# Patient Record
Sex: Female | Born: 1945 | Race: White | Hispanic: No | Marital: Single | Smoking: Former smoker
Health system: Southern US, Community
[De-identification: ages and names within clinical notes are randomized; demographics above are authoritative.]

## PROBLEM LIST (undated history)

## (undated) DIAGNOSIS — E039 Hypothyroidism, unspecified: Secondary | ICD-10-CM

## (undated) DIAGNOSIS — E785 Hyperlipidemia, unspecified: Secondary | ICD-10-CM

## (undated) DIAGNOSIS — I10 Essential (primary) hypertension: Secondary | ICD-10-CM

## (undated) DIAGNOSIS — F32A Depression, unspecified: Secondary | ICD-10-CM

## (undated) DIAGNOSIS — F419 Anxiety disorder, unspecified: Secondary | ICD-10-CM

## (undated) DIAGNOSIS — F329 Major depressive disorder, single episode, unspecified: Secondary | ICD-10-CM

## (undated) DIAGNOSIS — E119 Type 2 diabetes mellitus without complications: Secondary | ICD-10-CM

## (undated) DIAGNOSIS — D649 Anemia, unspecified: Secondary | ICD-10-CM

## (undated) DIAGNOSIS — J45909 Unspecified asthma, uncomplicated: Secondary | ICD-10-CM

## (undated) HISTORY — PX: OTHER SURGICAL HISTORY: SHX169

## (undated) HISTORY — PX: APPENDECTOMY: SHX54

## (undated) HISTORY — PX: TONSILLECTOMY: SUR1361

---

## 2004-08-14 DIAGNOSIS — I10 Essential (primary) hypertension: Secondary | ICD-10-CM | POA: Insufficient documentation

## 2004-08-14 DIAGNOSIS — E039 Hypothyroidism, unspecified: Secondary | ICD-10-CM | POA: Insufficient documentation

## 2004-11-17 ENCOUNTER — Ambulatory Visit: Payer: Self-pay | Admitting: Internal Medicine

## 2004-12-13 ENCOUNTER — Ambulatory Visit: Payer: Self-pay | Admitting: Internal Medicine

## 2005-01-13 ENCOUNTER — Ambulatory Visit: Payer: Self-pay | Admitting: Internal Medicine

## 2005-05-23 ENCOUNTER — Ambulatory Visit: Payer: Self-pay | Admitting: Internal Medicine

## 2005-06-15 ENCOUNTER — Ambulatory Visit: Payer: Self-pay | Admitting: Internal Medicine

## 2005-08-30 ENCOUNTER — Ambulatory Visit: Payer: Self-pay | Admitting: Nurse Practitioner

## 2006-04-16 ENCOUNTER — Ambulatory Visit: Payer: Self-pay | Admitting: *Deleted

## 2006-08-20 DIAGNOSIS — F329 Major depressive disorder, single episode, unspecified: Secondary | ICD-10-CM | POA: Insufficient documentation

## 2007-07-11 ENCOUNTER — Ambulatory Visit: Payer: Self-pay

## 2013-06-25 DIAGNOSIS — E669 Obesity, unspecified: Secondary | ICD-10-CM | POA: Insufficient documentation

## 2013-10-24 ENCOUNTER — Inpatient Hospital Stay: Payer: Self-pay | Admitting: Internal Medicine

## 2013-10-24 LAB — CBC WITH DIFFERENTIAL/PLATELET
Basophil #: 0 10*3/uL (ref 0.0–0.1)
Basophil %: 0.1 %
Eosinophil #: 0 10*3/uL (ref 0.0–0.7)
Eosinophil %: 0 %
HCT: 34.7 % — ABNORMAL LOW (ref 35.0–47.0)
HGB: 12.5 g/dL (ref 12.0–16.0)
Lymphocyte #: 1.4 10*3/uL (ref 1.0–3.6)
Lymphocyte %: 5.9 %
MCH: 28.6 pg (ref 26.0–34.0)
MCHC: 35.9 g/dL (ref 32.0–36.0)
MCV: 80 fL (ref 80–100)
Monocyte #: 1.6 x10 3/mm — ABNORMAL HIGH (ref 0.2–0.9)
Monocyte %: 6.9 %
Neutrophil #: 20.2 10*3/uL — ABNORMAL HIGH (ref 1.4–6.5)
Neutrophil %: 87.1 %
Platelet: 328 10*3/uL (ref 150–440)
RBC: 4.36 10*6/uL (ref 3.80–5.20)
RDW: 13.6 % (ref 11.5–14.5)
WBC: 23.2 10*3/uL — ABNORMAL HIGH (ref 3.6–11.0)

## 2013-10-24 LAB — URINALYSIS, COMPLETE
Bacteria: NONE SEEN
Bilirubin,UR: NEGATIVE
Blood: NEGATIVE
Glucose,UR: 50 mg/dL (ref 0–75)
Leukocyte Esterase: NEGATIVE
Nitrite: NEGATIVE
Ph: 6 (ref 4.5–8.0)
Protein: >=500
RBC,UR: 6 /HPF (ref 0–5)
Specific Gravity: 1.018 (ref 1.003–1.030)
Squamous Epithelial: <1
WBC UR: 3 /HPF (ref 0–5)

## 2013-10-24 LAB — TSH: Thyroid Stimulating Horm: 1.31 u[IU]/mL

## 2013-10-24 LAB — COMPREHENSIVE METABOLIC PANEL
Albumin: 4 g/dL (ref 3.4–5.0)
Alkaline Phosphatase: 62 U/L
Anion Gap: 8 (ref 7–16)
BUN: 10 mg/dL (ref 7–18)
Bilirubin,Total: 1.2 mg/dL — ABNORMAL HIGH (ref 0.2–1.0)
Calcium, Total: 8.1 mg/dL — ABNORMAL LOW (ref 8.5–10.1)
Chloride: 65 mmol/L — ABNORMAL LOW (ref 98–107)
Co2: 30 mmol/L (ref 21–32)
Creatinine: 0.66 mg/dL (ref 0.60–1.30)
EGFR (African American): >60
EGFR (Non-African Amer.): >60
Glucose: 146 mg/dL — ABNORMAL HIGH (ref 65–99)
Osmolality: 212 (ref 275–301)
Potassium: 2.7 mmol/L — ABNORMAL LOW (ref 3.5–5.1)
SGOT(AST): 61 U/L — ABNORMAL HIGH (ref 15–37)
SGPT (ALT): 56 U/L (ref 12–78)
Sodium: 103 mmol/L — CL (ref 136–145)
Total Protein: 7.2 g/dL (ref 6.4–8.2)

## 2013-10-24 LAB — LIPASE, BLOOD: Lipase: 663 U/L — ABNORMAL HIGH (ref 73–393)

## 2013-10-24 LAB — CK: CK, Total: 962 U/L — ABNORMAL HIGH (ref 21–215)

## 2013-10-24 LAB — TROPONIN I: Troponin-I: <0.02 ng/mL

## 2013-10-24 LAB — MAGNESIUM: Magnesium: 1 mg/dL — ABNORMAL LOW

## 2013-10-25 LAB — CBC WITH DIFFERENTIAL/PLATELET
Basophil #: 0.1 10*3/uL (ref 0.0–0.1)
Basophil %: 0.4 %
Eosinophil #: 0.1 10*3/uL (ref 0.0–0.7)
Eosinophil %: 0.3 %
HCT: 32.1 % — ABNORMAL LOW (ref 35.0–47.0)
HGB: 11.6 g/dL — ABNORMAL LOW (ref 12.0–16.0)
Lymphocyte #: 1.6 10*3/uL (ref 1.0–3.6)
Lymphocyte %: 9.6 %
MCH: 28.7 pg (ref 26.0–34.0)
MCHC: 36.1 g/dL — ABNORMAL HIGH (ref 32.0–36.0)
MCV: 80 fL (ref 80–100)
Monocyte #: 1.7 x10 3/mm — ABNORMAL HIGH (ref 0.2–0.9)
Monocyte %: 10 %
Neutrophil #: 13.7 10*3/uL — ABNORMAL HIGH (ref 1.4–6.5)
Neutrophil %: 79.7 %
Platelet: 302 10*3/uL (ref 150–440)
RBC: 4.04 10*6/uL (ref 3.80–5.20)
RDW: 13.9 % (ref 11.5–14.5)
WBC: 17.2 10*3/uL — ABNORMAL HIGH (ref 3.6–11.0)

## 2013-10-25 LAB — COMPREHENSIVE METABOLIC PANEL
Albumin: 3.3 g/dL — ABNORMAL LOW (ref 3.4–5.0)
Alkaline Phosphatase: 55 U/L
Anion Gap: 12 (ref 7–16)
BUN: 9 mg/dL (ref 7–18)
Bilirubin,Total: 1 mg/dL (ref 0.2–1.0)
Calcium, Total: 7.9 mg/dL — ABNORMAL LOW (ref 8.5–10.1)
Chloride: 71 mmol/L — ABNORMAL LOW (ref 98–107)
Co2: 25 mmol/L (ref 21–32)
Creatinine: 0.65 mg/dL (ref 0.60–1.30)
EGFR (African American): >60
EGFR (Non-African Amer.): >60
Glucose: 117 mg/dL — ABNORMAL HIGH (ref 65–99)
Osmolality: 220 (ref 275–301)
Potassium: 3.4 mmol/L — ABNORMAL LOW (ref 3.5–5.1)
SGOT(AST): 75 U/L — ABNORMAL HIGH (ref 15–37)
SGPT (ALT): 50 U/L (ref 12–78)
Sodium: 108 mmol/L — CL (ref 136–145)
Total Protein: 6.4 g/dL (ref 6.4–8.2)

## 2013-10-25 LAB — SODIUM
Sodium: 104 mmol/L — CL (ref 136–145)
Sodium: 109 mmol/L — CL (ref 136–145)
Sodium: 109 mmol/L — CL (ref 136–145)
Sodium: 110 mmol/L — CL (ref 136–145)
Sodium: 112 mmol/L — CL (ref 136–145)

## 2013-10-25 LAB — LIPASE, BLOOD: Lipase: 644 U/L — ABNORMAL HIGH (ref 73–393)

## 2013-10-25 LAB — OSMOLALITY, URINE: Osmolality: 138 mOsm/kg

## 2013-10-25 LAB — OSMOLALITY: Osmolality: 228 mOsm/kg — CL (ref 280–301)

## 2013-10-25 LAB — URIC ACID: Uric Acid: 3 mg/dL (ref 2.6–6.0)

## 2013-10-26 LAB — CBC WITH DIFFERENTIAL/PLATELET
Basophil #: 0 10*3/uL (ref 0.0–0.1)
Basophil %: 0.5 %
Eosinophil #: 0 10*3/uL (ref 0.0–0.7)
Eosinophil %: 0.3 %
HCT: 35.3 % (ref 35.0–47.0)
HGB: 12.2 g/dL (ref 12.0–16.0)
Lymphocyte #: 2.1 10*3/uL (ref 1.0–3.6)
Lymphocyte %: 19.6 %
MCH: 28.7 pg (ref 26.0–34.0)
MCHC: 34.6 g/dL (ref 32.0–36.0)
MCV: 83 fL (ref 80–100)
Monocyte #: 1.2 x10 3/mm — ABNORMAL HIGH (ref 0.2–0.9)
Monocyte %: 11.5 %
Neutrophil #: 7.2 10*3/uL — ABNORMAL HIGH (ref 1.4–6.5)
Neutrophil %: 68.1 %
Platelet: 294 10*3/uL (ref 150–440)
RBC: 4.25 10*6/uL (ref 3.80–5.20)
RDW: 14.2 % (ref 11.5–14.5)
WBC: 10.6 10*3/uL (ref 3.6–11.0)

## 2013-10-26 LAB — BASIC METABOLIC PANEL
Anion Gap: 3 — ABNORMAL LOW (ref 7–16)
BUN: 6 mg/dL — ABNORMAL LOW (ref 7–18)
Calcium, Total: 8 mg/dL — ABNORMAL LOW (ref 8.5–10.1)
Chloride: 86 mmol/L — ABNORMAL LOW (ref 98–107)
Co2: 26 mmol/L (ref 21–32)
Creatinine: 0.68 mg/dL (ref 0.60–1.30)
EGFR (African American): >60
EGFR (Non-African Amer.): >60
Glucose: 194 mg/dL — ABNORMAL HIGH (ref 65–99)
Osmolality: 236 (ref 275–301)
Potassium: 3.8 mmol/L (ref 3.5–5.1)
Sodium: 115 mmol/L — CL (ref 136–145)

## 2013-10-26 LAB — SODIUM
Sodium: 123 mmol/L — ABNORMAL LOW (ref 136–145)
Sodium: 121 mmol/L — ABNORMAL LOW (ref 136–145)
Sodium: 124 mmol/L — ABNORMAL LOW (ref 136–145)
Sodium: 123 mmol/L — ABNORMAL LOW (ref 136–145)

## 2013-10-26 LAB — MAGNESIUM: Magnesium: 3.5 mg/dL — ABNORMAL HIGH

## 2013-10-27 LAB — SODIUM
Sodium: 123 mmol/L — ABNORMAL LOW (ref 136–145)
Sodium: 124 mmol/L — ABNORMAL LOW (ref 136–145)
Sodium: 124 mmol/L — ABNORMAL LOW (ref 136–145)
Sodium: 125 mmol/L — ABNORMAL LOW (ref 136–145)
Sodium: 125 mmol/L — ABNORMAL LOW (ref 136–145)
Sodium: 126 mmol/L — ABNORMAL LOW (ref 136–145)

## 2013-10-27 LAB — CLOSTRIDIUM DIFFICILE(ARMC)

## 2013-10-28 LAB — SODIUM
Sodium: 128 mmol/L — ABNORMAL LOW (ref 136–145)
Sodium: 129 mmol/L — ABNORMAL LOW (ref 136–145)

## 2013-10-29 LAB — BASIC METABOLIC PANEL
Anion Gap: 2 — ABNORMAL LOW (ref 7–16)
Anion Gap: 4 — ABNORMAL LOW (ref 7–16)
BUN: 10 mg/dL (ref 7–18)
BUN: 9 mg/dL (ref 7–18)
Calcium, Total: 8.9 mg/dL (ref 8.5–10.1)
Calcium, Total: 9 mg/dL (ref 8.5–10.1)
Chloride: 96 mmol/L — ABNORMAL LOW (ref 98–107)
Chloride: 97 mmol/L — ABNORMAL LOW (ref 98–107)
Co2: 28 mmol/L (ref 21–32)
Co2: 28 mmol/L (ref 21–32)
Creatinine: 0.78 mg/dL (ref 0.60–1.30)
Creatinine: 0.79 mg/dL (ref 0.60–1.30)
EGFR (African American): >60
EGFR (African American): >60
EGFR (Non-African Amer.): >60
EGFR (Non-African Amer.): >60
Glucose: 168 mg/dL — ABNORMAL HIGH (ref 65–99)
Glucose: 97 mg/dL (ref 65–99)
Osmolality: 256 (ref 275–301)
Osmolality: 258 (ref 275–301)
Potassium: 5.1 mmol/L (ref 3.5–5.1)
Potassium: 4.3 mmol/L (ref 3.5–5.1)
Sodium: 126 mmol/L — ABNORMAL LOW (ref 136–145)
Sodium: 129 mmol/L — ABNORMAL LOW (ref 136–145)

## 2013-10-29 LAB — STOOL CULTURE

## 2013-11-13 DIAGNOSIS — E871 Hypo-osmolality and hyponatremia: Secondary | ICD-10-CM | POA: Insufficient documentation

## 2014-12-28 ENCOUNTER — Ambulatory Visit: Payer: Self-pay | Admitting: Internal Medicine

## 2015-02-05 NOTE — H&P (Signed)
PATIENT NAME:  Pamela Smith, Pamela Smith MR#:  [id number] DATE OF BIRTH:  09-24-1946  DATE OF ADMISSION:  10/24/2013  PRIMARY CARE PHYSICIAN: Dr. Windell Moulding at [place]:  The patient is a 69 year old Caucasian female with past medical history significant for history of hypertension, hyperlipidemia, history of hypothyroidism, as well as diabetes, who presented to the hospital with complaints of nausea, vomiting, as well as diarrhea. The patient herself is not able to provide much history. However, apparently she was brought because of nausea, vomiting and diarrhea. She tells me that she does not remember having diarrhea; however, she has been having problems with nausea, vomiting for the past 3 or 4 days now. She denies any fevers; however, she is not able to eat. She was able to drink some fluids. She lives alone. On arrival to the Emergency Room, she was confused and noted to have abnormal labs, with sodium level of 103 as well as potassium of 2.7 and magnesium level of 1.0. Hospital services were contacted for admission.   PAST MEDICAL HISTORY: Significant for history of hypertension, hyperlipidemia, gastritis on EGD in 2005, history of diabetes mellitus since 2003, history of hypothyroidism, questionable depression.   SURGICAL HISTORY: Appendectomy as well as tonsillectomy.   MEDICATIONS:  1.  Aspirin 81 mg p.o. daily. 2.  Fish oil 1 gram daily. 3.  Calcium 600 mg, unknown frequency. 4.  Metformin 1 gram twice daily. 5.  Januvia 100 mg p.o. daily. 6.   Lantus 40 units twice daily. 7.  Felodipine 10 mg p.o. daily. 8.  Chlorthalidone 25 mg p.o. daily. 9.  Citalopram 40 mg p.o. daily. 10.  Atorvastatin 40 mg p.o. daily. 11.  Levothyroxine 75 mcg p.o. daily. 12.  Multivitamins once daily. 13.  Acidophilus 1 capsule once daily.   ALLERGIES:  No known drug allergies.  FAMILY HISTORY: Unable to obtain, as patient is confused.   SOCIAL HISTORY: The patient lives alone. Denies  any tobacco or alcohol abuse.   REVIEW OF SYSTEMS:  Difficult to obtain. However, patient denies any chills. Admits to weakness. She is not sure about weight. She has been having nausea, vomiting; however, is not sure about diarrhea. She denies any abdominal pains. Denies any hematemesis or rectal bleeding. Denies any black stools.  GENERAL: The patient, otherwise, denies any fevers, chills. Admits to fatigue, weakness. Denies any pains, weight loss or gain.  EYES: Denies any blurry vision, double vision, glaucoma, cataracts.  ENT: Denies any tinnitus, allergies, epistaxis, sinus pain, dentures or difficulty swallowing. RESPIRATORY: Denies any cough, wheezes, asthma, COPD.   CARDIOVASCULAR: Denies chest pain, orthopnea, edema, arrhythmias, palpitations or syncope.  GASTROINTESTINAL: Denies any hematemesis, rectal bleeding, change in bowel habits.  GENITOURINARY: Denies dysuria, hematuria, frequency, incontinence.  ENDOCRINOLOGIC: Denies any polydypsia, nocturia, thyroid problems, heat or cold intolerance or thirst.  HEMATOLOGIC: Denies anemia, easy bruising, bleeding or swollen glands.  SKIN: Denies  acne, rashes, change in moles.  MUSCULOSKELETAL: Denies arthritis, cramps, swelling, gout.  NEUROLOGIC: Denies epilepsy or tremors.  PSYCHIATRIC: Denies anxiety, insomnia.   PHYSICAL EXAMINATION: VITAL SIGNS: On arrival to the hospital, temperature is 97.7, pulse was 84, respiration rate was 22, blood pressure 195/84, saturation was 98% on room air.  GENERAL: This is a well-developed, well-nourished, pale, very uncomfortable Caucasian female sitting on the stretcher.  HEENT: Pupils are equal and react to light. Extraocular movements intact.. No icterus or conjunctivitis.  The patient does have difficulty hearing. No pharyngeal erythema. Mucosa is dry.  NECK:  No masses. Supple, nontender. Thyroid not enlarged. No adenopathy. No JVD or carotid bruits bilaterally. Full range of motion.  LUNGS: Clear  to auscultation in all fields. No rales, rhonchi, diminished breath sounds or wheezing. No labored inspirations, increased effort, dullness to percussion, overt respiratory distress.  CARDIOVASCULAR: S1, S2 appreciated. No murmurs, gallops or rubs were noted. Rythm is regular. PMI not lateralized. Chest is nontender to palpation. Pedal pulses 1+. No lower extremity edema, calf tenderness or cyanosis was noted.  ABDOMEN: Soft, nontender. Bowel sounds are present. No hepatosplenomegaly or masses were noted.  RECTAL: Deferred.  MUSCLE STRENGTH: Able to move extremities. However, has difficulty even sitting up in the bed, leaning forward because of significant weakness. I am not able to assess her gait.  No changes of digits or nails.  EXTREMITIES: She is able to move. No tenderness or effusion. Range of motion is normal. Strength and tone is somewhat diminished.  SKIN: Did not reveal any rashes, lesions, erythema, nodularity or induration. The patient is diaphoretic. No wounds. No other abnormalities were found.  LYMPH NODES: Cervical lymph nodes were not enlarged. No adenopathy otherwise. Peripheral pulses were palpable bilaterally.  NEUROLOGICAL: Cranial nerves grossly intact. Sensory is intact. No dysarthria or aphasia. However, patient is somewhat confused, not able to answer my questions and not able to follow commands appropriately.   LABORATORY DATA: BMP showed a glucose of 146, sodium 103, potassium 2.7, chloride 65, calcium 8.1, magnesium 1.0. Lipase level elevated at 663. Liver enzymes: Total bilirubin was 1.2, AST 61, otherwise unremarkable. The patient's cardiac enzymes, troponin was less than 0.02. TSH normal at 1.31. White blood cell count is elevated at 23.2, hemoglobin was 12.5, platelet count was 328, absolute neutrophil count is high at 20.2. Venous blood:  PH was 7.51, pCO2 was 39. Lactic acid 0.9.   ASSESSMENT AND PLAN: 1.  Hyponatremia. Likely due to chlorthalidone as well as  dehydration. Will continue patient on IV fluids for now. Will reassess sodium level in the morning.  2.  Metabolic encephalopathy. Likely related to hyponatremia. Follow up with sodium repletion, neuro checks.  3.  Hypertension, malignant. Resume patient's outpatient medications, except chlorthalidone.  4.  Elevated transaminases. Get ultrasound of abdomen. Get CK level checked and hold atorvastatin for now.  5.  Hypothyroidism. Resume levothyroxine.  6.  Diabetes mellitus. Will resume low dose of Lantus as well as sliding scale insulin. The patient will be n.p.o. with some ice chips.  7. Elevation of lipase. Questionable due to nausea, vomiting versus some inflammation of pancreas. Will keep patient n.p.o. until tomorrow. Will reassess lipase level, and will initiate her on fluids orally if her lipase is normal.  8.  Leukocytosis. Possibly stress related. Get urinalysis. Will also get stool cultures if patient does have diarrhea.   TIME SPENT:  50 minutes.    ____________________________ Theodoro Grist, MD rv:mr D: 10/24/2013 19:48:56 ET T: 10/24/2013 21:19:51 ET JOB#: [id number]  cc: Theodoro Grist, MD, <Dictator> DR. Windell Moulding AT [facility] [place] MD ELECTRONICALLY SIGNED 11/06/2013 14:00

## 2015-02-05 NOTE — Consult Note (Signed)
Brief Consult Note: Diagnosis: MDD Moderate, Alcohol Abuse.   Patient was seen by consultant.   Recommend further assessment or treatment.   Orders entered.   Discussed with Attending MD.   Comments: Pt seen on the medical floor as a follow up. She stated that she has been drinking 2 vodka on a daily basis and was minimizing her use of alcohol. She stated that she was following a PCP in [facility] and she was prescribing her Celexa and she was prescribed 40mg  as she feeling depressed She wants a lower cost medication as she thinks that she cannot afford a higher copay medication. Pt stated that she has been the same medication for a long time. She has a recent adjustment of her BP meds which might have contributed to her hyponatremia and she is willing to have her Antidepressants adjusted at this time. She denied Si/HI or plans.  Plan: Decrease Celexa 20mg  po qhs.  Add Buspar 5mg  po BID Case Discussed with her treating physician.  Electronic Signatures: Jeronimo Norma (MD)  (Signed 13-Jan-15 16:07)  Authored: Brief Consult Note   Last Updated: 13-Jan-15 16:07 by Jeronimo Norma (MD)

## 2015-02-05 NOTE — Consult Note (Signed)
PATIENT NAME:  Pamela Smith, Pamela Smith MR#:  [id number] DATE OF BIRTH:  1945-11-23  NEPHROLOGY CONSULTATION DATE OF CONSULTATION:  10/25/2013  REFERRING PHYSICIAN: Theodoro Grist, MD  CONSULTING PHYSICIAN:  Vickie Ponds Lilian Kapur, MD  REASON FOR CONSULTATION: Hyponatremia.   HISTORY OF PRESENT ILLNESS: The patient is a 69 year old Caucasian female with past medical history of hypertension, diabetes mellitus, hyperlipidemia, hypothyroidism, anxiety/depression who presented to [facility] yesterday with nausea, vomiting and confusion. The patient is a poor historian and unable to offer exact details regarding her present illness. We actually received a call late yesterday from the Emergency Department regarding this patient's care. We were notified that she had very low blood sodium of 103 and we suggested that this issue be corrected slowly as it appears that there is some element of chronicity. According to the Emergency Room physician, the patient's family had stated that she has had confusion over the past several weeks which has become worse. When she presented, she had a serum sodium of 103 with a potassium of 2.7. The patient tells me that she has had rather poor p.o. intake over the past week and earlier this week had only a piece of toast to eat. She also relates that she has been drinking alcohol in the form of vodka, but cannot quantify exactly how much. After being started on IV fluid hydration with 0.9 normal saline her serum sodium is now up to 110. No reported seizure activity. Serum potassium is up to 3.4 this a.m. Urine osmolality was quite low at 138, and blood osmolality was found to be 228. Apparently the patient was also on chlorthalidone at home.   PAST MEDICAL HISTORY: 1. Hypertension.  2. Hyperlipidemia.  3. Hypothyroidism.  4. Diabetes mellitus.  5. Anxiety/depression.   ALLERGIES: No known drug allergies.   HOME MEDICATIONS: Include:  1. Multivitamin 1 tablet p.o.  daily.  2. Metformin 1000 mg p.o. b.i.d.  3. Levothyroxine 75 mcg p.o. daily.  4. Lantus 40 units subcutaneous b.i.d.  5. Januvia 100 mg p.o. daily.  6. Fish oil 1 capsule p.o. daily.  7. Felodipine 10 mg p.o. daily.  8. Citalopram 40 mg p.o. daily.  9. Chlorthalidone 25 mg p.o. daily.  10. Lipitor 40 mg p.o. daily.  11. Aspirin 81 mg p.o. daily.  12. Acidophilus 1 capsule p.o. daily   SOCIAL HISTORY: The patient lives in [place] per the medical record. She states that she is divorced. She reports she quit smoking in the 1990s. States that she drinks alcohol in the form of vodka several days a week. She denies illicit drug use.   FAMILY HISTORY: Father deceased and had history of coronary artery disease.   REVIEW OF SYSTEMS: Unable to obtain reliable review of systems from the patient. The patient stated she did not know the answer to many of the posed review of systems questions.   PHYSICAL EXAMINATION: VITAL SIGNS: Temperature 98, pulse 82, respirations 33, blood pressure 148/62.  GENERAL: Disheveled appearing female, currently in no acute distress.  HEENT: Normocephalic, atraumatic. Extraocular movements are intact. Pupils equal, round, react to light. No scleral icterus. Conjunctivae are pink. No epistaxis noted. Gross hearing intact. Oral mucosa moist.  NECK: Supple without JVD or lymphadenopathy.  LUNGS: Clear to auscultation bilaterally with normal respiratory effort.  HEART: S1, S2 regular rate and rhythm. No murmurs, rubs, or gallops appreciated.  ABDOMEN: Soft, nontender, nondistended. Bowel sounds positive. No rebound or guarding. No gross organomegaly  appreciated. EXTREMITIES:   No clubbing,  cyanosis, or edema.  NEUROLOGIC: The patient is awake and alert and oriented to time, and place as well as self at present. She does follow simple commands. However, when more detailed questions are asked the patient was unable to concentrate on questions.  GENITOURINARY: No  suprapubic tenderness is noted at this time.  SKIN: Warm and dry. No rashes noted.  MUSCULOSKELETAL: No joint redness, swelling or tenderness appreciated.  PSYCHIATRIC: The patient with rather flat and depressed affect and has poor insight into her current illness.   LABORATORY DATA: Upon presentation, sodium was 103, potassium 2.7, chloride 65, CO2 30, BUN 10, creatinine 0.6, glucose 146, calcium 8.1, magnesium 1.0, lipase 663. CMP this morning shows sodium of 108, chloride 71, BUN 9, creatinine 0.65 and glucose 117, lipase 664. Blood osmolality 228, total protein 6.4, albumin 3.3, total bilirubin 1.0, alkaline phosphatase 55, AST 75, and ALT 50. CBC shows WBC 17.2, hemoglobin 11.6, hematocrit 32, platelets 302. Urine osmolality is 138. Urinalysis shows urine protein of 500 mg/dL, negative nitrites, negative leukocyte esterase, 6 RBCs per high-power field, 3 WBCs per high-power field. Abdominal ultrasound shows fatty liver. No acute abnormality noted, however.   IMPRESSION: This is a 69 year old Caucasian female with past medical history of hypertension, diabetes mellitus, hyperlipidemia, hypothyroidism, anxiety/depression presented to [facility] with nausea, vomiting, poor p.o. intake and severe hyponatremia.  1. Acute hyponatremia. Serum sodium 103 upon presentation.  2. Altered mental status.  3. Hypokalemia.  4. Hypothyroidism.   PLAN: The patient was unable to offer reliable history; however, she presented with significant hyponatremia. Apparently she had been having nausea and vomiting at home and was also noted to be on chlorthalidone. She has hypothyroidism. However, this appears to be relatively well controlled at present. She was started on IV fluid hydration and currently her serum sodium is up to 110. We will continue 0.9 normal saline at present; however we will decrease the rate slightly to 100 mL per hour. We recommend ongoing monitoring of the serum sodium. We  would recommend trying to increase the serum sodium by 10 mEq/L per 24 hours. It appears that she may have a history of significant alcohol use as well. This was discussed with Dr. Verdell Carmine and would certainly monitor the patient for delirium tremens. We will also add D5 to her normal saline and add thiamine and folate to her medication regimen as well. I would like to thank Dr. Ether Griffins for this kind referral. Further plan as the patient progresses.  ____________________________ Tama High, MD mnl:sg D: 10/25/2013 12:46:00 ET T: 10/25/2013 13:45:41 ET JOB#: [id number]  cc: Tama High, MD, <Dictator> Mariah Milling Crystalynn Mcinerney MD ELECTRONICALLY SIGNED 11/19/2013 12:02

## 2015-02-05 NOTE — Discharge Summary (Signed)
PATIENT NAME:  Pamela Smith, EBEL MR#:  [id number] DATE OF BIRTH:  11/01/1945  DATE OF ADMISSION:  10/24/2013 DATE OF DISCHARGE:  10/29/2013  DISCHARGING PHYSICIAN: Gladstone Lighter, M.D.   PRIMARY CARE PHYSICIAN: At [facility].   CONSULTATIONS IN THE HOSPITAL: Nephrology consultation by Dr. Anthonette Legato.  Psych consultation with Dr. Gretel Acre     DISCHARGE DIAGNOSES: 1. Acute metabolic encephalopathy.  2. Acute hyponatremia, hypovolemic and also beer potomania 3. Hypertension.  4. Hyperlipidemia.  5. Gastritis.  6. Diabetes mellitus.  7. Hypothyroidism.  8. Depression and anxiety.  9. Acute viral gastroenteritis.   DISCHARGE HOME MEDICATIONS:  1. Fish oil 1 gram capsule p.o. daily.  2. Aspirin 81 mg p.o. daily.  3. Felodipine 10 mg p.o. daily.  4. Atorvastatin 40 mg p.o. daily at bedtime.  5. Levothyroxine 75 mcg p.o. daily.  6. Multivitamin 1 tablet p.o. daily.  7. Acidophilus probiotic capsule daily.  8. Celexa 20 mg p.o. daily.  9. Insulin Levemir  40 units subcutaneous once a day.  10. Buspirone  5 mg p.o. b.i.d.  11. Metoprolol 25 mg p.o. b.i.d.   DISCHARGE DIET: ADA 1800 calorie diet.   DISCHARGE ACTIVITY: As tolerated.    FOLLOWUP INSTRUCTIONS: 1. PCP follow-up in 1 to 2 weeks.  2. Physical therapy.  3. Please monitor sugars b.i.d. other home medications were decreased.   LABS AND IMAGING STUDIES PRIOR TO DISCHARGE: Sodium 129, potassium 4.3, chloride 97, bicarbonate 28, BUN 9, creatinine 0.79, glucose 97 and calcium of 9.0.   WBC 10.6, hemoglobin 12.8, hematocrit 33.3, platelet count is 294.   Stool studies for Clostridium difficile and also for cultures have been negative.    Urinalysis negative for any infection. Abdominal ultrasound showing fatty liver, otherwise no acute abnormality.   BRIEF HOSPITAL COURSE: Pamela Smith is a 69 year old Caucasian female with past medical history significant for hypertension, diabetes, hyperlipidemia and hypothyroidism who  presents from home secondary to nausea, vomiting, diarrhea and also weakness, and noted to have a sodium of 103. 1. Acute metabolic encephalopathy secondary to hyponatremia.  2. Hypovolemic hyponatremia, secondary to GI losses from nausea, vomiting and diarrhea. Also she has underlying history of drinking beer, so it could be a combination of both. She was only given normal saline in the hospital with volume resuscitation. Her sodium has improved and she is at 129. Her mental status has improved significantly, and she is at baseline. She was also seen by nephrology. She was not given any hypertonic saline or conivaptan  in the hospital.  Also she was taking chlorthalidone at home which is being discontinued at the time of discharge.  2. Acute gastroenteritis likely viral gastroenteritis presents with sudden onset. She also had elevated transaminases. Ultrasound was negative. She was treated conservatively without any antibiotics and that improved her symptoms,  3. Diabetes mellitus. She was on metformin, Januvia and Lantus at home. The dose was decreased to 40 units of Lantus and metformin, Januvia were held and her sugars are appropriately well controlled now. However, she needs twice a day fingersticks and needs to add her medications if her sugars were going up.  4. Hypertension. Her chlorthalidone was stopped at the time of discharge and medications were adjusted and she is being discharged on felodipine and  metoprolol.  5. Depression and anxiety. The patient on Celexa and BuSpar. Also, she had a psychiatric consult while in the hospital.  Her course has been otherwise uneventful in the hospital. Physical therapy worked with the patient and recommended  rehab.   DISCHARGE CONDITION: Stable.   DISCHARGE DISPOSITION: To short-term rehab.   TIME SPENT ON DISCHARGE: 45 minutes.  ____________________________ Gladstone Lighter, MD rk:sg D: 10/29/2013 12:09:00 ET T: 10/29/2013 12:20:58  ET JOB#: [id number]  cc: Gladstone Lighter, MD, <Dictator> Gladstone Lighter MD ELECTRONICALLY SIGNED 10/29/2013 14:16

## 2015-02-05 NOTE — Consult Note (Signed)
PATIENT NAME:  Pamela Smith, Pamela Smith MR#:  [id number] DATE OF BIRTH:  1946/10/04  DATE OF ADMISSION:  10/24/2013 DATE OF CONSULTATION:  10/26/2013  CONSULTING PHYSICIAN:  Karalee Hauter K. Emmaly Leech, MD  AGE:  69 years.  SEX:  Female.   RACE:  White.  SUBJECTIVE:  The patient was seen in consultation for depression.   The patient is a 69 year old white female, not employed and is divorced and lives by herself in [place], [place].  The patient has a long history of depression and is being followed at [facility] and has been stabilized on Celexa 40 mg every day which daily helps her.  PAST PSYCHIATRIC HISTORY:  No previous history of inpatient psychiatry. No history of suicidal attempts.    ALCOHOL AND DRUGS:  Has an occasional social drink of alcohol.  Denies street or prescription drug abuse.  Denies smoking nicotine cigarettes, but she used to do it before.  OBJECTIVE:  According to the information obtained from the staff, the patient had been confused for the past 2 days, but today she is much clearer and alert and oriented.  The patient reports the chief complaint that came in was for "not eating right."  The patient reports that she eats mostly yogurt and the day that she came here yogurt was not available at home as she did not have it, so she did not eat, and probably she took her medications without any food.  The patient has diabetes mellitus and is on medications for the same.  The patient realizes that she was confused when she came in but she said that she feels clearer now.  MENTAL STATUS EXAMINATION:  The patient is seen lying comfortable in bed, alert and oriented and she knew where she was and the reason she was here and date and time.  Does admit feeling low and down and depressed at times because of feeling lonely; however, she denies feeling hopeless or helpless and denies being worthless or useless.  Denies any idea or plans to hurt herself or others.  No psychosis.  Coordination is  fair.  Insight and judgement fair.  IMPRESSION:  Major depressive disorder, recurrent, being stabilized on Celexa 40 mg p.o. daily.     RECOMMENDATIONS:  Continue Celexa 40 mg daily and the patient will be followed by her physician for depression in [facility].     ____________________________ Wallace Cullens. Franchot Mimes, MD skc:dmm D: 10/26/2013 16:48:02 ET T: 10/26/2013 19:08:06 ET JOB#: [id number]  cc: Arlyn Leak K. Franchot Mimes, MD, <Dictator> Dewain Penning MD ELECTRONICALLY SIGNED 10/31/2013 15:57

## 2015-03-01 DIAGNOSIS — R55 Syncope and collapse: Secondary | ICD-10-CM | POA: Insufficient documentation

## 2015-03-01 DIAGNOSIS — E782 Mixed hyperlipidemia: Secondary | ICD-10-CM | POA: Insufficient documentation

## 2015-09-01 DIAGNOSIS — D649 Anemia, unspecified: Secondary | ICD-10-CM | POA: Insufficient documentation

## 2015-11-18 ENCOUNTER — Encounter: Payer: Self-pay | Admitting: *Deleted

## 2015-11-21 ENCOUNTER — Encounter
Admission: RE | Disposition: A | Discharge status: Home / Self Care | Payer: Self-pay | Source: Ambulatory Visit | Attending: Gastroenterology

## 2015-11-21 ENCOUNTER — Encounter: Payer: Self-pay | Admitting: *Deleted

## 2015-11-21 ENCOUNTER — Ambulatory Visit: Payer: Medicare Other | Admitting: Certified Registered Nurse Anesthetist

## 2015-11-21 ENCOUNTER — Ambulatory Visit
Admission: RE | Admit: 2015-11-21 | Discharge: 2015-11-21 | Disposition: A | Discharge status: Home / Self Care | Payer: Medicare Other | Source: Ambulatory Visit | Attending: Gastroenterology | Admitting: Gastroenterology

## 2015-11-21 DIAGNOSIS — E669 Obesity, unspecified: Secondary | ICD-10-CM | POA: Diagnosis not present

## 2015-11-21 DIAGNOSIS — Z6829 Body mass index (BMI) 29.0-29.9, adult: Secondary | ICD-10-CM | POA: Insufficient documentation

## 2015-11-21 DIAGNOSIS — K640 First degree hemorrhoids: Secondary | ICD-10-CM | POA: Diagnosis not present

## 2015-11-21 DIAGNOSIS — Z7984 Long term (current) use of oral hypoglycemic drugs: Secondary | ICD-10-CM | POA: Diagnosis not present

## 2015-11-21 DIAGNOSIS — R195 Other fecal abnormalities: Secondary | ICD-10-CM | POA: Diagnosis not present

## 2015-11-21 DIAGNOSIS — I10 Essential (primary) hypertension: Secondary | ICD-10-CM | POA: Insufficient documentation

## 2015-11-21 DIAGNOSIS — Z87891 Personal history of nicotine dependence: Secondary | ICD-10-CM | POA: Insufficient documentation

## 2015-11-21 DIAGNOSIS — F419 Anxiety disorder, unspecified: Secondary | ICD-10-CM | POA: Diagnosis not present

## 2015-11-21 DIAGNOSIS — E039 Hypothyroidism, unspecified: Secondary | ICD-10-CM | POA: Diagnosis not present

## 2015-11-21 DIAGNOSIS — K210 Gastro-esophageal reflux disease with esophagitis: Secondary | ICD-10-CM | POA: Insufficient documentation

## 2015-11-21 DIAGNOSIS — Z794 Long term (current) use of insulin: Secondary | ICD-10-CM | POA: Diagnosis not present

## 2015-11-21 DIAGNOSIS — D509 Iron deficiency anemia, unspecified: Secondary | ICD-10-CM | POA: Insufficient documentation

## 2015-11-21 DIAGNOSIS — E785 Hyperlipidemia, unspecified: Secondary | ICD-10-CM | POA: Insufficient documentation

## 2015-11-21 DIAGNOSIS — J45909 Unspecified asthma, uncomplicated: Secondary | ICD-10-CM | POA: Insufficient documentation

## 2015-11-21 DIAGNOSIS — F329 Major depressive disorder, single episode, unspecified: Secondary | ICD-10-CM | POA: Diagnosis not present

## 2015-11-21 DIAGNOSIS — D175 Benign lipomatous neoplasm of intra-abdominal organs: Secondary | ICD-10-CM | POA: Diagnosis not present

## 2015-11-21 DIAGNOSIS — E119 Type 2 diabetes mellitus without complications: Secondary | ICD-10-CM | POA: Insufficient documentation

## 2015-11-21 DIAGNOSIS — K2940 Chronic atrophic gastritis without bleeding: Secondary | ICD-10-CM | POA: Diagnosis not present

## 2015-11-21 DIAGNOSIS — Z79899 Other long term (current) drug therapy: Secondary | ICD-10-CM | POA: Diagnosis not present

## 2015-11-21 HISTORY — DX: Anemia, unspecified: D64.9

## 2015-11-21 HISTORY — PX: ESOPHAGOGASTRODUODENOSCOPY (EGD) WITH PROPOFOL: SHX5813

## 2015-11-21 HISTORY — DX: Hyperlipidemia, unspecified: E78.5

## 2015-11-21 HISTORY — DX: Essential (primary) hypertension: I10

## 2015-11-21 HISTORY — DX: Major depressive disorder, single episode, unspecified: F32.9

## 2015-11-21 HISTORY — PX: COLONOSCOPY WITH PROPOFOL: SHX5780

## 2015-11-21 HISTORY — DX: Unspecified asthma, uncomplicated: J45.909

## 2015-11-21 HISTORY — DX: Anxiety disorder, unspecified: F41.9

## 2015-11-21 HISTORY — DX: Hypothyroidism, unspecified: E03.9

## 2015-11-21 HISTORY — DX: Type 2 diabetes mellitus without complications: E11.9

## 2015-11-21 HISTORY — DX: Depression, unspecified: F32.A

## 2015-11-21 LAB — GLUCOSE, CAPILLARY: Glucose-Capillary: 150 mg/dL — ABNORMAL HIGH (ref 65–99)

## 2015-11-21 SURGERY — COLONOSCOPY WITH PROPOFOL
Anesthesia: General

## 2015-11-21 MED ORDER — SODIUM CHLORIDE 0.9 % IV SOLN
INTRAVENOUS | Status: DC
  Administered 2015-11-21: 1000 mL via INTRAVENOUS

## 2015-11-21 MED ORDER — PROPOFOL 10 MG/ML IV BOLUS
INTRAVENOUS | Status: DC | PRN
  Administered 2015-11-21: 40 mg via INTRAVENOUS
  Administered 2015-11-21: 10 mg via INTRAVENOUS

## 2015-11-21 MED ORDER — EPHEDRINE SULFATE 50 MG/ML IJ SOLN
INTRAMUSCULAR | Status: DC | PRN
  Administered 2015-11-21: 5 mg via INTRAVENOUS

## 2015-11-21 MED ORDER — LIDOCAINE HCL (CARDIAC) 20 MG/ML IV SOLN
INTRAVENOUS | Status: DC | PRN
  Administered 2015-11-21: 100 mg via INTRAVENOUS

## 2015-11-21 MED ORDER — GLYCOPYRROLATE 0.2 MG/ML IJ SOLN
INTRAMUSCULAR | Status: DC | PRN
  Administered 2015-11-21: 0.2 mg via INTRAVENOUS

## 2015-11-21 MED ORDER — PROPOFOL 500 MG/50ML IV EMUL
INTRAVENOUS | Status: DC | PRN
  Administered 2015-11-21: 100 ug/kg/min via INTRAVENOUS

## 2015-11-21 MED ORDER — FENTANYL CITRATE (PF) 100 MCG/2ML IJ SOLN
INTRAMUSCULAR | Status: DC | PRN
  Administered 2015-11-21: 50 ug via INTRAVENOUS

## 2015-11-21 NOTE — Anesthesia Postprocedure Evaluation (Signed)
Anesthesia Post Note  Patient: Pamela Smith  Procedure(s) Performed: Procedure(s) (LRB): COLONOSCOPY WITH PROPOFOL (N/A) ESOPHAGOGASTRODUODENOSCOPY (EGD) WITH PROPOFOL (N/A)  Patient location during evaluation: PACU Anesthesia Type: General Level of consciousness: awake Pain management: pain level controlled Vital Signs Assessment: post-procedure vital signs reviewed and stable Respiratory status: spontaneous breathing Cardiovascular status: blood pressure returned to baseline Postop Assessment: no headache Anesthetic complications: no    Last Vitals:  Filed Vitals:   11/21/15 0923 11/21/15 0930  BP: 122/63 128/67  Pulse:  75  Temp: 36.2 C   Resp: 19 15    Last Pain: There were no vitals filed for this visit.               Luana Tatro M

## 2015-11-21 NOTE — Op Note (Signed)
[facility] Gastroenterology Patient Name: Johnise Odoms Procedure Date: 11/21/2015 8:38 AM MRN: [id number] Account #: [id number] Date of Birth: Feb 15, 1946 Admit Type: Outpatient Age: 70 Room: [facility] ENDO ROOM 2 Gender: Female Note Status: Finalized Procedure:         Upper GI endoscopy Indications:       Iron deficiency anemia, Heme positive stool Patient Profile:   This is a 70 year old female. Providers:         Gerrit Heck. Rayann Heman, MD Referring MD:      Glendon Axe (Referring MD) Medicines:         Propofol per Anesthesia Complications:     No immediate complications. Procedure:         Pre-Anesthesia Assessment:                    - Prior to the procedure, a History and Physical was                     performed, and patient medications, allergies and                     sensitivities were reviewed. The patient's tolerance of                     previous anesthesia was reviewed.                    After obtaining informed consent, the endoscope was passed                     under direct vision. Throughout the procedure, the                     patient's blood pressure, pulse, and oxygen saturations                     were monitored continuously. The Endoscope was introduced                     through the mouth, and advanced to the second part of                     duodenum. The upper GI endoscopy was accomplished without                     difficulty. The patient tolerated the procedure well. Findings:      LA Grade A (one or more mucosal breaks less than 5 mm, not extending       between tops of 2 mucosal folds) esophagitis with no bleeding was found       at the gastroesophageal junction. Biopsies were taken with a cold       forceps for histology.      Diffuse atrophic mucosa was found in the entire examined stomach.       Biopsies were taken with a cold forceps for histology.      The examined duodenum was normal.      Biopsies were taken with a cold  forceps in the duodenal bulb and in the       first part of the duodenum for histology. Impression:        - LA Grade A reflux esophagitis. Biopsied.                    - Gastric  mucosal atrophy. Biopsied.                    - Normal examined duodenum.                    - Biopsies were taken with a cold forceps for histology in                     the duodenal bulb and in the first part of the duodenum. Recommendation:    - Continue present medications.                    - Await pathology results.                    - The findings and recommendations were discussed with the                     patient.                    - The findings and recommendations were discussed with the                     patient's family.                    - Perform a colonoscopy today. Procedure Code(s): --- Professional ---                    [phone], Esophagogastroduodenoscopy, flexible, transoral;                     with biopsy, single or multiple Diagnosis Code(s): --- Professional ---                    K21.0, Gastro-esophageal reflux disease with esophagitis                    K31.89, Other diseases of stomach and duodenum                    D50.9, Iron deficiency anemia, unspecified                    R19.5, Other fecal abnormalities CPT copyright 2014 American Medical Association. All rights reserved. The codes documented in this report are preliminary and upon coder review may  be revised to meet current compliance requirements. Mellody Life, MD 11/21/2015 8:57:41 AM This report has been signed electronically. Number of Addenda: 0 Note Initiated On: 11/21/2015 8:38 AM      [facility]

## 2015-11-21 NOTE — Transfer of Care (Signed)
Immediate Anesthesia Transfer of Care Note  Patient: Pamela Smith  Procedure(s) Performed: Procedure(s): COLONOSCOPY WITH PROPOFOL (N/A) ESOPHAGOGASTRODUODENOSCOPY (EGD) WITH PROPOFOL (N/A)  Patient Location: PACU  Anesthesia Type:General  Level of Consciousness: awake, alert  and oriented  Airway & Oxygen Therapy: Patient Spontanous Breathing and Patient connected to nasal cannula oxygen  Post-op Assessment: Report given to RN and Post -op Vital signs reviewed and stable  Post vital signs: Reviewed and stable  Last Vitals:  Filed Vitals:   11/21/15 0816  BP: 148/80  Pulse: 76  Temp: 35.7 C  Resp: 18    Complications: No apparent anesthesia complications

## 2015-11-21 NOTE — Op Note (Signed)
[facility] Gastroenterology Patient Name: Pamela Smith Procedure Date: 11/21/2015 8:38 AM MRN: [id number] Account #: [id number] Date of Birth: 04-04-1946 Admit Type: Outpatient Age: 70 Room: [facility] ENDO ROOM 2 Gender: Female Note Status: Finalized Procedure:         Colonoscopy Indications:       Heme positive stool, Iron deficiency anemia Patient Profile:   This is a 70 year old female. Providers:         Gerrit Heck. Rayann Heman, MD Referring MD:      Glendon Axe (Referring MD) Medicines:         Propofol per Anesthesia Complications:     No immediate complications. Procedure:         Pre-Anesthesia Assessment:                    - Prior to the procedure, a History and Physical was                     performed, and patient medications, allergies and                     sensitivities were reviewed. The patient's tolerance of                     previous anesthesia was reviewed.                    After obtaining informed consent, the colonoscope was                     passed under direct vision. Throughout the procedure, the                     patient's blood pressure, pulse, and oxygen saturations                     were monitored continuously. The Colonoscope was                     introduced through the anus and advanced to the the cecum,                     identified by appendiceal orifice and ileocecal valve. The                     colonoscopy was performed without difficulty. The patient                     tolerated the procedure well. The quality of the bowel                     preparation was excellent. Findings:      The perianal and digital rectal examinations were normal.      There was a large protruding lesion, likely lipoma, 30 mm in diameter,       at the ileocecal valve. Biopsies were taken with a cold forceps for       histology.      Internal hemorrhoids were found during retroflexion. The hemorrhoids       were Grade I (internal hemorrhoids  that do not prolapse).      The exam was otherwise without abnormality. 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                    -  Internal hemorrhoids.                    - The examination was otherwise normal. Recommendation:    - Observe patient in GI recovery unit.                    - Resume regular diet.                    - Perform CT scan (computed tomography) of the abdomen                     with contrast at appointment to be scheduled to f/o the                     very large protruding lesion, likelly lipoma.                    - Start iron 1 tab twice a day.                    - Follow Hgb and iron studies, obtain capusle endoscopy of                     Hgb and iron studies do not improve.                    - Continue present medications.                    - Repeat colonoscopy in 10 years for screening purposes.                    - Return to referring physician.                    - The findings and recommendations were discussed with the                     patient.                    - The findings and recommendations were discussed with the                     patient's family. Procedure Code(s): --- Professional ---                    [phone], Colonoscopy, flexible; with biopsy, single or                     multiple Diagnosis Code(s): --- Professional ---                    D17.5, Benign lipomatous neoplasm of intra-abdominal organs                    K64.0, First degree hemorrhoids                    R19.5, Other fecal abnormalities                    D50.9, Iron deficiency anemia, unspecified CPT copyright 2014 American Medical Association. All rights reserved. The codes documented in this report are preliminary and upon coder review may  be revised to meet current compliance requirements. Mellody Life, MD 11/21/2015 9:22:36 AM This report has been signed electronically. Number of  Addenda: 0 Note Initiated On: 11/21/2015 8:38  AM Scope Withdrawal Time: 0 hours 11 minutes 39 seconds  Total Procedure Duration: 0 hours 18 minutes 18 seconds       [facility]

## 2015-11-21 NOTE — Discharge Instructions (Signed)

## 2015-11-21 NOTE — Anesthesia Preprocedure Evaluation (Signed)
Anesthesia Evaluation  Patient identified by MRN, date of birth, ID band Patient awake    Reviewed: Allergy & Precautions, NPO status , Patient's Chart, lab work & pertinent test results  Airway Mallampati: II  TM Distance: >3 FB Neck ROM: Limited    Dental  (+) Teeth Intact   Pulmonary asthma , former smoker,    Pulmonary exam normal        Cardiovascular Exercise Tolerance: Good hypertension, Pt. on medications and Pt. on home beta blockers  Rhythm:Regular Rate:Normal     Neuro/Psych Anxiety Depression    GI/Hepatic   Endo/Other  diabetes, Type 2Hypothyroidism BG 150.  Renal/GU      Musculoskeletal   Abdominal (+) + obese,   Peds  Hematology   Anesthesia Other Findings   Reproductive/Obstetrics                             Anesthesia Physical Anesthesia Plan  ASA: III  Anesthesia Plan: General   Post-op Pain Management:    Induction: Intravenous  Airway Management Planned: Nasal Cannula  Additional Equipment:   Intra-op Plan:   Post-operative Plan:   Informed Consent: I have reviewed the patients History and Physical, chart, labs and discussed the procedure including the risks, benefits and alternatives for the proposed anesthesia with the patient or authorized representative who has indicated his/her understanding and acceptance.   Dental advisory given  Plan Discussed with: CRNA  Anesthesia Plan Comments:         Anesthesia Quick Evaluation

## 2015-11-21 NOTE — Anesthesia Procedure Notes (Signed)
Performed by: Upton Russey Pre-anesthesia Checklist: Patient identified, Emergency Drugs available, Suction available, Patient being monitored and Timeout performed Patient Re-evaluated:Patient Re-evaluated prior to inductionOxygen Delivery Method: Nasal cannula Intubation Type: IV induction       

## 2015-11-21 NOTE — H&P (Signed)
Primary Care Physician:  Glendon Axe, MD  Pre-Procedure History & Physical: HPI:  Pamela Smith is a 70 y.o. female is here for an endoscopy / colonoscopy   Past Medical History  Diagnosis Date  . Anemia   . Anxiety   . Asthma without status asthmaticus   . Depression   . Diabetes mellitus without complication ([place])   . Dyslipidemia   . Hyperlipidemia   . Hypertension   . Hypothyroidism     Past Surgical History  Procedure Laterality Date  . Tonsillectomy    . Appendectomy    . Broken leg      Prior to Admission medications   Medication Sig Start Date End Date Taking? Authorizing Provider  atorvastatin (LIPITOR) 10 MG tablet Take 10 mg by mouth daily.   Yes Historical Provider, MD  buPROPion (WELLBUTRIN SR) 150 MG 12 hr tablet Take 150 mg by mouth 2 (two) times daily.   Yes Historical Provider, MD  calcium carbonate (TUMS - DOSED IN MG ELEMENTAL CALCIUM) 500 MG chewable tablet Chew 1 tablet by mouth daily.   Yes Historical Provider, MD  felodipine (PLENDIL) 5 MG 24 hr tablet Take 5 mg by mouth daily.   Yes Historical Provider, MD  Fish Oil-Cholecalciferol (FISH OIL + D3 PO) Take by mouth.   Yes Historical Provider, MD  insulin glargine (LANTUS) 100 UNIT/ML injection Inject 100 Units into the skin as directed.   Yes Historical Provider, MD  levothyroxine (SYNTHROID, LEVOTHROID) 75 MCG tablet Take 75 mcg by mouth daily before breakfast.   Yes Historical Provider, MD  metFORMIN (GLUCOPHAGE) 1000 MG tablet Take 1,000 mg by mouth 2 (two) times daily with a meal.   Yes Historical Provider, MD  metoprolol tartrate (LOPRESSOR) 25 MG tablet Take 25 mg by mouth 2 (two) times daily.   Yes Historical Provider, MD  sitaGLIPtin (JANUVIA) 100 MG tablet Take 100 mg by mouth daily.   Yes Historical Provider, MD    Allergies as of 11/08/2015  . (Not on File)    History reviewed. No pertinent family history.  Social History   Social History  . Marital Status: Single    Spouse  Name: N/A  . Number of Children: N/A  . Years of Education: N/A   Occupational History  . Not on file.   Social History Main Topics  . Smoking status: Former Research scientist (life sciences)  . Smokeless tobacco: Not on file  . Alcohol Use: Not on file  . Drug Use: Not on file  . Sexual Activity: Not on file   Other Topics Concern  . Not on file   Social History Narrative     Physical Exam: BP 148/80 mmHg  Pulse 76  Temp(Src) 96.2 F (35.7 C) (Tympanic)  Resp 18  Ht 5\' 4"  (1.626 m)  Wt 78.926 kg (174 lb)  BMI 29.85 kg/m2  SpO2 99% General:   Alert,  pleasant and cooperative in NAD Head:  Normocephalic and atraumatic. Neck:  Supple; no masses or thyromegaly. Lungs:  Clear throughout to auscultation.    Heart:  Regular rate and rhythm. Abdomen:  Soft, nontender and nondistended. Normal bowel sounds, without guarding, and without rebound.   Neurologic:  Alert and  oriented x4;  grossly normal neurologically.  Impression/Plan: Pamela Smith is here for an endoscopy to be performed for IDA, colon for IDA and + fobt  Risks, benefits, limitations, and alternatives regarding  Endoscopy/colonoscopy have been reviewed with the patient.  Questions have been answered.  All parties agreeable.  Josefine Class, MD  11/21/2015, 8:42 AM

## 2015-11-22 ENCOUNTER — Encounter: Payer: Self-pay | Admitting: Gastroenterology

## 2015-11-22 ENCOUNTER — Other Ambulatory Visit: Payer: Self-pay | Admitting: Gastroenterology

## 2015-11-22 DIAGNOSIS — K6389 Other specified diseases of intestine: Secondary | ICD-10-CM

## 2015-11-22 DIAGNOSIS — R933 Abnormal findings on diagnostic imaging of other parts of digestive tract: Secondary | ICD-10-CM

## 2015-11-22 LAB — SURGICAL PATHOLOGY

## 2015-11-25 ENCOUNTER — Ambulatory Visit: Payer: Medicare Other

## 2015-11-29 ENCOUNTER — Ambulatory Visit
Admission: RE | Admit: 2015-11-29 | Discharge: 2015-11-29 | Disposition: A | Discharge status: Home / Self Care | Payer: Medicare Other | Source: Ambulatory Visit | Attending: Gastroenterology | Admitting: Gastroenterology

## 2015-11-29 ENCOUNTER — Other Ambulatory Visit: Payer: Self-pay | Admitting: Internal Medicine

## 2015-11-29 DIAGNOSIS — Z1231 Encounter for screening mammogram for malignant neoplasm of breast: Secondary | ICD-10-CM

## 2015-11-29 DIAGNOSIS — K639 Disease of intestine, unspecified: Secondary | ICD-10-CM | POA: Diagnosis present

## 2015-11-29 DIAGNOSIS — I700 Atherosclerosis of aorta: Secondary | ICD-10-CM | POA: Insufficient documentation

## 2015-11-29 DIAGNOSIS — R933 Abnormal findings on diagnostic imaging of other parts of digestive tract: Secondary | ICD-10-CM

## 2015-11-29 DIAGNOSIS — D1779 Benign lipomatous neoplasm of other sites: Secondary | ICD-10-CM | POA: Diagnosis not present

## 2015-11-29 DIAGNOSIS — K6389 Other specified diseases of intestine: Secondary | ICD-10-CM

## 2015-11-29 MED ORDER — IOHEXOL 300 MG/ML  SOLN
80.0000 mL | Freq: Once | INTRAMUSCULAR | Status: AC | PRN
  Administered 2015-11-29: 80 mL via INTRAVENOUS

## 2015-12-29 ENCOUNTER — Ambulatory Visit: Payer: Medicare Other

## 2015-12-30 ENCOUNTER — Ambulatory Visit: Admission: RE | Admit: 2015-12-30 | Payer: Medicare Other | Source: Ambulatory Visit

## 2016-01-03 ENCOUNTER — Ambulatory Visit
Admission: RE | Admit: 2016-01-03 | Discharge: 2016-01-03 | Disposition: A | Discharge status: Home / Self Care | Payer: Medicare Other | Source: Ambulatory Visit | Attending: Internal Medicine | Admitting: Internal Medicine

## 2016-01-03 ENCOUNTER — Other Ambulatory Visit: Payer: Self-pay | Admitting: Internal Medicine

## 2016-01-03 DIAGNOSIS — Z1231 Encounter for screening mammogram for malignant neoplasm of breast: Secondary | ICD-10-CM | POA: Diagnosis not present

## 2016-11-30 ENCOUNTER — Other Ambulatory Visit: Payer: Self-pay | Admitting: Internal Medicine

## 2016-11-30 DIAGNOSIS — Z1231 Encounter for screening mammogram for malignant neoplasm of breast: Secondary | ICD-10-CM

## 2017-01-07 ENCOUNTER — Ambulatory Visit: Payer: Medicare Other

## 2017-03-13 ENCOUNTER — Ambulatory Visit
Admission: RE | Admit: 2017-03-13 | Discharge: 2017-03-13 | Disposition: A | Discharge status: Home / Self Care | Payer: Medicare Other | Source: Ambulatory Visit | Attending: Internal Medicine | Admitting: Internal Medicine

## 2017-03-13 DIAGNOSIS — Z1231 Encounter for screening mammogram for malignant neoplasm of breast: Secondary | ICD-10-CM | POA: Insufficient documentation

## 2017-10-04 DIAGNOSIS — R7401 Elevation of levels of liver transaminase levels: Secondary | ICD-10-CM | POA: Insufficient documentation

## 2018-01-31 ENCOUNTER — Other Ambulatory Visit: Payer: Self-pay | Admitting: Internal Medicine

## 2018-01-31 DIAGNOSIS — Z1231 Encounter for screening mammogram for malignant neoplasm of breast: Secondary | ICD-10-CM

## 2018-03-14 ENCOUNTER — Ambulatory Visit
Admission: RE | Admit: 2018-03-14 | Discharge: 2018-03-14 | Disposition: A | Discharge status: Home / Self Care | Payer: Medicare Other | Source: Ambulatory Visit | Attending: Internal Medicine | Admitting: Internal Medicine

## 2018-03-14 DIAGNOSIS — Z1231 Encounter for screening mammogram for malignant neoplasm of breast: Secondary | ICD-10-CM | POA: Diagnosis not present

## 2018-04-04 DIAGNOSIS — D500 Iron deficiency anemia secondary to blood loss (chronic): Secondary | ICD-10-CM | POA: Insufficient documentation

## 2018-07-12 DIAGNOSIS — M8589 Other specified disorders of bone density and structure, multiple sites: Secondary | ICD-10-CM | POA: Insufficient documentation

## 2019-01-27 ENCOUNTER — Other Ambulatory Visit: Payer: Self-pay | Admitting: Internal Medicine

## 2019-01-27 DIAGNOSIS — Z1231 Encounter for screening mammogram for malignant neoplasm of breast: Secondary | ICD-10-CM

## 2019-05-13 ENCOUNTER — Ambulatory Visit
Admission: RE | Admit: 2019-05-13 | Discharge: 2019-05-13 | Disposition: A | Discharge status: Home / Self Care | Payer: Medicare Other | Source: Ambulatory Visit | Attending: Internal Medicine | Admitting: Internal Medicine

## 2019-05-13 DIAGNOSIS — Z1231 Encounter for screening mammogram for malignant neoplasm of breast: Secondary | ICD-10-CM | POA: Insufficient documentation

## 2019-05-14 ENCOUNTER — Other Ambulatory Visit
Admission: RE | Admit: 2019-05-14 | Discharge: 2019-05-14 | Disposition: A | Discharge status: Home / Self Care | Payer: Medicare Other | Source: Ambulatory Visit | Attending: Internal Medicine | Admitting: Internal Medicine

## 2019-05-18 ENCOUNTER — Ambulatory Visit
Admission: RE | Admit: 2019-05-18 | Payer: Medicare Other | Source: Home / Self Care | Admitting: Unknown Physician Specialty

## 2019-05-18 ENCOUNTER — Encounter: Admission: RE | Payer: Self-pay | Source: Home / Self Care

## 2019-05-18 SURGERY — ESOPHAGOGASTRODUODENOSCOPY (EGD) WITH PROPOFOL
Anesthesia: General

## 2019-08-07 DIAGNOSIS — Z794 Long term (current) use of insulin: Secondary | ICD-10-CM | POA: Insufficient documentation

## 2019-08-07 DIAGNOSIS — E1165 Type 2 diabetes mellitus with hyperglycemia: Secondary | ICD-10-CM | POA: Insufficient documentation

## 2019-08-14 ENCOUNTER — Other Ambulatory Visit: Payer: Self-pay

## 2019-08-14 ENCOUNTER — Other Ambulatory Visit
Admission: RE | Admit: 2019-08-14 | Discharge: 2019-08-14 | Disposition: A | Discharge status: Home / Self Care | Payer: Medicare Other | Source: Ambulatory Visit | Attending: Internal Medicine | Admitting: Internal Medicine

## 2019-08-14 DIAGNOSIS — Z01812 Encounter for preprocedural laboratory examination: Secondary | ICD-10-CM | POA: Insufficient documentation

## 2019-08-14 DIAGNOSIS — Z20828 Contact with and (suspected) exposure to other viral communicable diseases: Secondary | ICD-10-CM | POA: Insufficient documentation

## 2019-08-14 LAB — SARS CORONAVIRUS 2 (TAT 6-24 HRS): SARS Coronavirus 2: NEGATIVE

## 2019-08-19 ENCOUNTER — Ambulatory Visit
Admission: RE | Admit: 2019-08-19 | Discharge: 2019-08-19 | Disposition: A | Discharge status: Home / Self Care | Payer: Medicare Other | Attending: Internal Medicine | Admitting: Internal Medicine

## 2019-08-19 ENCOUNTER — Other Ambulatory Visit: Payer: Self-pay

## 2019-08-19 ENCOUNTER — Ambulatory Visit: Payer: Medicare Other | Admitting: Anesthesiology

## 2019-08-19 ENCOUNTER — Encounter
Admission: RE | Disposition: A | Discharge status: Home / Self Care | Payer: Self-pay | Source: Home / Self Care | Attending: Internal Medicine

## 2019-08-19 ENCOUNTER — Encounter: Payer: Self-pay | Admitting: *Deleted

## 2019-08-19 DIAGNOSIS — F329 Major depressive disorder, single episode, unspecified: Secondary | ICD-10-CM | POA: Insufficient documentation

## 2019-08-19 DIAGNOSIS — Z79890 Hormone replacement therapy (postmenopausal): Secondary | ICD-10-CM | POA: Insufficient documentation

## 2019-08-19 DIAGNOSIS — Z7982 Long term (current) use of aspirin: Secondary | ICD-10-CM | POA: Insufficient documentation

## 2019-08-19 DIAGNOSIS — I10 Essential (primary) hypertension: Secondary | ICD-10-CM | POA: Insufficient documentation

## 2019-08-19 DIAGNOSIS — J45909 Unspecified asthma, uncomplicated: Secondary | ICD-10-CM | POA: Diagnosis not present

## 2019-08-19 DIAGNOSIS — E785 Hyperlipidemia, unspecified: Secondary | ICD-10-CM | POA: Diagnosis not present

## 2019-08-19 DIAGNOSIS — E119 Type 2 diabetes mellitus without complications: Secondary | ICD-10-CM | POA: Diagnosis not present

## 2019-08-19 DIAGNOSIS — D509 Iron deficiency anemia, unspecified: Secondary | ICD-10-CM | POA: Diagnosis present

## 2019-08-19 DIAGNOSIS — E669 Obesity, unspecified: Secondary | ICD-10-CM | POA: Diagnosis not present

## 2019-08-19 DIAGNOSIS — F419 Anxiety disorder, unspecified: Secondary | ICD-10-CM | POA: Insufficient documentation

## 2019-08-19 DIAGNOSIS — Z794 Long term (current) use of insulin: Secondary | ICD-10-CM | POA: Insufficient documentation

## 2019-08-19 DIAGNOSIS — Z79899 Other long term (current) drug therapy: Secondary | ICD-10-CM | POA: Diagnosis not present

## 2019-08-19 DIAGNOSIS — Z87891 Personal history of nicotine dependence: Secondary | ICD-10-CM | POA: Diagnosis not present

## 2019-08-19 DIAGNOSIS — E039 Hypothyroidism, unspecified: Secondary | ICD-10-CM | POA: Insufficient documentation

## 2019-08-19 DIAGNOSIS — K6389 Other specified diseases of intestine: Secondary | ICD-10-CM | POA: Insufficient documentation

## 2019-08-19 DIAGNOSIS — K640 First degree hemorrhoids: Secondary | ICD-10-CM | POA: Diagnosis not present

## 2019-08-19 HISTORY — PX: COLONOSCOPY WITH PROPOFOL: SHX5780

## 2019-08-19 HISTORY — PX: ESOPHAGOGASTRODUODENOSCOPY (EGD) WITH PROPOFOL: SHX5813

## 2019-08-19 LAB — GLUCOSE, CAPILLARY: Glucose-Capillary: 184 mg/dL — ABNORMAL HIGH (ref 70–99)

## 2019-08-19 SURGERY — COLONOSCOPY WITH PROPOFOL
Anesthesia: General

## 2019-08-19 MED ORDER — MIDAZOLAM HCL 2 MG/2ML IJ SOLN
INTRAMUSCULAR | Status: DC | PRN
  Administered 2019-08-19 (×2): 0.5 mg via INTRAVENOUS
  Administered 2019-08-19: 1 mg via INTRAVENOUS

## 2019-08-19 MED ORDER — PROPOFOL 10 MG/ML IV BOLUS
INTRAVENOUS | Status: DC | PRN
  Administered 2019-08-19: 10 mg via INTRAVENOUS
  Administered 2019-08-19: 50 mg via INTRAVENOUS

## 2019-08-19 MED ORDER — FENTANYL CITRATE (PF) 100 MCG/2ML IJ SOLN
INTRAMUSCULAR | Status: AC
  Filled 2019-08-19: qty 2

## 2019-08-19 MED ORDER — PROPOFOL 500 MG/50ML IV EMUL
INTRAVENOUS | Status: AC
  Filled 2019-08-19: qty 50

## 2019-08-19 MED ORDER — FENTANYL CITRATE (PF) 100 MCG/2ML IJ SOLN
INTRAMUSCULAR | Status: DC | PRN
  Administered 2019-08-19 (×2): 25 ug via INTRAVENOUS
  Administered 2019-08-19: 50 ug via INTRAVENOUS

## 2019-08-19 MED ORDER — SODIUM CHLORIDE 0.9 % IV SOLN
INTRAVENOUS | Status: DC
  Administered 2019-08-19: 09:00:00 via INTRAVENOUS

## 2019-08-19 MED ORDER — PROPOFOL 500 MG/50ML IV EMUL
INTRAVENOUS | Status: DC | PRN
  Administered 2019-08-19: 75 ug/kg/min via INTRAVENOUS

## 2019-08-19 MED ORDER — MIDAZOLAM HCL 2 MG/2ML IJ SOLN
INTRAMUSCULAR | Status: AC
  Filled 2019-08-19: qty 2

## 2019-08-19 NOTE — Interval H&P Note (Deleted)
History and Physical Interval Note:  08/19/2019 8:34 AM  Pamela Smith  has presented today for surgery, with the diagnosis of IDA.  The various methods of treatment have been discussed with the patient and family. After consideration of risks, benefits and other options for treatment, the patient has consented to  Procedure(s): COLONOSCOPY WITH PROPOFOL (N/A) ESOPHAGOGASTRODUODENOSCOPY (EGD) WITH PROPOFOL (N/A) as a surgical intervention.  The patient's history has been reviewed, patient examined, no change in status, stable for surgery.  I have reviewed the patient's chart and labs.  Questions were answered to the patient's satisfaction.     [place], [place]

## 2019-08-19 NOTE — H&P (Signed)
Outpatient short stay form Pre-procedure 08/19/2019 8:31 AM Pamela Smith, M.D.  Primary Physician: Tracie Harrier  Reason for visit:  Gastric intestinal metaplasia surveillance, GERD  History of present illness:  Patient is a pleasant 73 y/o female with hx of GIM. Presents as well for hx of iron deficiency anemia. Last colonoscopy in 2017 showed only large lipoma at IC valve. GERD well controlled on Omeprazole.    Current Facility-Administered Medications:  .  0.9 %  sodium chloride infusion, , Intravenous, Continuous, [place], Benay Pike, MD, Last Rate: 20 mL/hr at 08/19/19 0830  Medications Prior to Admission  Medication Sig Dispense Refill Last Dose  . aspirin 81 MG chewable tablet Chew by mouth daily.   08/18/2019  . felodipine (PLENDIL) 5 MG 24 hr tablet Take 5 mg by mouth daily.   08/18/2019 at Unknown time  . insulin glargine (LANTUS) 100 UNIT/ML injection Inject 100 Units into the skin as directed.   08/18/2019 at Unknown time  . levothyroxine (SYNTHROID, LEVOTHROID) 75 MCG tablet Take 75 mcg by mouth daily before breakfast.   08/18/2019 at Unknown time  . metFORMIN (GLUCOPHAGE) 1000 MG tablet Take 1,000 mg by mouth 2 (two) times daily with a meal.   08/18/2019 at Unknown time  . metoprolol tartrate (LOPRESSOR) 25 MG tablet Take 25 mg by mouth 2 (two) times daily.   08/18/2019 at Unknown time  . omeprazole (PRILOSEC) 40 MG capsule Take 40 mg by mouth daily.   08/18/2019 at Unknown time  . sitaGLIPtin (JANUVIA) 100 MG tablet Take 100 mg by mouth daily.   08/18/2019 at Unknown time  . atorvastatin (LIPITOR) 10 MG tablet Take 10 mg by mouth daily.     Marland Kitchen buPROPion (WELLBUTRIN SR) 150 MG 12 hr tablet Take 150 mg by mouth 2 (two) times daily.     . calcium carbonate (TUMS - DOSED IN MG ELEMENTAL CALCIUM) 500 MG chewable tablet Chew 1 tablet by mouth daily.     . Fish Oil-Cholecalciferol (FISH OIL + D3 PO) Take by mouth.        Allergies  Allergen Reactions  . Amlodipine   .  Carvedilol   . Felodipine Swelling    Greater than 5 mg  . Lisinopril      Past Medical History:  Diagnosis Date  . Anemia   . Anxiety   . Asthma without status asthmaticus   . Depression   . Diabetes mellitus without complication ([place])   . Dyslipidemia   . Hyperlipidemia   . Hypertension   . Hypothyroidism     Review of systems:  Otherwise negative.    Physical Exam  Gen: Alert, oriented. Appears stated age.  HEENT: [place]/AT. PERRLA. Lungs: CTA, no wheezes. CV: RR nl S1, S2. Abd: soft, benign, no masses. BS+ Ext: No edema. Pulses 2+    Planned procedures: Proceed with EGD. The patient understands the nature of the planned procedure, indications, risks, alternatives and potential complications including but not limited to bleeding, infection, perforation, damage to internal organs and possible oversedation/side effects from anesthesia. The patient agrees and gives consent to proceed.  Please refer to procedure notes for findings, recommendations and patient disposition/instructions.     Pamela Smith, M.D. Gastroenterology 08/19/2019  8:31 AM

## 2019-08-19 NOTE — Op Note (Signed)
[facility] Gastroenterology Patient Name: Pamela Smith Procedure Date: 08/19/2019 8:39 AM MRN: [id number] Account #: [id number] Date of Birth: 11-Jun-1946 Admit Type: Outpatient Age: 73 Room: [facility] ENDO ROOM 3 Gender: Female Note Status: Finalized Procedure:             Upper GI endoscopy Indications:           Iron deficiency anemia, Follow-up of intestinal                         metaplasia Providers:             Benay Pike. [place] MD, MD Medicines:             Propofol per Anesthesia Complications:         No immediate complications. Procedure:             Pre-Anesthesia Assessment:                        - The risks and benefits of the procedure and the                         sedation options and risks were discussed with the                         patient. All questions were answered and informed                         consent was obtained.                        - Patient identification and proposed procedure were                         verified prior to the procedure by the nurse. The                         procedure was verified in the procedure room.                        - ASA Grade Assessment: III - A patient with severe                         systemic disease.                        - After reviewing the risks and benefits, the patient                         was deemed in satisfactory condition to undergo the                         procedure.                        After obtaining informed consent, the endoscope was                         passed under direct vision. Throughout the procedure,  the patient's blood pressure, pulse, and oxygen                         saturations were monitored continuously. The Endoscope                         was introduced through the mouth, and advanced to the                         third part of duodenum. The upper GI endoscopy was                         accomplished without  difficulty. The patient tolerated                         the procedure well. Findings:      The esophagus was normal.      Patchy mildly erythematous mucosa without bleeding was found in the       gastric antrum. Biopsy mapping performed with biopsies obtained from the       prepyloric stomach (lesser and greater curvature), incisural angularis       and body (lesser and greater curvature). 5 biopsies total submitted in       one jar.      The examined duodenum was normal. Impression:            - Normal esophagus.                        - Erythematous mucosa in the antrum.                        - Normal examined duodenum.                        - No specimens collected. Recommendation:        - Await pathology results.                        - Proceed with colonoscopy Procedure Code(s):     --- Professional ---                        [phone], Esophagogastroduodenoscopy, flexible,                         transoral; diagnostic, including collection of                         specimen(s) by brushing or washing, when performed                         (separate procedure) Diagnosis Code(s):     --- Professional ---                        D50.9, Iron deficiency anemia, unspecified                        K31.89, Other diseases of stomach and duodenum CPT copyright 2019 American Medical Association. All rights reserved. The codes documented in this report are preliminary and upon coder review may  be  revised to meet current compliance requirements. Efrain Sella MD, MD 08/19/2019 9:01:14 AM This report has been signed electronically. Number of Addenda: 0 Note Initiated On: 08/19/2019 8:39 AM Estimated Blood Loss:  Estimated blood loss: none.      [facility]

## 2019-08-19 NOTE — H&P (Signed)
Outpatient short stay form Pre-procedure 08/19/2019 9:26 AM Pamela Smith K. Alice Reichert, M.D.  Primary Physician: Tracie Harrier, M.D.  Reason for visit: Iron deficiency anemia, hx of gastric intestinal metaplasia  History of present illness:  As above. Patient denies change in bowel habits, rectal bleeding, weight loss or abdominal pain.     Current Facility-Administered Medications:  .  0.9 %  sodium chloride infusion, , Intravenous, Continuous, Benedict, Benay Pike, MD, Last Rate: 20 mL/hr at 08/19/19 0830  Facility-Administered Medications Ordered in Other Encounters:  .  fentaNYL (SUBLIMAZE) injection, , , Anesthesia Intra-op, Lorie Apley, CRNA, 25 mcg at 08/19/19 0908 .  midazolam (VERSED) injection, , , Anesthesia Intra-op, Lorie Apley, CRNA, 0.5 mg at 08/19/19 0908 .  propofol (DIPRIVAN) 10 mg/mL bolus/IV push, , , Anesthesia Intra-op, Lorie Apley, CRNA, 10 mg at 08/19/19 0909 .  propofol (DIPRIVAN) 500 MG/50ML infusion, , , Continuous PRN, Lorie Apley, CRNA, Stopped at 08/19/19 [id number]  Medications Prior to Admission  Medication Sig Dispense Refill Last Dose  . aspirin 81 MG chewable tablet Chew by mouth daily.   08/18/2019  . felodipine (PLENDIL) 5 MG 24 hr tablet Take 5 mg by mouth daily.   08/18/2019 at Unknown time  . insulin glargine (LANTUS) 100 UNIT/ML injection Inject 100 Units into the skin as directed.   08/18/2019 at Unknown time  . levothyroxine (SYNTHROID, LEVOTHROID) 75 MCG tablet Take 75 mcg by mouth daily before breakfast.   08/18/2019 at Unknown time  . metFORMIN (GLUCOPHAGE) 1000 MG tablet Take 1,000 mg by mouth 2 (two) times daily with a meal.   08/18/2019 at Unknown time  . metoprolol tartrate (LOPRESSOR) 25 MG tablet Take 25 mg by mouth 2 (two) times daily.   08/18/2019 at Unknown time  . omeprazole (PRILOSEC) 40 MG capsule Take 40 mg by mouth daily.   08/18/2019 at Unknown time  . sitaGLIPtin (JANUVIA) 100 MG tablet Take 100 mg by mouth daily.   08/18/2019 at Unknown  time  . atorvastatin (LIPITOR) 10 MG tablet Take 10 mg by mouth daily.     Marland Kitchen buPROPion (WELLBUTRIN SR) 150 MG 12 hr tablet Take 150 mg by mouth 2 (two) times daily.     . calcium carbonate (TUMS - DOSED IN MG ELEMENTAL CALCIUM) 500 MG chewable tablet Chew 1 tablet by mouth daily.     . Fish Oil-Cholecalciferol (FISH OIL + D3 PO) Take by mouth.        Allergies  Allergen Reactions  . Amlodipine   . Carvedilol   . Felodipine Swelling    Greater than 5 mg  . Lisinopril      Past Medical History:  Diagnosis Date  . Anemia   . Anxiety   . Asthma without status asthmaticus   . Depression   . Diabetes mellitus without complication ([place])   . Dyslipidemia   . Hyperlipidemia   . Hypertension   . Hypothyroidism     Review of systems:  Otherwise negative.    Physical Exam  Gen: Alert, oriented. Appears stated age.  HEENT: [place]/AT. PERRLA. Lungs: CTA, no wheezes. CV: RR nl S1, S2. Abd: soft, benign, no masses. BS+ Ext: No edema. Pulses 2+    Planned procedures: Proceed with EGD and colonoscopy. The patient understands the nature of the planned procedure, indications, risks, alternatives and potential complications including but not limited to bleeding, infection, perforation, damage to internal organs and possible oversedation/side effects from anesthesia. The patient agrees and gives consent to proceed.  Please refer to  procedure notes for findings, recommendations and patient disposition/instructions.     Pamela Smith K. Alice Reichert, M.D. Gastroenterology 08/19/2019  9:26 AM

## 2019-08-19 NOTE — Transfer of Care (Signed)
Immediate Anesthesia Transfer of Care Note  Patient: Pamela Smith  Procedure(s) Performed: COLONOSCOPY WITH PROPOFOL (N/A ) ESOPHAGOGASTRODUODENOSCOPY (EGD) WITH PROPOFOL (N/A )  Patient Location: PACU  Anesthesia Type:General  Level of Consciousness: sedated  Airway & Oxygen Therapy: Patient Spontanous Breathing  Post-op Assessment: Report given to RN, Post -op Vital signs reviewed and stable and Patient moving all extremities  Post vital signs: Reviewed and stable  Last Vitals:  Vitals Value Taken Time  BP    Temp    Pulse 68 08/19/19 0925  Resp 12 08/19/19 0925  SpO2 98 % 08/19/19 0925  Vitals shown include unvalidated device data.  Last Pain:  Vitals:   08/19/19 0924  TempSrc: (P) Tympanic         Complications: No apparent anesthesia complications

## 2019-08-19 NOTE — Anesthesia Post-op Follow-up Note (Signed)
Anesthesia QCDR form completed.        

## 2019-08-19 NOTE — Anesthesia Preprocedure Evaluation (Signed)
Anesthesia Evaluation  Patient identified by MRN, date of birth, ID band Patient awake    Reviewed: Allergy & Precautions, NPO status , Patient's Chart, lab work & pertinent test results  Airway Mallampati: II  TM Distance: >3 FB Neck ROM: Limited    Dental  (+) Teeth Intact   Pulmonary asthma , former smoker,    Pulmonary exam normal        Cardiovascular Exercise Tolerance: Good hypertension, Pt. on medications and Pt. on home beta blockers  Rhythm:Regular Rate:Normal     Neuro/Psych PSYCHIATRIC DISORDERS Anxiety Depression    GI/Hepatic   Endo/Other  diabetes, Type 2Hypothyroidism BG 150.  Renal/GU      Musculoskeletal   Abdominal (+) + obese,   Peds negative pediatric ROS (+)  Hematology  (+) anemia ,   Anesthesia Other Findings Past Medical History: No date: Anemia No date: Anxiety No date: Asthma without status asthmaticus No date: Depression No date: Diabetes mellitus without complication (HCC) No date: Dyslipidemia No date: Hyperlipidemia No date: Hypertension No date: Hypothyroidism  Reproductive/Obstetrics                             Anesthesia Physical  Anesthesia Plan  ASA: III  Anesthesia Plan: General   Post-op Pain Management:    Induction: Intravenous  PONV Risk Score and Plan:   Airway Management Planned: Nasal Cannula  Additional Equipment:   Intra-op Plan:   Post-operative Plan:   Informed Consent: I have reviewed the patients History and Physical, chart, labs and discussed the procedure including the risks, benefits and alternatives for the proposed anesthesia with the patient or authorized representative who has indicated his/her understanding and acceptance.     Dental advisory given  Plan Discussed with: CRNA  Anesthesia Plan Comments:         Anesthesia Quick Evaluation

## 2019-08-19 NOTE — Anesthesia Postprocedure Evaluation (Signed)
Anesthesia Post Note  Patient: Pamela Smith  Procedure(s) Performed: COLONOSCOPY WITH PROPOFOL (N/A ) ESOPHAGOGASTRODUODENOSCOPY (EGD) WITH PROPOFOL (N/A )  Patient location during evaluation: PACU Anesthesia Type: General Level of consciousness: awake and alert and oriented Pain management: pain level controlled Vital Signs Assessment: post-procedure vital signs reviewed and stable Respiratory status: spontaneous breathing Cardiovascular status: blood pressure returned to baseline Anesthetic complications: no     Last Vitals:  Vitals:   08/19/19 0944 08/19/19 0954  BP: (!) 125/56 (!) 111/49  Pulse: 76 75  Resp: 15 15  Temp:    SpO2: 100% 100%    Last Pain:  Vitals:   08/19/19 0954  TempSrc:   PainSc: 0-No pain                 Malisa Ruggiero

## 2019-08-19 NOTE — Interval H&P Note (Signed)
History and Physical Interval Note:  08/19/2019 9:27 AM  Pamela Smith  has presented today for surgery, with the diagnosis of IDA.  The various methods of treatment have been discussed with the patient and family. After consideration of risks, benefits and other options for treatment, the patient has consented to  Procedure(s): COLONOSCOPY WITH PROPOFOL (N/A) ESOPHAGOGASTRODUODENOSCOPY (EGD) WITH PROPOFOL (N/A) as a surgical intervention.  The patient's history has been reviewed, patient examined, no change in status, stable for surgery.  I have reviewed the patient's chart and labs.  Questions were answered to the patient's satisfaction.     [place], [place]

## 2019-08-19 NOTE — Op Note (Signed)
[facility] Gastroenterology Patient Name: Pamela Smith Procedure Date: 08/19/2019 8:38 AM MRN: [id number] Account #: [id number] Date of Birth: Jun 28, 1946 Admit Type: Outpatient Age: 73 Room: [facility] ENDO ROOM 3 Gender: Female Note Status: Finalized Procedure:             Colonoscopy Indications:           Iron deficiency anemia Providers:             Benay Pike. [place] MD, MD Medicines:             Propofol per Anesthesia Complications:         No immediate complications. Procedure:             Pre-Anesthesia Assessment:                        - The risks and benefits of the procedure and the                         sedation options and risks were discussed with the                         patient. All questions were answered and informed                         consent was obtained.                        - Patient identification and proposed procedure were                         verified prior to the procedure by the nurse. The                         procedure was verified in the procedure room.                        - ASA Grade Assessment: III - A patient with severe                         systemic disease.                        - After reviewing the risks and benefits, the patient                         was deemed in satisfactory condition to undergo the                         procedure.                        After obtaining informed consent, the colonoscope was                         passed under direct vision. Throughout the procedure,                         the patient's blood pressure, pulse, and oxygen  saturations were monitored continuously. The                         Colonoscope was introduced through the anus and                         advanced to the the cecum, identified by appendiceal                         orifice and ileocecal valve. The colonoscopy was                         somewhat difficult due to  significant looping.                         Successful completion of the procedure was aided by                         changing the patient to a prone position. The patient                         tolerated the procedure well. The quality of the bowel                         preparation was adequate. The ileocecal valve,                         appendiceal orifice, and rectum were photographed. Findings:      The perianal and digital rectal examinations were normal. Pertinent       negatives include normal sphincter tone and no palpable rectal lesions.      The ileocecal valve was moderately lipomatous. Biopsies were taken with       a cold forceps for histology.      Non-bleeding internal hemorrhoids were found during retroflexion. The       hemorrhoids were Grade I (internal hemorrhoids that do not prolapse).      The exam was otherwise without abnormality. Impression:            - Lipomatous ileocecal valve. Biopsied.                        - Non-bleeding internal hemorrhoids.                        - The examination was otherwise normal. Recommendation:        - Patient has a contact number available for                         emergencies. The signs and symptoms of potential                         delayed complications were discussed with the patient.                         Return to normal activities tomorrow. Written                         discharge instructions were provided to the patient.                        -  Await pathology results from EGD, also performed                         today.                        - Resume previous diet.                        - Continue present medications.                        - Await pathology results.                        - No repeat colonoscopy due to current age (39 years                         or older) and no evidence of mucosal or other                         abnormalities on today's exam.                        - Consider  capsule endoscopy of the small bowel if not                         previously performed and/or not contraindicated.                        - Return to physician assistant in 6 weeks.                        - The findings and recommendations were discussed with                         the patient. Procedure Code(s):     --- Professional ---                        [id number], Colonoscopy, flexible; with biopsy, single or                         multiple Diagnosis Code(s):     --- Professional ---                        D50.9, Iron deficiency anemia, unspecified                        K64.0, First degree hemorrhoids                        K63.89, Other specified diseases of intestine CPT copyright 2019 American Medical Association. All rights reserved. The codes documented in this report are preliminary and upon coder review may  be revised to meet current compliance requirements. Efrain Sella MD, MD 08/19/2019 9:24:32 AM This report has been signed electronically. Number of Addenda: 0 Note Initiated On: 08/19/2019 8:38 AM Scope Withdrawal Time: 0 hours 6 minutes 45 seconds  Total Procedure Duration: 0 hours 16 minutes 18 seconds  Estimated Blood Loss:  Estimated blood loss: none.  [facility]

## 2019-08-20 ENCOUNTER — Encounter: Payer: Self-pay | Admitting: Internal Medicine

## 2019-08-20 LAB — SURGICAL PATHOLOGY

## 2019-08-21 ENCOUNTER — Other Ambulatory Visit: Payer: Self-pay

## 2019-08-21 ENCOUNTER — Encounter: Payer: Self-pay | Admitting: Oncology

## 2019-08-21 DIAGNOSIS — D509 Iron deficiency anemia, unspecified: Secondary | ICD-10-CM | POA: Insufficient documentation

## 2019-08-21 NOTE — Progress Notes (Signed)
Pamela Smith  [phone] Fax:(336) [phone]  ID: BREANDA EGGLESTON OB: [birth year]  MR#: [id number]  [id number]  Patient Care Team: Tracie Harrier, MD as PCP - General (Internal Medicine)  CHIEF COMPLAINT: Iron deficiency anemia.  INTERVAL HISTORY: Patient is a 73 year old female with a longstanding history of iron deficiency anemia who was recently noted to have a declining hemoglobin and iron stores.  She reports she received IV iron approximately 15 years ago.  Recent colonoscopy and EGD did not reveal any significant pathology.  She currently feels well and is asymptomatic.  She has no neurologic complaints.  She denies any recent fevers or illnesses.  She has good appetite and denies weight loss.  She has no chest pain, shortness of breath, cough, or hemoptysis.  She denies any nausea, vomiting, constipation, or diarrhea.  She has no melena or hematochezia.  She has no urinary complaints.  Patient feels at her baseline offers no specific complaints today.  REVIEW OF SYSTEMS:   Review of Systems  Constitutional: Negative.  Negative for fever, malaise/fatigue and weight loss.  Respiratory: Negative.  Negative for cough, hemoptysis and shortness of breath.   Cardiovascular: Negative.  Negative for chest pain and leg swelling.  Gastrointestinal: Negative.  Negative for abdominal pain, blood in stool and melena.  Genitourinary: Negative.  Negative for hematuria.  Musculoskeletal: Negative.  Negative for back pain.  Skin: Negative for rash.  Neurological: Negative.  Negative for dizziness, focal weakness, weakness and headaches.  Psychiatric/Behavioral: Negative.  The patient is not nervous/anxious.     As per HPI. Otherwise, a complete review of systems is negative.  PAST MEDICAL HISTORY: Past Medical History:  Diagnosis Date  . Anemia   . Anxiety   . Asthma without status asthmaticus   . Depression   . Diabetes mellitus without complication  ([place])   . Dyslipidemia   . Hyperlipidemia   . Hypertension   . Hypothyroidism     PAST SURGICAL HISTORY: Past Surgical History:  Procedure Laterality Date  . APPENDECTOMY    . broken leg    . COLONOSCOPY WITH PROPOFOL N/A 11/21/2015   Procedure: COLONOSCOPY WITH PROPOFOL;  Surgeon: Josefine Class, MD;  Location: [facility] ENDOSCOPY;  Service: Endoscopy;  Laterality: N/A;  . COLONOSCOPY WITH PROPOFOL N/A 08/19/2019   Procedure: COLONOSCOPY WITH PROPOFOL;  Surgeon: Toledo, Benay Pike, MD;  Location: [facility] ENDOSCOPY;  Service: Gastroenterology;  Laterality: N/A;  . ESOPHAGOGASTRODUODENOSCOPY (EGD) WITH PROPOFOL N/A 11/21/2015   Procedure: ESOPHAGOGASTRODUODENOSCOPY (EGD) WITH PROPOFOL;  Surgeon: Josefine Class, MD;  Location: [facility] ENDOSCOPY;  Service: Endoscopy;  Laterality: N/A;  . ESOPHAGOGASTRODUODENOSCOPY (EGD) WITH PROPOFOL N/A 08/19/2019   Procedure: ESOPHAGOGASTRODUODENOSCOPY (EGD) WITH PROPOFOL;  Surgeon: Toledo, Benay Pike, MD;  Location: [facility] ENDOSCOPY;  Service: Gastroenterology;  Laterality: N/A;  . TONSILLECTOMY      FAMILY HISTORY: Family History  Problem Relation Age of Onset  . Breast cancer Maternal Aunt     ADVANCED DIRECTIVES (Y/N):  N  HEALTH MAINTENANCE: Social History   Tobacco Use  . Smoking status: Former Smoker    Types: Cigarettes  . Smokeless tobacco: Never Used  Substance Use Topics  . Alcohol use: Yes    Alcohol/week: 10.0 standard drinks    Types: 5 Glasses of wine, 5 Shots of liquor per week  . Drug use: Never     Colonoscopy:  PAP:  Bone density:  Lipid panel:  Allergies  Allergen Reactions  . Amlodipine   . Carvedilol   . Felodipine  Swelling    Greater than 5 mg  . Lisinopril     Current Outpatient Medications  Medication Sig Dispense Refill  . aspirin 81 MG chewable tablet Chew by mouth daily.    Marland Kitchen atorvastatin (LIPITOR) 10 MG tablet Take 10 mg by mouth daily.    Marland Kitchen buPROPion (WELLBUTRIN SR) 150 MG 12 hr tablet Take 150 mg by  mouth 2 (two) times daily.    . calcium carbonate (TUMS - DOSED IN MG ELEMENTAL CALCIUM) 500 MG chewable tablet Chew 1 tablet by mouth daily.    . Cyanocobalamin (VITAMIN B 12 PO) Take 1 tablet by mouth daily.    . felodipine (PLENDIL) 5 MG 24 hr tablet Take 5 mg by mouth daily.    . Fish Oil-Cholecalciferol (FISH OIL + D3 PO) Take by mouth.    . insulin glargine (LANTUS) 100 UNIT/ML injection Inject 100 Units into the skin as directed.    Marland Kitchen levothyroxine (SYNTHROID, LEVOTHROID) 75 MCG tablet Take 75 mcg by mouth daily before breakfast.    . metFORMIN (GLUCOPHAGE) 1000 MG tablet Take 1,000 mg by mouth 2 (two) times daily with a meal.    . metoprolol tartrate (LOPRESSOR) 25 MG tablet Take 25 mg by mouth 2 (two) times daily.    Marland Kitchen omeprazole (PRILOSEC) 40 MG capsule Take 40 mg by mouth daily.    . sitaGLIPtin (JANUVIA) 100 MG tablet Take 100 mg by mouth daily.     No current facility-administered medications for this visit.     OBJECTIVE: Vitals:   08/24/19 1118  BP: (!) 122/96  Pulse: 82  Resp: 16  Temp: 97.9 F (36.6 C)  SpO2: 98%     Body mass index is 30.98 kg/m.    ECOG FS:0 - Asymptomatic  General: Well-developed, well-nourished, no acute distress. Eyes: Pink conjunctiva, anicteric sclera. HEENT: Normocephalic, moist mucous membranes, clear oropharnyx. Lungs: Clear to auscultation bilaterally. Heart: Regular rate and rhythm. No rubs, murmurs, or gallops. Abdomen: Soft, nontender, nondistended. No organomegaly noted, normoactive bowel sounds. Musculoskeletal: No edema, cyanosis, or clubbing. Neuro: Alert, answering all questions appropriately. Cranial nerves grossly intact. Skin: No rashes or petechiae noted. Psych: Normal affect. Lymphatics: No cervical, calvicular, axillary or inguinal LAD.   LAB RESULTS:  Lab Results  Component Value Date   NA 129 (L) 10/29/2013   K 4.3 10/29/2013   CL 97 (L) 10/29/2013   CO2 28 10/29/2013   GLUCOSE 97 10/29/2013   BUN 9  10/29/2013   CREATININE 0.79 10/29/2013   CALCIUM 9.0 10/29/2013   PROT 6.4 10/25/2013   ALBUMIN 3.3 (L) 10/25/2013   AST 75 (H) 10/25/2013   ALT 50 10/25/2013   ALKPHOS 55 10/25/2013   BILITOT 1.0 10/25/2013   GFRNONAA >60 10/29/2013   GFRAA >60 10/29/2013    Lab Results  Component Value Date   WBC 10.6 10/26/2013   NEUTROABS 7.2 (H) 10/26/2013   HGB 12.2 10/26/2013   HCT 35.3 10/26/2013   MCV 83 10/26/2013   PLT 294 10/26/2013     STUDIES: No results found.  ASSESSMENT: Iron deficiency anemia.  PLAN:    1. Iron deficiency anemia: Patient had colonoscopy and EGD on August 19, 2019 that did not reveal any distinct pathology.  Laboratory work from July 31, 2019 revealed a hemoglobin of 9.8 and a ferritin of 4.  Prior to that, patient was noted to have an iron saturation of 5% and a total iron of 27.  Patient will benefit from 200 mg IV Venofer  and will return later this week for an infusion.  She will then return to clinic in 1 and 2 weeks to receive 2 additional treatments.  Finally, patient will return to clinic in 3 months with repeat laboratory work, further evaluation, and continuation of treatment if necessary.  I spent a total of 45 minutes face-to-face with the patient of which greater than 50% of the visit was spent in counseling and coordination of care as detailed above.   Patient expressed understanding and was in agreement with this plan. She also understands that She can call clinic at any time with any questions, concerns, or complaints.    Lloyd Huger, MD   08/24/2019 1:08 PM

## 2019-08-21 NOTE — Progress Notes (Signed)
Patient pre screened for office appointment, no questions or concerns today. 

## 2019-08-24 ENCOUNTER — Encounter: Payer: Self-pay | Admitting: Oncology

## 2019-08-24 ENCOUNTER — Inpatient Hospital Stay: Payer: Medicare Other | Attending: Oncology | Admitting: Oncology

## 2019-08-24 ENCOUNTER — Other Ambulatory Visit: Payer: Self-pay

## 2019-08-24 DIAGNOSIS — D509 Iron deficiency anemia, unspecified: Secondary | ICD-10-CM | POA: Diagnosis not present

## 2019-08-24 DIAGNOSIS — E119 Type 2 diabetes mellitus without complications: Secondary | ICD-10-CM | POA: Insufficient documentation

## 2019-08-24 DIAGNOSIS — E785 Hyperlipidemia, unspecified: Secondary | ICD-10-CM | POA: Diagnosis not present

## 2019-08-24 DIAGNOSIS — Z87891 Personal history of nicotine dependence: Secondary | ICD-10-CM | POA: Insufficient documentation

## 2019-08-24 DIAGNOSIS — Z79899 Other long term (current) drug therapy: Secondary | ICD-10-CM | POA: Diagnosis not present

## 2019-08-24 DIAGNOSIS — I10 Essential (primary) hypertension: Secondary | ICD-10-CM | POA: Insufficient documentation

## 2019-08-24 DIAGNOSIS — E039 Hypothyroidism, unspecified: Secondary | ICD-10-CM | POA: Diagnosis not present

## 2019-08-24 DIAGNOSIS — Z7984 Long term (current) use of oral hypoglycemic drugs: Secondary | ICD-10-CM

## 2019-08-24 DIAGNOSIS — Z7982 Long term (current) use of aspirin: Secondary | ICD-10-CM | POA: Insufficient documentation

## 2019-08-24 DIAGNOSIS — Z794 Long term (current) use of insulin: Secondary | ICD-10-CM | POA: Diagnosis not present

## 2019-08-24 DIAGNOSIS — Z803 Family history of malignant neoplasm of breast: Secondary | ICD-10-CM | POA: Insufficient documentation

## 2019-08-28 ENCOUNTER — Inpatient Hospital Stay: Payer: Medicare Other

## 2019-08-28 ENCOUNTER — Other Ambulatory Visit: Payer: Self-pay

## 2019-08-28 VITALS — BP 113/52 | HR 59 | Temp 95.3°F | Resp 16

## 2019-08-28 DIAGNOSIS — D509 Iron deficiency anemia, unspecified: Secondary | ICD-10-CM

## 2019-08-28 MED ORDER — SODIUM CHLORIDE 0.9 % IV SOLN
Freq: Once | INTRAVENOUS | Status: AC
  Administered 2019-08-28: 13:00:00 via INTRAVENOUS
  Filled 2019-08-28: qty 250

## 2019-08-28 MED ORDER — IRON SUCROSE 20 MG/ML IV SOLN
200.0000 mg | Freq: Once | INTRAVENOUS | Status: AC
  Administered 2019-08-28: 200 mg via INTRAVENOUS
  Filled 2019-08-28: qty 10

## 2019-09-03 ENCOUNTER — Other Ambulatory Visit: Payer: Self-pay

## 2019-09-04 ENCOUNTER — Inpatient Hospital Stay: Payer: Medicare Other

## 2019-09-04 ENCOUNTER — Other Ambulatory Visit: Payer: Self-pay

## 2019-09-04 VITALS — BP 121/75 | HR 69 | Temp 96.8°F | Resp 18

## 2019-09-04 DIAGNOSIS — D509 Iron deficiency anemia, unspecified: Secondary | ICD-10-CM | POA: Diagnosis not present

## 2019-09-04 MED ORDER — IRON SUCROSE 20 MG/ML IV SOLN
200.0000 mg | Freq: Once | INTRAVENOUS | Status: AC
  Administered 2019-09-04: 14:00:00 200 mg via INTRAVENOUS
  Filled 2019-09-04: qty 10

## 2019-09-04 MED ORDER — SODIUM CHLORIDE 0.9 % IV SOLN
Freq: Once | INTRAVENOUS | Status: AC
  Administered 2019-09-04: 14:00:00 via INTRAVENOUS
  Filled 2019-09-04: qty 250

## 2019-09-18 ENCOUNTER — Other Ambulatory Visit: Payer: Self-pay

## 2019-09-18 ENCOUNTER — Inpatient Hospital Stay: Payer: Medicare Other | Attending: Oncology

## 2019-09-18 VITALS — BP 132/78

## 2019-09-18 DIAGNOSIS — D509 Iron deficiency anemia, unspecified: Secondary | ICD-10-CM | POA: Insufficient documentation

## 2019-09-18 MED ORDER — SODIUM CHLORIDE 0.9 % IV SOLN
Freq: Once | INTRAVENOUS | Status: AC
  Administered 2019-09-18: 15:00:00 via INTRAVENOUS
  Filled 2019-09-18: qty 250

## 2019-09-18 MED ORDER — IRON SUCROSE 20 MG/ML IV SOLN
200.0000 mg | Freq: Once | INTRAVENOUS | Status: AC
  Administered 2019-09-18: 200 mg via INTRAVENOUS
  Filled 2019-09-18: qty 10

## 2019-11-20 NOTE — Progress Notes (Signed)
  [place]  Telephone:(336) [phone] Fax:(336) [phone]  ID: Pamela Smith OB: 11-Nov-1945  MR#: [id number]  [id number]  Patient Care Team: Tracie Harrier, MD as PCP - General (Internal Medicine)    Lloyd Huger, MD   11/28/2019 9:26 AM     This encounter was created in error - please disregard.

## 2019-11-26 ENCOUNTER — Encounter: Payer: Self-pay | Admitting: Oncology

## 2019-11-26 ENCOUNTER — Other Ambulatory Visit: Payer: Self-pay

## 2019-11-26 ENCOUNTER — Inpatient Hospital Stay: Payer: Medicare Other

## 2019-11-26 NOTE — Progress Notes (Signed)
Patient prescreened for appointment. Patient has no concerns or questions.  

## 2019-11-27 ENCOUNTER — Inpatient Hospital Stay: Payer: Medicare Other

## 2019-11-27 ENCOUNTER — Inpatient Hospital Stay: Payer: Medicare Other | Admitting: Oncology

## 2019-12-03 NOTE — Progress Notes (Deleted)
[place]  Telephone:([phone] Fax:(336) [phone]  ID: Pamela Smith OB: 11/25/1945  MR#: [id number]  [id number]  Patient Care Team: Tracie Harrier, MD as PCP - General (Internal Medicine)  CHIEF COMPLAINT: Iron deficiency anemia.  INTERVAL HISTORY: Patient is a 74 year old female with a longstanding history of iron deficiency anemia who was recently noted to have a declining hemoglobin and iron stores.  She reports she received IV iron approximately 15 years ago.  Recent colonoscopy and EGD did not reveal any significant pathology.  She currently feels well and is asymptomatic.  She has no neurologic complaints.  She denies any recent fevers or illnesses.  She has good appetite and denies weight loss.  She has no chest pain, shortness of breath, cough, or hemoptysis.  She denies any nausea, vomiting, constipation, or diarrhea.  She has no melena or hematochezia.  She has no urinary complaints.  Patient feels at her baseline offers no specific complaints today.  REVIEW OF SYSTEMS:   Review of Systems  Constitutional: Negative.  Negative for fever, malaise/fatigue and weight loss.  Respiratory: Negative.  Negative for cough, hemoptysis and shortness of breath.   Cardiovascular: Negative.  Negative for chest pain and leg swelling.  Gastrointestinal: Negative.  Negative for abdominal pain, blood in stool and melena.  Genitourinary: Negative.  Negative for hematuria.  Musculoskeletal: Negative.  Negative for back pain.  Skin: Negative for rash.  Neurological: Negative.  Negative for dizziness, focal weakness, weakness and headaches.  Psychiatric/Behavioral: Negative.  The patient is not nervous/anxious.     As per HPI. Otherwise, a complete review of systems is negative.  PAST MEDICAL HISTORY: Past Medical History:  Diagnosis Date  . Anemia   . Anxiety   . Asthma without status asthmaticus   . Depression   . Diabetes mellitus without complication  ([place])   . Dyslipidemia   . Hyperlipidemia   . Hypertension   . Hypothyroidism     PAST SURGICAL HISTORY: Past Surgical History:  Procedure Laterality Date  . APPENDECTOMY    . broken leg    . COLONOSCOPY WITH PROPOFOL N/A 11/21/2015   Procedure: COLONOSCOPY WITH PROPOFOL;  Surgeon: Josefine Class, MD;  Location: [facility] ENDOSCOPY;  Service: Endoscopy;  Laterality: N/A;  . COLONOSCOPY WITH PROPOFOL N/A 08/19/2019   Procedure: COLONOSCOPY WITH PROPOFOL;  Surgeon: Toledo, Benay Pike, MD;  Location: [facility] ENDOSCOPY;  Service: Gastroenterology;  Laterality: N/A;  . ESOPHAGOGASTRODUODENOSCOPY (EGD) WITH PROPOFOL N/A 11/21/2015   Procedure: ESOPHAGOGASTRODUODENOSCOPY (EGD) WITH PROPOFOL;  Surgeon: Josefine Class, MD;  Location: [facility] ENDOSCOPY;  Service: Endoscopy;  Laterality: N/A;  . ESOPHAGOGASTRODUODENOSCOPY (EGD) WITH PROPOFOL N/A 08/19/2019   Procedure: ESOPHAGOGASTRODUODENOSCOPY (EGD) WITH PROPOFOL;  Surgeon: Toledo, Benay Pike, MD;  Location: [facility] ENDOSCOPY;  Service: Gastroenterology;  Laterality: N/A;  . TONSILLECTOMY      FAMILY HISTORY: Family History  Problem Relation Age of Onset  . Breast cancer Maternal Aunt     ADVANCED DIRECTIVES (Y/N):  N  HEALTH MAINTENANCE: Social History   Tobacco Use  . Smoking status: Former Smoker    Types: Cigarettes  . Smokeless tobacco: Never Used  Substance Use Topics  . Alcohol use: Yes    Alcohol/week: 10.0 standard drinks    Types: 5 Glasses of wine, 5 Shots of liquor per week  . Drug use: Never     Colonoscopy:  PAP:  Bone density:  Lipid panel:  Allergies  Allergen Reactions  . Amlodipine   . Carvedilol   . Felodipine  Swelling    Greater than 5 mg  . Lisinopril     Current Outpatient Medications  Medication Sig Dispense Refill  . aspirin 81 MG chewable tablet Chew by mouth daily.    Marland Kitchen atorvastatin (LIPITOR) 10 MG tablet Take 10 mg by mouth daily.    Marland Kitchen buPROPion (WELLBUTRIN SR) 150 MG 12 hr tablet Take 150 mg by  mouth 2 (two) times daily.    . Cyanocobalamin (B-12) 1000 MCG CAPS     . Cyanocobalamin (VITAMIN B 12 PO) Take 1 tablet by mouth daily.    . felodipine (PLENDIL) 5 MG 24 hr tablet Take 5 mg by mouth daily.    . Fish Oil-Cholecalciferol (FISH OIL + D3 PO) Take by mouth.    . insulin glargine (LANTUS) 100 UNIT/ML injection Inject 100 Units into the skin as directed.    Marland Kitchen levothyroxine (SYNTHROID, LEVOTHROID) 75 MCG tablet Take 75 mcg by mouth daily before breakfast.    . metFORMIN (GLUCOPHAGE) 1000 MG tablet Take 1,000 mg by mouth 2 (two) times daily with a meal.    . metoprolol tartrate (LOPRESSOR) 25 MG tablet Take 25 mg by mouth 2 (two) times daily.    . Omega-3 Fatty Acids (FISH OIL) 1000 MG CAPS Take 1 capsule by mouth 2 (two) times daily.    Marland Kitchen omeprazole (PRILOSEC) 40 MG capsule Take 40 mg by mouth daily.    . sitaGLIPtin (JANUVIA) 100 MG tablet Take 100 mg by mouth daily.     No current facility-administered medications for this visit.    OBJECTIVE: There were no vitals filed for this visit.   There is no height or weight on file to calculate BMI.    ECOG FS:0 - Asymptomatic  General: Well-developed, well-nourished, no acute distress. Eyes: Pink conjunctiva, anicteric sclera. HEENT: Normocephalic, moist mucous membranes, clear oropharnyx. Lungs: Clear to auscultation bilaterally. Heart: Regular rate and rhythm. No rubs, murmurs, or gallops. Abdomen: Soft, nontender, nondistended. No organomegaly noted, normoactive bowel sounds. Musculoskeletal: No edema, cyanosis, or clubbing. Neuro: Alert, answering all questions appropriately. Cranial nerves grossly intact. Skin: No rashes or petechiae noted. Psych: Normal affect. Lymphatics: No cervical, calvicular, axillary or inguinal LAD.   LAB RESULTS:  Lab Results  Component Value Date   NA 129 (L) 10/29/2013   K 4.3 10/29/2013   CL 97 (L) 10/29/2013   CO2 28 10/29/2013   GLUCOSE 97 10/29/2013   BUN 9 10/29/2013   CREATININE  0.79 10/29/2013   CALCIUM 9.0 10/29/2013   PROT 6.4 10/25/2013   ALBUMIN 3.3 (L) 10/25/2013   AST 75 (H) 10/25/2013   ALT 50 10/25/2013   ALKPHOS 55 10/25/2013   BILITOT 1.0 10/25/2013   GFRNONAA >60 10/29/2013   GFRAA >60 10/29/2013    Lab Results  Component Value Date   WBC 10.6 10/26/2013   NEUTROABS 7.2 (H) 10/26/2013   HGB 12.2 10/26/2013   HCT 35.3 10/26/2013   MCV 83 10/26/2013   PLT 294 10/26/2013     STUDIES: No results found.  ASSESSMENT: Iron deficiency anemia.  PLAN:    1. Iron deficiency anemia: Patient had colonoscopy and EGD on August 19, 2019 that did not reveal any distinct pathology.  Laboratory work from July 31, 2019 revealed a hemoglobin of 9.8 and a ferritin of 4.  Prior to that, patient was noted to have an iron saturation of 5% and a total iron of 27.  Patient will benefit from 200 mg IV Venofer and will return later this week  for an infusion.  She will then return to clinic in 1 and 2 weeks to receive 2 additional treatments.  Finally, patient will return to clinic in 3 months with repeat laboratory work, further evaluation, and continuation of treatment if necessary.  I spent a total of 45 minutes face-to-face with the patient of which greater than 50% of the visit was spent in counseling and coordination of care as detailed above.   Patient expressed understanding and was in agreement with this plan. She also understands that She can call clinic at any time with any questions, concerns, or complaints.    Lloyd Huger, MD   12/03/2019 3:01 PM

## 2019-12-09 ENCOUNTER — Inpatient Hospital Stay: Payer: Medicare Other

## 2019-12-09 ENCOUNTER — Encounter: Payer: Self-pay | Admitting: Oncology

## 2019-12-10 ENCOUNTER — Inpatient Hospital Stay: Payer: Medicare Other | Admitting: Oncology

## 2019-12-10 ENCOUNTER — Inpatient Hospital Stay: Payer: Medicare Other

## 2019-12-20 NOTE — Progress Notes (Signed)
[place]  [phone]) [phone] [phone]) [phone]  ID: Pamela Smith OB: 04-05-1946  MR#: [id number]  [id number]  Patient Care Team: Tracie Harrier, MD as PCP - General (Internal Medicine)  CHIEF COMPLAINT: Iron deficiency anemia.  INTERVAL HISTORY: Patient returns to clinic today for repeat laboratory work and further evaluation.  After receiving IV Feraheme several months ago, she feels improved.  She does not complain of any weakness or fatigue today.  She currently feels well and is asymptomatic.  She has no neurologic complaints.  She denies any recent fevers or illnesses.  She has good appetite and denies weight loss.  She has no chest pain, shortness of breath, cough, or hemoptysis.  She denies any nausea, vomiting, constipation, or diarrhea.  She has no melena or hematochezia.  She has no urinary complaints.  Patient offers no specific complaints today.  REVIEW OF SYSTEMS:   Review of Systems  Constitutional: Negative.  Negative for fever, malaise/fatigue and weight loss.  Respiratory: Negative.  Negative for cough, hemoptysis and shortness of breath.   Cardiovascular: Negative.  Negative for chest pain and leg swelling.  Gastrointestinal: Negative.  Negative for abdominal pain, blood in stool and melena.  Genitourinary: Negative.  Negative for hematuria.  Musculoskeletal: Negative.  Negative for back pain.  Skin: Negative for rash.  Neurological: Negative.  Negative for dizziness, focal weakness, weakness and headaches.  Psychiatric/Behavioral: Negative.  The patient is not nervous/anxious.     As per HPI. Otherwise, a complete review of systems is negative.  PAST MEDICAL HISTORY: Past Medical History:  Diagnosis Date  . Anemia   . Anxiety   . Asthma without status asthmaticus   . Depression   . Diabetes mellitus without complication ([place])   . Dyslipidemia   . Hyperlipidemia   . Hypertension   . Hypothyroidism     PAST SURGICAL  HISTORY: Past Surgical History:  Procedure Laterality Date  . APPENDECTOMY    . broken leg    . COLONOSCOPY WITH PROPOFOL N/A 11/21/2015   Procedure: COLONOSCOPY WITH PROPOFOL;  Surgeon: Josefine Class, MD;  Location: [facility] ENDOSCOPY;  Service: Endoscopy;  Laterality: N/A;  . COLONOSCOPY WITH PROPOFOL N/A 08/19/2019   Procedure: COLONOSCOPY WITH PROPOFOL;  Surgeon: Toledo, Benay Pike, MD;  Location: [facility] ENDOSCOPY;  Service: Gastroenterology;  Laterality: N/A;  . ESOPHAGOGASTRODUODENOSCOPY (EGD) WITH PROPOFOL N/A 11/21/2015   Procedure: ESOPHAGOGASTRODUODENOSCOPY (EGD) WITH PROPOFOL;  Surgeon: Josefine Class, MD;  Location: [facility] ENDOSCOPY;  Service: Endoscopy;  Laterality: N/A;  . ESOPHAGOGASTRODUODENOSCOPY (EGD) WITH PROPOFOL N/A 08/19/2019   Procedure: ESOPHAGOGASTRODUODENOSCOPY (EGD) WITH PROPOFOL;  Surgeon: Toledo, Benay Pike, MD;  Location: [facility] ENDOSCOPY;  Service: Gastroenterology;  Laterality: N/A;  . TONSILLECTOMY      FAMILY HISTORY: Family History  Problem Relation Age of Onset  . Breast cancer Maternal Aunt     ADVANCED DIRECTIVES (Y/N):  N  HEALTH MAINTENANCE: Social History   Tobacco Use  . Smoking status: Former Smoker    Types: Cigarettes  . Smokeless tobacco: Never Used  Substance Use Topics  . Alcohol use: Yes    Alcohol/week: 10.0 standard drinks    Types: 5 Glasses of wine, 5 Shots of liquor per week  . Drug use: Never     Colonoscopy:  PAP:  Bone density:  Lipid panel:  Allergies  Allergen Reactions  . Amlodipine   . Carvedilol   . Felodipine Swelling    Greater than 5 mg  . Lisinopril     Current Outpatient  Medications  Medication Sig Dispense Refill  . aspirin 81 MG chewable tablet Chew by mouth daily.    Marland Kitchen atorvastatin (LIPITOR) 10 MG tablet Take 10 mg by mouth daily.    Marland Kitchen buPROPion (WELLBUTRIN SR) 150 MG 12 hr tablet Take 150 mg by mouth 2 (two) times daily.    . Cyanocobalamin (VITAMIN B 12 PO) Take 1 tablet by mouth daily.    .  felodipine (PLENDIL) 5 MG 24 hr tablet Take 5 mg by mouth daily.    . insulin glargine (LANTUS) 100 UNIT/ML injection Inject 50 Units into the skin as directed.     Marland Kitchen levothyroxine (SYNTHROID, LEVOTHROID) 75 MCG tablet Take 75 mcg by mouth daily before breakfast.    . metFORMIN (GLUCOPHAGE) 1000 MG tablet Take 1,000 mg by mouth 2 (two) times daily with a meal.    . metoprolol tartrate (LOPRESSOR) 25 MG tablet Take 25 mg by mouth 2 (two) times daily.    . Omega-3 Fatty Acids (FISH OIL) 1000 MG CAPS Take 1 capsule by mouth 2 (two) times daily.    Marland Kitchen omeprazole (PRILOSEC) 40 MG capsule Take 40 mg by mouth daily.    . sitaGLIPtin (JANUVIA) 100 MG tablet Take 100 mg by mouth daily.     No current facility-administered medications for this visit.    OBJECTIVE: Vitals:   12/24/19 1121  BP: (!) 158/69  Pulse: 88  Resp: 18  Temp: (!) 97.5 F (36.4 C)  SpO2: 100%     Body mass index is 30.73 kg/m.    ECOG FS:0 - Asymptomatic  General: Well-developed, well-nourished, no acute distress. Eyes: Pink conjunctiva, anicteric sclera. HEENT: Normocephalic, moist mucous membranes. Lungs: No audible wheezing or coughing. Heart: Regular rate and rhythm. Abdomen: Soft, nontender, no obvious distention. Musculoskeletal: No edema, cyanosis, or clubbing. Neuro: Alert, answering all questions appropriately. Cranial nerves grossly intact. Skin: No rashes or petechiae noted. Psych: Normal affect.  LAB RESULTS:  Lab Results  Component Value Date   NA 129 (L) 10/29/2013   K 4.3 10/29/2013   CL 97 (L) 10/29/2013   CO2 28 10/29/2013   GLUCOSE 97 10/29/2013   BUN 9 10/29/2013   CREATININE 0.79 10/29/2013   CALCIUM 9.0 10/29/2013   PROT 6.4 10/25/2013   ALBUMIN 3.3 (L) 10/25/2013   AST 75 (H) 10/25/2013   ALT 50 10/25/2013   ALKPHOS 55 10/25/2013   BILITOT 1.0 10/25/2013   GFRNONAA >60 10/29/2013   GFRAA >60 10/29/2013    Lab Results  Component Value Date   WBC 9.6 12/22/2019   NEUTROABS 6.1  12/22/2019   HGB 12.2 12/22/2019   HCT 39.7 12/22/2019   MCV 88.6 12/22/2019   PLT 257 12/22/2019   Lab Results  Component Value Date   IRON 53 12/22/2019   TIBC 424 12/22/2019   IRONPCTSAT 13 12/22/2019   Lab Results  Component Value Date   FERRITIN 12 12/22/2019     STUDIES: No results found.  ASSESSMENT: Iron deficiency anemia.  PLAN:    1. Iron deficiency anemia: Patient had colonoscopy and EGD on August 19, 2019 that did not reveal any distinct pathology.  Patient's hemoglobin and iron stores are now within normal limits.  She does not require additional IV Venofer today.  She last received treatment on September 18, 2019.  Return to clinic in 4 months with repeat laboratory work, further evaluation, and additional treatment if necessary.    I spent a total of 20 minutes reviewing chart data, face-to-face evaluation  with the patient, counseling and coordination of care as detailed above.   Patient expressed understanding and was in agreement with this plan. She also understands that She can call clinic at any time with any questions, concerns, or complaints.    Lloyd Huger, MD   12/24/2019 1:36 PM

## 2019-12-22 ENCOUNTER — Other Ambulatory Visit: Payer: Self-pay

## 2019-12-22 ENCOUNTER — Inpatient Hospital Stay: Payer: Medicare Other | Attending: Oncology

## 2019-12-22 DIAGNOSIS — Z7982 Long term (current) use of aspirin: Secondary | ICD-10-CM | POA: Diagnosis not present

## 2019-12-22 DIAGNOSIS — Z803 Family history of malignant neoplasm of breast: Secondary | ICD-10-CM | POA: Insufficient documentation

## 2019-12-22 DIAGNOSIS — I10 Essential (primary) hypertension: Secondary | ICD-10-CM | POA: Insufficient documentation

## 2019-12-22 DIAGNOSIS — E039 Hypothyroidism, unspecified: Secondary | ICD-10-CM | POA: Diagnosis not present

## 2019-12-22 DIAGNOSIS — F329 Major depressive disorder, single episode, unspecified: Secondary | ICD-10-CM | POA: Diagnosis not present

## 2019-12-22 DIAGNOSIS — Z79899 Other long term (current) drug therapy: Secondary | ICD-10-CM | POA: Insufficient documentation

## 2019-12-22 DIAGNOSIS — E785 Hyperlipidemia, unspecified: Secondary | ICD-10-CM | POA: Insufficient documentation

## 2019-12-22 DIAGNOSIS — E119 Type 2 diabetes mellitus without complications: Secondary | ICD-10-CM | POA: Insufficient documentation

## 2019-12-22 DIAGNOSIS — Z794 Long term (current) use of insulin: Secondary | ICD-10-CM | POA: Insufficient documentation

## 2019-12-22 DIAGNOSIS — F419 Anxiety disorder, unspecified: Secondary | ICD-10-CM | POA: Insufficient documentation

## 2019-12-22 DIAGNOSIS — Z87891 Personal history of nicotine dependence: Secondary | ICD-10-CM | POA: Insufficient documentation

## 2019-12-22 DIAGNOSIS — D509 Iron deficiency anemia, unspecified: Secondary | ICD-10-CM | POA: Insufficient documentation

## 2019-12-22 LAB — IRON AND TIBC
Iron: 53 ug/dL (ref 28–170)
Saturation Ratios: 13 % (ref 10.4–31.8)
TIBC: 424 ug/dL (ref 250–450)
UIBC: 371 ug/dL

## 2019-12-22 LAB — CBC WITH DIFFERENTIAL/PLATELET
Abs Immature Granulocytes: 0.04 10*3/uL (ref 0.00–0.07)
Basophils Absolute: 0 10*3/uL (ref 0.0–0.1)
Basophils Relative: 0 %
Eosinophils Absolute: 0.4 10*3/uL (ref 0.0–0.5)
Eosinophils Relative: 5 %
HCT: 39.7 % (ref 36.0–46.0)
Hemoglobin: 12.2 g/dL (ref 12.0–15.0)
Immature Granulocytes: 0 %
Lymphocytes Relative: 24 %
Lymphs Abs: 2.3 10*3/uL (ref 0.7–4.0)
MCH: 27.2 pg (ref 26.0–34.0)
MCHC: 30.7 g/dL (ref 30.0–36.0)
MCV: 88.6 fL (ref 80.0–100.0)
Monocytes Absolute: 0.7 10*3/uL (ref 0.1–1.0)
Monocytes Relative: 7 %
Neutro Abs: 6.1 10*3/uL (ref 1.7–7.7)
Neutrophils Relative %: 64 %
Platelets: 257 10*3/uL (ref 150–400)
RBC: 4.48 MIL/uL (ref 3.87–5.11)
RDW: 15.1 % (ref 11.5–15.5)
WBC: 9.6 10*3/uL (ref 4.0–10.5)
nRBC: 0 % (ref 0.0–0.2)

## 2019-12-22 LAB — FERRITIN: Ferritin: 12 ng/mL (ref 11–307)

## 2019-12-24 ENCOUNTER — Inpatient Hospital Stay (HOSPITAL_BASED_OUTPATIENT_CLINIC_OR_DEPARTMENT_OTHER): Payer: Medicare Other | Admitting: Oncology

## 2019-12-24 ENCOUNTER — Inpatient Hospital Stay: Payer: Medicare Other

## 2019-12-24 ENCOUNTER — Encounter: Payer: Self-pay | Admitting: Oncology

## 2019-12-24 VITALS — BP 158/69 | HR 88 | Temp 97.5°F | Resp 18 | Wt 179.0 lb

## 2019-12-24 DIAGNOSIS — D509 Iron deficiency anemia, unspecified: Secondary | ICD-10-CM | POA: Diagnosis not present

## 2019-12-24 DIAGNOSIS — D500 Iron deficiency anemia secondary to blood loss (chronic): Secondary | ICD-10-CM | POA: Diagnosis not present

## 2020-02-24 ENCOUNTER — Telehealth: Payer: Self-pay | Admitting: Oncology

## 2020-02-24 NOTE — Telephone Encounter (Signed)
Provider out of office and new appts mailed.

## 2020-03-31 ENCOUNTER — Other Ambulatory Visit: Payer: Self-pay | Admitting: Internal Medicine

## 2020-03-31 DIAGNOSIS — Z1231 Encounter for screening mammogram for malignant neoplasm of breast: Secondary | ICD-10-CM

## 2020-04-25 ENCOUNTER — Other Ambulatory Visit: Payer: Medicare Other

## 2020-04-26 ENCOUNTER — Ambulatory Visit: Payer: Medicare Other | Admitting: Oncology

## 2020-04-26 ENCOUNTER — Ambulatory Visit: Payer: Medicare Other

## 2020-05-07 NOTE — Progress Notes (Signed)
[place]  Telephone:(336) [phone] [phone]) [phone]  ID: Pamela Smith OB: [birth year]  MR#: [id number]  OIZ#:[id number]  Patient Care Team: Tracie Harrier, MD as PCP - General (Internal Medicine)  CHIEF COMPLAINT: Iron deficiency anemia.  INTERVAL HISTORY: Patient returns to clinic today for repeat laboratory work, further evaluation, and consideration of additional IV Venofer.  She currently feels well and is asymptomatic.  She does not complain of any weakness or fatigue. She has no neurologic complaints.  She denies any recent fevers or illnesses.  She has good appetite and denies weight loss.  She has no chest pain, shortness of breath, cough, or hemoptysis.  She denies any nausea, vomiting, constipation, or diarrhea.  She has no melena or hematochezia.  She has no urinary complaints.  Patient offers no specific complaints today.  REVIEW OF SYSTEMS:   Review of Systems  Constitutional: Negative.  Negative for fever, malaise/fatigue and weight loss.  Respiratory: Negative.  Negative for cough, hemoptysis and shortness of breath.   Cardiovascular: Negative.  Negative for chest pain and leg swelling.  Gastrointestinal: Negative.  Negative for abdominal pain, blood in stool and melena.  Genitourinary: Negative.  Negative for hematuria.  Musculoskeletal: Negative.  Negative for back pain.  Skin: Negative for rash.  Neurological: Negative.  Negative for dizziness, focal weakness, weakness and headaches.  Psychiatric/Behavioral: Negative.  The patient is not nervous/anxious.     As per HPI. Otherwise, a complete review of systems is negative.  PAST MEDICAL HISTORY: Past Medical History:  Diagnosis Date  . Anemia   . Anxiety   . Asthma without status asthmaticus   . Depression   . Diabetes mellitus without complication ([place])   . Dyslipidemia   . Hyperlipidemia   . Hypertension   . Hypothyroidism     PAST SURGICAL HISTORY: Past Surgical History:   Procedure Laterality Date  . APPENDECTOMY    . broken leg    . COLONOSCOPY WITH PROPOFOL N/A 11/21/2015   Procedure: COLONOSCOPY WITH PROPOFOL;  Surgeon: Josefine Class, MD;  Location: [facility] ENDOSCOPY;  Service: Endoscopy;  Laterality: N/A;  . COLONOSCOPY WITH PROPOFOL N/A 08/19/2019   Procedure: COLONOSCOPY WITH PROPOFOL;  Surgeon: Toledo, Benay Pike, MD;  Location: [facility] ENDOSCOPY;  Service: Gastroenterology;  Laterality: N/A;  . ESOPHAGOGASTRODUODENOSCOPY (EGD) WITH PROPOFOL N/A 11/21/2015   Procedure: ESOPHAGOGASTRODUODENOSCOPY (EGD) WITH PROPOFOL;  Surgeon: Josefine Class, MD;  Location: [facility] ENDOSCOPY;  Service: Endoscopy;  Laterality: N/A;  . ESOPHAGOGASTRODUODENOSCOPY (EGD) WITH PROPOFOL N/A 08/19/2019   Procedure: ESOPHAGOGASTRODUODENOSCOPY (EGD) WITH PROPOFOL;  Surgeon: Toledo, Benay Pike, MD;  Location: [facility] ENDOSCOPY;  Service: Gastroenterology;  Laterality: N/A;  . TONSILLECTOMY      FAMILY HISTORY: Family History  Problem Relation Age of Onset  . Breast cancer Maternal Aunt     ADVANCED DIRECTIVES (Y/N):  N  HEALTH MAINTENANCE: Social History   Tobacco Use  . Smoking status: Former Smoker    Types: Cigarettes  . Smokeless tobacco: Never Used  Vaping Use  . Vaping Use: Never used  Substance Use Topics  . Alcohol use: Yes    Alcohol/week: 10.0 standard drinks    Types: 5 Glasses of wine, 5 Shots of liquor per week  . Drug use: Never     Colonoscopy:  PAP:  Bone density:  Lipid panel:  Allergies  Allergen Reactions  . Amlodipine   . Carvedilol   . Felodipine Swelling    Greater than 5 mg  . Lisinopril     Current  Outpatient Medications  Medication Sig Dispense Refill  . aspirin 81 MG chewable tablet Chew by mouth daily.    Marland Kitchen atorvastatin (LIPITOR) 10 MG tablet Take 10 mg by mouth daily.    Marland Kitchen buPROPion (WELLBUTRIN SR) 150 MG 12 hr tablet Take 150 mg by mouth 2 (two) times daily.    . Cyanocobalamin (VITAMIN B 12 PO) Take 1 tablet by mouth daily.     . felodipine (PLENDIL) 5 MG 24 hr tablet Take 5 mg by mouth daily.    . insulin glargine (LANTUS) 100 UNIT/ML injection Inject 50 Units into the skin as directed.     Marland Kitchen levothyroxine (SYNTHROID, LEVOTHROID) 75 MCG tablet Take 75 mcg by mouth daily before breakfast.    . metFORMIN (GLUCOPHAGE) 1000 MG tablet Take 1,000 mg by mouth 2 (two) times daily with a meal.    . metoprolol tartrate (LOPRESSOR) 25 MG tablet Take 25 mg by mouth 2 (two) times daily.    . Omega-3 Fatty Acids (FISH OIL) 1000 MG CAPS Take 1 capsule by mouth 2 (two) times daily.    Marland Kitchen omeprazole (PRILOSEC) 40 MG capsule Take 40 mg by mouth daily.    . sitaGLIPtin (JANUVIA) 100 MG tablet Take 100 mg by mouth daily.     No current facility-administered medications for this visit.    OBJECTIVE: Vitals:   05/10/20 1332  BP: (!) 129/66  Pulse: 74  Resp: 16  Temp: (!) 96.4 F (35.8 C)  SpO2: 98%     Body mass index is 30.45 kg/m.    ECOG FS:0 - Asymptomatic  General: Well-developed, well-nourished, no acute distress. Eyes: Pink conjunctiva, anicteric sclera. HEENT: Normocephalic, moist mucous membranes. Lungs: No audible wheezing or coughing. Heart: Regular rate and rhythm. Abdomen: Soft, nontender, no obvious distention. Musculoskeletal: No edema, cyanosis, or clubbing. Neuro: Alert, answering all questions appropriately. Cranial nerves grossly intact. Skin: No rashes or petechiae noted. Psych: Normal affect.   LAB RESULTS:  Lab Results  Component Value Date   NA 129 (L) 10/29/2013   K 4.3 10/29/2013   CL 97 (L) 10/29/2013   CO2 28 10/29/2013   GLUCOSE 97 10/29/2013   BUN 9 10/29/2013   CREATININE 0.79 10/29/2013   CALCIUM 9.0 10/29/2013   PROT 6.4 10/25/2013   ALBUMIN 3.3 (L) 10/25/2013   AST 75 (H) 10/25/2013   ALT 50 10/25/2013   ALKPHOS 55 10/25/2013   BILITOT 1.0 10/25/2013   GFRNONAA >60 10/29/2013   GFRAA >60 10/29/2013    Lab Results  Component Value Date   WBC 10.1 05/09/2020    NEUTROABS 5.9 05/09/2020   HGB 12.2 05/09/2020   HCT 38.0 05/09/2020   MCV 85.0 05/09/2020   PLT 295 05/09/2020   Lab Results  Component Value Date   IRON 59 05/09/2020   TIBC 507 (H) 05/09/2020   IRONPCTSAT 12 05/09/2020   Lab Results  Component Value Date   FERRITIN 6 (L) 05/09/2020     STUDIES: No results found.  ASSESSMENT: Iron deficiency anemia.  PLAN:    1. Iron deficiency anemia: Patient had colonoscopy and EGD on August 19, 2019 that did not reveal any distinct pathology.  Patient's hemoglobin continues to be within normal limits.  She has a mildly decreased ferritin level, but otherwise her iron panel is within normal limits.  No intervention is needed.  She does not require additional IV Venofer today.  She last received treatment on September 18, 2019.  Return to clinic in 4 months with repeat  laboratory, further evaluation, and continuation of treatment if needed.  I spent a total of 20 minutes reviewing chart data, face-to-face evaluation with the patient, counseling and coordination of care as detailed above.   Patient expressed understanding and was in agreement with this plan. She also understands that She can call clinic at any time with any questions, concerns, or complaints.    Lloyd Huger, MD   05/12/2020 6:24 AM

## 2020-05-09 ENCOUNTER — Inpatient Hospital Stay: Payer: Medicare Other | Attending: Oncology

## 2020-05-09 ENCOUNTER — Other Ambulatory Visit: Payer: Self-pay

## 2020-05-09 DIAGNOSIS — F329 Major depressive disorder, single episode, unspecified: Secondary | ICD-10-CM | POA: Diagnosis not present

## 2020-05-09 DIAGNOSIS — F419 Anxiety disorder, unspecified: Secondary | ICD-10-CM | POA: Insufficient documentation

## 2020-05-09 DIAGNOSIS — I10 Essential (primary) hypertension: Secondary | ICD-10-CM | POA: Diagnosis not present

## 2020-05-09 DIAGNOSIS — Z803 Family history of malignant neoplasm of breast: Secondary | ICD-10-CM | POA: Diagnosis not present

## 2020-05-09 DIAGNOSIS — Z794 Long term (current) use of insulin: Secondary | ICD-10-CM | POA: Diagnosis not present

## 2020-05-09 DIAGNOSIS — Z7982 Long term (current) use of aspirin: Secondary | ICD-10-CM | POA: Diagnosis not present

## 2020-05-09 DIAGNOSIS — Z87891 Personal history of nicotine dependence: Secondary | ICD-10-CM | POA: Diagnosis not present

## 2020-05-09 DIAGNOSIS — Z79899 Other long term (current) drug therapy: Secondary | ICD-10-CM | POA: Diagnosis not present

## 2020-05-09 DIAGNOSIS — E039 Hypothyroidism, unspecified: Secondary | ICD-10-CM | POA: Diagnosis not present

## 2020-05-09 DIAGNOSIS — D509 Iron deficiency anemia, unspecified: Secondary | ICD-10-CM | POA: Insufficient documentation

## 2020-05-09 DIAGNOSIS — E119 Type 2 diabetes mellitus without complications: Secondary | ICD-10-CM | POA: Insufficient documentation

## 2020-05-09 DIAGNOSIS — E785 Hyperlipidemia, unspecified: Secondary | ICD-10-CM | POA: Insufficient documentation

## 2020-05-09 LAB — CBC WITH DIFFERENTIAL/PLATELET
Abs Immature Granulocytes: 0.03 10*3/uL (ref 0.00–0.07)
Basophils Absolute: 0.1 10*3/uL (ref 0.0–0.1)
Basophils Relative: 1 %
Eosinophils Absolute: 0.4 10*3/uL (ref 0.0–0.5)
Eosinophils Relative: 4 %
HCT: 38 % (ref 36.0–46.0)
Hemoglobin: 12.2 g/dL (ref 12.0–15.0)
Immature Granulocytes: 0 %
Lymphocytes Relative: 27 %
Lymphs Abs: 2.7 10*3/uL (ref 0.7–4.0)
MCH: 27.3 pg (ref 26.0–34.0)
MCHC: 32.1 g/dL (ref 30.0–36.0)
MCV: 85 fL (ref 80.0–100.0)
Monocytes Absolute: 1 10*3/uL (ref 0.1–1.0)
Monocytes Relative: 10 %
Neutro Abs: 5.9 10*3/uL (ref 1.7–7.7)
Neutrophils Relative %: 58 %
Platelets: 295 10*3/uL (ref 150–400)
RBC: 4.47 MIL/uL (ref 3.87–5.11)
RDW: 14 % (ref 11.5–15.5)
WBC: 10.1 10*3/uL (ref 4.0–10.5)
nRBC: 0 % (ref 0.0–0.2)

## 2020-05-09 LAB — IRON AND TIBC
Iron: 59 ug/dL (ref 28–170)
Saturation Ratios: 12 % (ref 10.4–31.8)
TIBC: 507 ug/dL — ABNORMAL HIGH (ref 250–450)
UIBC: 448 ug/dL

## 2020-05-09 LAB — FERRITIN: Ferritin: 6 ng/mL — ABNORMAL LOW (ref 11–307)

## 2020-05-10 ENCOUNTER — Inpatient Hospital Stay (HOSPITAL_BASED_OUTPATIENT_CLINIC_OR_DEPARTMENT_OTHER): Payer: Medicare Other | Admitting: Oncology

## 2020-05-10 ENCOUNTER — Inpatient Hospital Stay: Payer: Medicare Other

## 2020-05-10 ENCOUNTER — Encounter: Payer: Self-pay | Admitting: Oncology

## 2020-05-10 VITALS — BP 129/66 | HR 74 | Temp 96.4°F | Resp 16 | Wt 177.4 lb

## 2020-05-10 DIAGNOSIS — D509 Iron deficiency anemia, unspecified: Secondary | ICD-10-CM

## 2020-05-17 ENCOUNTER — Other Ambulatory Visit: Payer: Self-pay

## 2020-05-17 ENCOUNTER — Ambulatory Visit
Admission: RE | Admit: 2020-05-17 | Discharge: 2020-05-17 | Disposition: A | Discharge status: Home / Self Care | Payer: Medicare Other | Source: Ambulatory Visit | Attending: Internal Medicine | Admitting: Internal Medicine

## 2020-05-17 DIAGNOSIS — Z1231 Encounter for screening mammogram for malignant neoplasm of breast: Secondary | ICD-10-CM

## 2020-08-24 ENCOUNTER — Encounter: Payer: Self-pay | Admitting: Oncology

## 2020-09-14 ENCOUNTER — Other Ambulatory Visit: Payer: Medicare Other

## 2020-09-15 ENCOUNTER — Inpatient Hospital Stay: Payer: Medicare Other | Attending: Oncology

## 2020-09-15 ENCOUNTER — Other Ambulatory Visit: Payer: Self-pay

## 2020-09-15 ENCOUNTER — Ambulatory Visit: Payer: Medicare Other

## 2020-09-15 ENCOUNTER — Ambulatory Visit: Payer: Medicare Other | Admitting: Oncology

## 2020-09-15 DIAGNOSIS — D509 Iron deficiency anemia, unspecified: Secondary | ICD-10-CM

## 2020-09-15 LAB — CBC WITH DIFFERENTIAL/PLATELET
Abs Immature Granulocytes: 0.03 10*3/uL (ref 0.00–0.07)
Basophils Absolute: 0.1 10*3/uL (ref 0.0–0.1)
Basophils Relative: 1 %
Eosinophils Absolute: 0.4 10*3/uL (ref 0.0–0.5)
Eosinophils Relative: 5 %
HCT: 40.3 % (ref 36.0–46.0)
Hemoglobin: 12.9 g/dL (ref 12.0–15.0)
Immature Granulocytes: 0 %
Lymphocytes Relative: 25 %
Lymphs Abs: 2.3 10*3/uL (ref 0.7–4.0)
MCH: 28.4 pg (ref 26.0–34.0)
MCHC: 32 g/dL (ref 30.0–36.0)
MCV: 88.8 fL (ref 80.0–100.0)
Monocytes Absolute: 0.9 10*3/uL (ref 0.1–1.0)
Monocytes Relative: 10 %
Neutro Abs: 5.6 10*3/uL (ref 1.7–7.7)
Neutrophils Relative %: 59 %
Platelets: 298 10*3/uL (ref 150–400)
RBC: 4.54 MIL/uL (ref 3.87–5.11)
RDW: 15.8 % — ABNORMAL HIGH (ref 11.5–15.5)
WBC: 9.4 10*3/uL (ref 4.0–10.5)
nRBC: 0 % (ref 0.0–0.2)

## 2020-09-15 LAB — IRON AND TIBC
Iron: 28 ug/dL (ref 28–170)
Saturation Ratios: 7 % — ABNORMAL LOW (ref 10.4–31.8)
TIBC: 428 ug/dL (ref 250–450)
UIBC: 400 ug/dL

## 2020-09-15 LAB — FERRITIN: Ferritin: 6 ng/mL — ABNORMAL LOW (ref 11–307)

## 2020-09-16 ENCOUNTER — Encounter: Payer: Self-pay | Admitting: Oncology

## 2020-09-16 ENCOUNTER — Ambulatory Visit: Payer: Medicare Other | Admitting: Oncology

## 2020-09-16 ENCOUNTER — Ambulatory Visit: Payer: Medicare Other

## 2020-09-17 NOTE — Progress Notes (Deleted)
[place]  Telephone:(336) [phone] Fax:(336) [phone]  ID: DARL BRISBIN OB: Mar 18, 1946  MR#: [id number]  YFV#:[id number]  Patient Care Team: Tracie Harrier, MD as PCP - General (Internal Medicine)  CHIEF COMPLAINT: Iron deficiency anemia.  INTERVAL HISTORY: Patient returns to clinic today for repeat laboratory work, further evaluation, and consideration of additional IV Venofer.  She currently feels well and is asymptomatic.  She does not complain of any weakness or fatigue. She has no neurologic complaints.  She denies any recent fevers or illnesses.  She has good appetite and denies weight loss.  She has no chest pain, shortness of breath, cough, or hemoptysis.  She denies any nausea, vomiting, constipation, or diarrhea.  She has no melena or hematochezia.  She has no urinary complaints.  Patient offers no specific complaints today.  REVIEW OF SYSTEMS:   Review of Systems  Constitutional: Negative.  Negative for fever, malaise/fatigue and weight loss.  Respiratory: Negative.  Negative for cough, hemoptysis and shortness of breath.   Cardiovascular: Negative.  Negative for chest pain and leg swelling.  Gastrointestinal: Negative.  Negative for abdominal pain, blood in stool and melena.  Genitourinary: Negative.  Negative for hematuria.  Musculoskeletal: Negative.  Negative for back pain.  Skin: Negative for rash.  Neurological: Negative.  Negative for dizziness, focal weakness, weakness and headaches.  Psychiatric/Behavioral: Negative.  The patient is not nervous/anxious.     As per HPI. Otherwise, a complete review of systems is negative.  PAST MEDICAL HISTORY: Past Medical History:  Diagnosis Date  . Anemia   . Anxiety   . Asthma without status asthmaticus   . Depression   . Diabetes mellitus without complication ([place])   . Dyslipidemia   . Hyperlipidemia   . Hypertension   . Hypothyroidism     PAST SURGICAL HISTORY: Past Surgical History:   Procedure Laterality Date  . APPENDECTOMY    . broken leg    . COLONOSCOPY WITH PROPOFOL N/A 11/21/2015   Procedure: COLONOSCOPY WITH PROPOFOL;  Surgeon: Josefine Class, MD;  Location: [facility] ENDOSCOPY;  Service: Endoscopy;  Laterality: N/A;  . COLONOSCOPY WITH PROPOFOL N/A 08/19/2019   Procedure: COLONOSCOPY WITH PROPOFOL;  Surgeon: Toledo, Benay Pike, MD;  Location: [facility] ENDOSCOPY;  Service: Gastroenterology;  Laterality: N/A;  . ESOPHAGOGASTRODUODENOSCOPY (EGD) WITH PROPOFOL N/A 11/21/2015   Procedure: ESOPHAGOGASTRODUODENOSCOPY (EGD) WITH PROPOFOL;  Surgeon: Josefine Class, MD;  Location: [facility] ENDOSCOPY;  Service: Endoscopy;  Laterality: N/A;  . ESOPHAGOGASTRODUODENOSCOPY (EGD) WITH PROPOFOL N/A 08/19/2019   Procedure: ESOPHAGOGASTRODUODENOSCOPY (EGD) WITH PROPOFOL;  Surgeon: Toledo, Benay Pike, MD;  Location: [facility] ENDOSCOPY;  Service: Gastroenterology;  Laterality: N/A;  . TONSILLECTOMY      FAMILY HISTORY: Family History  Problem Relation Age of Onset  . Breast cancer Maternal Aunt     ADVANCED DIRECTIVES (Y/N):  N  HEALTH MAINTENANCE: Social History   Tobacco Use  . Smoking status: Former Smoker    Types: Cigarettes  . Smokeless tobacco: Never Used  Vaping Use  . Vaping Use: Never used  Substance Use Topics  . Alcohol use: Yes    Alcohol/week: 10.0 standard drinks    Types: 5 Glasses of wine, 5 Shots of liquor per week  . Drug use: Never     Colonoscopy:  PAP:  Bone density:  Lipid panel:  Allergies  Allergen Reactions  . Amlodipine   . Carvedilol   . Felodipine Swelling    Greater than 5 mg  . Lisinopril     Current  Outpatient Medications  Medication Sig Dispense Refill  . aspirin 81 MG chewable tablet Chew by mouth daily.    Marland Kitchen atorvastatin (LIPITOR) 10 MG tablet Take 10 mg by mouth daily.    Marland Kitchen buPROPion (WELLBUTRIN SR) 150 MG 12 hr tablet Take 150 mg by mouth 2 (two) times daily.    . Cyanocobalamin (VITAMIN B 12 PO) Take 1 tablet by mouth daily.     . felodipine (PLENDIL) 5 MG 24 hr tablet Take 5 mg by mouth daily.    . insulin glargine (LANTUS) 100 UNIT/ML injection Inject 50 Units into the skin as directed.     Marland Kitchen levothyroxine (SYNTHROID, LEVOTHROID) 75 MCG tablet Take 75 mcg by mouth daily before breakfast.    . metFORMIN (GLUCOPHAGE) 1000 MG tablet Take 1,000 mg by mouth 2 (two) times daily with a meal.    . metoprolol tartrate (LOPRESSOR) 25 MG tablet Take 25 mg by mouth 2 (two) times daily.    . Omega-3 Fatty Acids (FISH OIL) 1000 MG CAPS Take 1 capsule by mouth 2 (two) times daily.    Marland Kitchen omeprazole (PRILOSEC) 40 MG capsule Take 40 mg by mouth daily.    . sitaGLIPtin (JANUVIA) 100 MG tablet Take 100 mg by mouth daily.     No current facility-administered medications for this visit.    OBJECTIVE: There were no vitals filed for this visit.   There is no height or weight on file to calculate BMI.    ECOG FS:0 - Asymptomatic  General: Well-developed, well-nourished, no acute distress. Eyes: Pink conjunctiva, anicteric sclera. HEENT: Normocephalic, moist mucous membranes. Lungs: No audible wheezing or coughing. Heart: Regular rate and rhythm. Abdomen: Soft, nontender, no obvious distention. Musculoskeletal: No edema, cyanosis, or clubbing. Neuro: Alert, answering all questions appropriately. Cranial nerves grossly intact. Skin: No rashes or petechiae noted. Psych: Normal affect.   LAB RESULTS:  Lab Results  Component Value Date   NA 129 (L) 10/29/2013   K 4.3 10/29/2013   CL 97 (L) 10/29/2013   CO2 28 10/29/2013   GLUCOSE 97 10/29/2013   BUN 9 10/29/2013   CREATININE 0.79 10/29/2013   CALCIUM 9.0 10/29/2013   PROT 6.4 10/25/2013   ALBUMIN 3.3 (L) 10/25/2013   AST 75 (H) 10/25/2013   ALT 50 10/25/2013   ALKPHOS 55 10/25/2013   BILITOT 1.0 10/25/2013   GFRNONAA >60 10/29/2013   GFRAA >60 10/29/2013    Lab Results  Component Value Date   WBC 9.4 09/15/2020   NEUTROABS 5.6 09/15/2020   HGB 12.9 09/15/2020    HCT 40.3 09/15/2020   MCV 88.8 09/15/2020   PLT 298 09/15/2020   Lab Results  Component Value Date   IRON 28 09/15/2020   TIBC 428 09/15/2020   IRONPCTSAT 7 (L) 09/15/2020   Lab Results  Component Value Date   FERRITIN 6 (L) 09/15/2020     STUDIES: No results found.  ASSESSMENT: Iron deficiency anemia.  PLAN:    1. Iron deficiency anemia: Patient had colonoscopy and EGD on August 19, 2019 that did not reveal any distinct pathology.  Patient's hemoglobin continues to be within normal limits.  She has a mildly decreased ferritin level, but otherwise her iron panel is within normal limits.  No intervention is needed.  She does not require additional IV Venofer today.  She last received treatment on September 18, 2019.  Return to clinic in 4 months with repeat laboratory, further evaluation, and continuation of treatment if needed.  I spent a total  of 20 minutes reviewing chart data, face-to-face evaluation with the patient, counseling and coordination of care as detailed above.   Patient expressed understanding and was in agreement with this plan. She also understands that She can call clinic at any time with any questions, concerns, or complaints.    Lloyd Huger, MD   09/17/2020 8:02 AM

## 2020-09-20 ENCOUNTER — Inpatient Hospital Stay: Payer: Medicare Other | Admitting: Oncology

## 2020-09-20 ENCOUNTER — Inpatient Hospital Stay: Payer: Medicare Other

## 2020-09-20 DIAGNOSIS — D509 Iron deficiency anemia, unspecified: Secondary | ICD-10-CM

## 2020-12-16 ENCOUNTER — Other Ambulatory Visit: Payer: Self-pay

## 2020-12-16 DIAGNOSIS — D509 Iron deficiency anemia, unspecified: Secondary | ICD-10-CM

## 2020-12-17 NOTE — Progress Notes (Signed)
[name]  Telephone:(336) [phone] Fax:(336) [phone]  ID: Pamela Smith Pamela Smith  MR#: [id number]  XVQ#:[id number]  Patient Care Team: Tracie Harrier, MD as PCP - General (Internal Medicine)  CHIEF COMPLAINT: Iron deficiency anemia.  INTERVAL HISTORY: Patient returns to clinic today for repeat laboratory work, further evaluation, and consideration of IV Venofer.  She continues to take oral iron supplementation and is tolerating as well.  She currently feels well and is asymptomatic.  She denies any weakness or fatigue.  She has no neurologic complaints.  She denies any recent fevers or illnesses.  She has A good appetite and denies weight loss.  She has no chest pain, shortness of breath, cough, or hemoptysis.  She denies any nausea, vomiting, constipation, or diarrhea.  She has no melena or hematochezia.  She has no urinary complaints.  Patient offers no specific complaints today.  REVIEW OF SYSTEMS:   Review of Systems  Constitutional: Negative.  Negative for fever, malaise/fatigue and weight loss.  Respiratory: Negative.  Negative for cough, hemoptysis and shortness of breath.   Cardiovascular: Negative.  Negative for chest pain and leg swelling.  Gastrointestinal: Negative.  Negative for abdominal pain, blood in stool and melena.  Genitourinary: Negative.  Negative for hematuria.  Musculoskeletal: Negative.  Negative for back pain.  Skin: Negative for rash.  Neurological: Negative.  Negative for dizziness, focal weakness, weakness and headaches.  Psychiatric/Behavioral: Negative.  The patient is not nervous/anxious.     As per HPI. Otherwise, a complete review of systems is negative.  PAST MEDICAL HISTORY: Past Medical History:  Diagnosis Date   Anemia    Anxiety    Asthma without status asthmaticus    Depression    Diabetes mellitus without complication ([place])    Dyslipidemia    Hyperlipidemia    Hypertension    Hypothyroidism      PAST SURGICAL HISTORY: Past Surgical History:  Procedure Laterality Date   APPENDECTOMY     broken leg     COLONOSCOPY WITH PROPOFOL N/A 11/21/2015   Procedure: COLONOSCOPY WITH PROPOFOL;  Surgeon: Josefine Class, MD;  Location: [facility] ENDOSCOPY;  Service: Endoscopy;  Laterality: N/A;   COLONOSCOPY WITH PROPOFOL N/A 08/19/2019   Procedure: COLONOSCOPY WITH PROPOFOL;  Surgeon: Toledo, Benay Pike, MD;  Location: [facility] ENDOSCOPY;  Service: Gastroenterology;  Laterality: N/A;   ESOPHAGOGASTRODUODENOSCOPY (EGD) WITH PROPOFOL N/A 11/21/2015   Procedure: ESOPHAGOGASTRODUODENOSCOPY (EGD) WITH PROPOFOL;  Surgeon: Josefine Class, MD;  Location: [facility] ENDOSCOPY;  Service: Endoscopy;  Laterality: N/A;   ESOPHAGOGASTRODUODENOSCOPY (EGD) WITH PROPOFOL N/A 08/19/2019   Procedure: ESOPHAGOGASTRODUODENOSCOPY (EGD) WITH PROPOFOL;  Surgeon: Toledo, Benay Pike, MD;  Location: [facility] ENDOSCOPY;  Service: Gastroenterology;  Laterality: N/A;   TONSILLECTOMY      FAMILY HISTORY: Family History  Problem Relation Age of Onset   Breast cancer Maternal Aunt     ADVANCED DIRECTIVES (Y/N):  N  HEALTH MAINTENANCE: Social History   Tobacco Use   Smoking status: Former Smoker    Types: Cigarettes   Smokeless tobacco: Never Used  Scientific laboratory technician Use: Never used  Substance Use Topics   Alcohol use: Yes    Alcohol/week: 10.0 standard drinks    Types: 5 Glasses of wine, 5 Shots of liquor per week   Drug use: Never     Colonoscopy:  PAP:  Bone density:  Lipid panel:  Allergies  Allergen Reactions   Amlodipine    Carvedilol    Felodipine Swelling    Greater  than 5 mg   Lisinopril     Current Outpatient Medications  Medication Sig Dispense Refill   aspirin 81 MG chewable tablet Chew by mouth daily.     atorvastatin (LIPITOR) 10 MG tablet Take 10 mg by mouth daily.     buPROPion (WELLBUTRIN SR) 150 MG 12 hr tablet Take 150 mg by mouth 2 (two) times daily.      Cyanocobalamin (VITAMIN B 12 PO) Take 1 tablet by mouth daily.     felodipine (PLENDIL) 5 MG 24 hr tablet Take 5 mg by mouth daily.     ferrous sulfate 325 (65 FE) MG tablet Take by mouth.     glucose blood (PRECISION QID TEST) test strip Use 2 (two) times daily Use as instructed.  ONE TOUCH VERIO TEST STRIPS E11.9     insulin glargine (LANTUS) 100 UNIT/ML injection Inject 50 Units into the skin as directed.      levothyroxine (SYNTHROID, LEVOTHROID) 75 MCG tablet Take 75 mcg by mouth daily before breakfast.     metFORMIN (GLUCOPHAGE) 1000 MG tablet Take 1,000 mg by mouth 2 (two) times daily with a meal.     metoprolol tartrate (LOPRESSOR) 25 MG tablet Take 25 mg by mouth 2 (two) times daily.     Omega-3 Fatty Acids (FISH OIL) 1000 MG CAPS Take 1 capsule by mouth 2 (two) times daily.     omeprazole (PRILOSEC) 40 MG capsule Take 40 mg by mouth daily.     ONETOUCH VERIO test strip 1 each 2 (two) times daily.     sitaGLIPtin (JANUVIA) 100 MG tablet Take 100 mg by mouth daily.     No current facility-administered medications for this visit.    OBJECTIVE: Vitals:   12/20/20 1325  BP: (!) 150/74  Pulse: 85  Resp: 18  Temp: (!) 97.1 F (36.2 C)  SpO2: 99%     Body mass index is 29.59 kg/m.    ECOG FS:0 - Asymptomatic  General: Well-developed, well-nourished, no acute distress. Eyes: Pink conjunctiva, anicteric sclera. HEENT: Normocephalic, moist mucous membranes. Lungs: No audible wheezing or coughing. Heart: Regular rate and rhythm. Abdomen: Soft, nontender, no obvious distention. Musculoskeletal: No edema, cyanosis, or clubbing. Neuro: Alert, answering all questions appropriately. Cranial nerves grossly intact. Skin: No rashes or petechiae noted. Psych: Normal affect.   LAB RESULTS:  Lab Results  Component Value Date   NA 129 (L) 10/29/2013   K 4.3 10/29/2013   CL 97 (L) 10/29/2013   CO2 28 10/29/2013   GLUCOSE 97 10/29/2013   BUN 9 10/29/2013   CREATININE 0.79  10/29/2013   CALCIUM 9.0 10/29/2013   PROT 6.4 10/25/2013   ALBUMIN 3.3 (L) 10/25/2013   AST 75 (H) 10/25/2013   ALT 50 10/25/2013   ALKPHOS 55 10/25/2013   BILITOT 1.0 10/25/2013   GFRNONAA >60 10/29/2013   GFRAA >60 10/29/2013    Lab Results  Component Value Date   WBC 10.7 (H) 12/19/2020   NEUTROABS 6.5 12/19/2020   HGB 13.0 12/19/2020   HCT 41.0 12/19/2020   MCV 91.3 12/19/2020   PLT 259 12/19/2020   Lab Results  Component Value Date   IRON 86 12/19/2020   TIBC 444 12/19/2020   IRONPCTSAT 19 12/19/2020   Lab Results  Component Value Date   FERRITIN 10 (L) 12/19/2020     STUDIES: No results found.  ASSESSMENT: Iron deficiency anemia.  PLAN:    1. Iron deficiency anemia: Patient had colonoscopy and EGD on August 19, 2019  that did not reveal any distinct pathology.  Patient's hemoglobin continues to be within normal limits.  He continues to have a mildly decreased ferritin level, but this is trended up.  The remainder of her iron panel was within normal limits.  Continue oral iron supplementation.  She does not require additional IV Venofer today.  She last received treatment on September 18, 2019.  After discussion with the patient, is agreed upon that no further follow-up is necessary.  Please refer patient back if there are any questions or concerns.  I spent a total of 20 minutes reviewing chart data, face-to-face evaluation with the patient, counseling and coordination of care as detailed above.   Patient expressed understanding and was in agreement with this plan. She also understands that She can call clinic at any time with any questions, concerns, or complaints.    Lloyd Huger, MD   12/21/2020 6:20 AM

## 2020-12-19 ENCOUNTER — Inpatient Hospital Stay: Payer: Medicare Other | Attending: Oncology

## 2020-12-19 ENCOUNTER — Other Ambulatory Visit: Payer: Self-pay

## 2020-12-19 DIAGNOSIS — Z7982 Long term (current) use of aspirin: Secondary | ICD-10-CM | POA: Diagnosis not present

## 2020-12-19 DIAGNOSIS — Z87891 Personal history of nicotine dependence: Secondary | ICD-10-CM | POA: Diagnosis not present

## 2020-12-19 DIAGNOSIS — Z803 Family history of malignant neoplasm of breast: Secondary | ICD-10-CM | POA: Insufficient documentation

## 2020-12-19 DIAGNOSIS — Z79899 Other long term (current) drug therapy: Secondary | ICD-10-CM | POA: Insufficient documentation

## 2020-12-19 DIAGNOSIS — E119 Type 2 diabetes mellitus without complications: Secondary | ICD-10-CM | POA: Insufficient documentation

## 2020-12-19 DIAGNOSIS — E785 Hyperlipidemia, unspecified: Secondary | ICD-10-CM | POA: Diagnosis not present

## 2020-12-19 DIAGNOSIS — I10 Essential (primary) hypertension: Secondary | ICD-10-CM | POA: Diagnosis not present

## 2020-12-19 DIAGNOSIS — Z794 Long term (current) use of insulin: Secondary | ICD-10-CM | POA: Diagnosis not present

## 2020-12-19 DIAGNOSIS — E039 Hypothyroidism, unspecified: Secondary | ICD-10-CM | POA: Insufficient documentation

## 2020-12-19 DIAGNOSIS — D509 Iron deficiency anemia, unspecified: Secondary | ICD-10-CM | POA: Diagnosis not present

## 2020-12-19 LAB — CBC WITH DIFFERENTIAL/PLATELET
Abs Immature Granulocytes: 0.02 10*3/uL (ref 0.00–0.07)
Basophils Absolute: 0.1 10*3/uL (ref 0.0–0.1)
Basophils Relative: 1 %
Eosinophils Absolute: 0.7 10*3/uL — ABNORMAL HIGH (ref 0.0–0.5)
Eosinophils Relative: 6 %
HCT: 41 % (ref 36.0–46.0)
Hemoglobin: 13 g/dL (ref 12.0–15.0)
Immature Granulocytes: 0 %
Lymphocytes Relative: 25 %
Lymphs Abs: 2.7 10*3/uL (ref 0.7–4.0)
MCH: 29 pg (ref 26.0–34.0)
MCHC: 31.7 g/dL (ref 30.0–36.0)
MCV: 91.3 fL (ref 80.0–100.0)
Monocytes Absolute: 0.9 10*3/uL (ref 0.1–1.0)
Monocytes Relative: 8 %
Neutro Abs: 6.5 10*3/uL (ref 1.7–7.7)
Neutrophils Relative %: 60 %
Platelets: 259 10*3/uL (ref 150–400)
RBC: 4.49 MIL/uL (ref 3.87–5.11)
RDW: 14.4 % (ref 11.5–15.5)
WBC: 10.7 10*3/uL — ABNORMAL HIGH (ref 4.0–10.5)
nRBC: 0 % (ref 0.0–0.2)

## 2020-12-19 LAB — FERRITIN: Ferritin: 10 ng/mL — ABNORMAL LOW (ref 11–307)

## 2020-12-19 LAB — IRON AND TIBC
Iron: 86 ug/dL (ref 28–170)
Saturation Ratios: 19 % (ref 10.4–31.8)
TIBC: 444 ug/dL (ref 250–450)
UIBC: 358 ug/dL

## 2020-12-20 ENCOUNTER — Encounter: Payer: Self-pay | Admitting: Oncology

## 2020-12-20 ENCOUNTER — Inpatient Hospital Stay (HOSPITAL_BASED_OUTPATIENT_CLINIC_OR_DEPARTMENT_OTHER): Payer: Medicare Other | Admitting: Oncology

## 2020-12-20 ENCOUNTER — Inpatient Hospital Stay: Payer: Medicare Other

## 2020-12-20 VITALS — BP 150/74 | HR 85 | Temp 97.1°F | Resp 18 | Wt 172.4 lb

## 2020-12-20 DIAGNOSIS — D509 Iron deficiency anemia, unspecified: Secondary | ICD-10-CM

## 2021-06-21 ENCOUNTER — Other Ambulatory Visit: Payer: Self-pay | Admitting: Internal Medicine

## 2021-06-21 DIAGNOSIS — Z1231 Encounter for screening mammogram for malignant neoplasm of breast: Secondary | ICD-10-CM

## 2021-07-05 ENCOUNTER — Ambulatory Visit
Admission: RE | Admit: 2021-07-05 | Discharge: 2021-07-05 | Disposition: A | Discharge status: Home / Self Care | Payer: Medicare Other | Source: Ambulatory Visit | Attending: Internal Medicine | Admitting: Internal Medicine

## 2021-07-05 ENCOUNTER — Other Ambulatory Visit: Payer: Self-pay

## 2021-07-05 DIAGNOSIS — Z1231 Encounter for screening mammogram for malignant neoplasm of breast: Secondary | ICD-10-CM | POA: Diagnosis not present

## 2021-10-03 ENCOUNTER — Other Ambulatory Visit: Payer: Self-pay | Admitting: Gastroenterology

## 2021-10-03 DIAGNOSIS — K760 Fatty (change of) liver, not elsewhere classified: Secondary | ICD-10-CM

## 2021-10-03 DIAGNOSIS — R7401 Elevation of levels of liver transaminase levels: Secondary | ICD-10-CM

## 2021-10-25 ENCOUNTER — Ambulatory Visit
Admission: RE | Admit: 2021-10-25 | Discharge: 2021-10-25 | Disposition: A | Discharge status: Home / Self Care | Payer: Medicare Other | Source: Ambulatory Visit | Attending: Gastroenterology | Admitting: Gastroenterology

## 2021-10-25 DIAGNOSIS — R7401 Elevation of levels of liver transaminase levels: Secondary | ICD-10-CM | POA: Insufficient documentation

## 2021-10-25 DIAGNOSIS — K760 Fatty (change of) liver, not elsewhere classified: Secondary | ICD-10-CM | POA: Diagnosis not present

## 2022-06-21 ENCOUNTER — Other Ambulatory Visit: Payer: Self-pay | Admitting: Internal Medicine

## 2022-06-21 DIAGNOSIS — Z1231 Encounter for screening mammogram for malignant neoplasm of breast: Secondary | ICD-10-CM

## 2022-07-17 ENCOUNTER — Ambulatory Visit
Admission: RE | Admit: 2022-07-17 | Discharge: 2022-07-17 | Disposition: A | Discharge status: Home / Self Care | Payer: Medicare Other | Source: Ambulatory Visit | Attending: Internal Medicine | Admitting: Internal Medicine

## 2022-07-17 DIAGNOSIS — Z1231 Encounter for screening mammogram for malignant neoplasm of breast: Secondary | ICD-10-CM | POA: Diagnosis not present

## 2023-06-30 IMAGING — US US ABDOMEN LIMITED W/ ELASTOGRAPHY
1 series · 12 of 25 positions shown · non-contrast
Comparison: CT November 29, 2015

CLINICAL DATA: Nonalcoholic fatty liver disease.

EXAM:
US ABDOMEN LIMITED - RIGHT UPPER QUADRANT
ULTRASOUND HEPATIC ELASTOGRAPHY
TECHNIQUE: Sonography of the right upper quadrant was performed. In addition,
ultrasound elastography evaluation of the liver was performed. A
region of interest was placed within the right lobe of the liver.
Following application of a compressive sonographic pulse, tissue
compressibility was assessed. Multiple assessments were performed at
the selected site. Median tissue compressibility was determined.
Previously, hepatic stiffness was assessed by shear wave velocity.
Based on recently published Society of Radiologists in Ultrasound
consensus article, reporting is now recommended to be performed in
the SI units of pressure (kiloPascals) representing hepatic
stiffness/elasticity. The obtained result is compared to the
published reference standards. (cACLD = compensated Advanced Chronic
Liver Disease)

[Series 1: abdomen us · 12 of 60 slices shown]
[im 3/60]
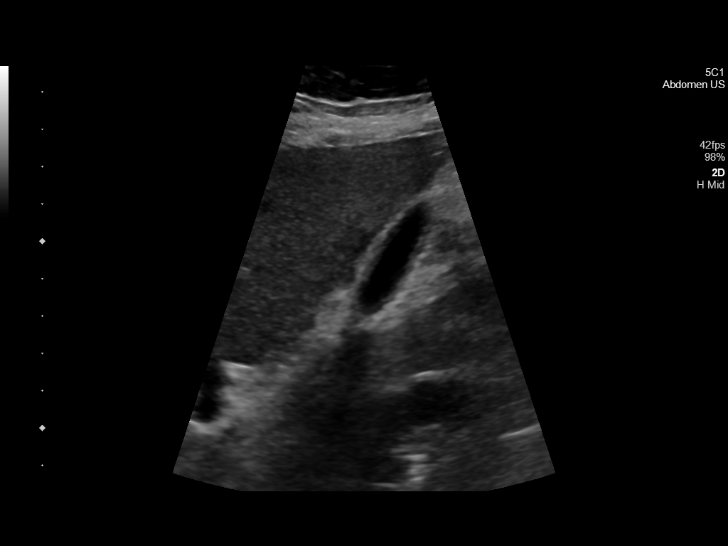
[im 8/60]
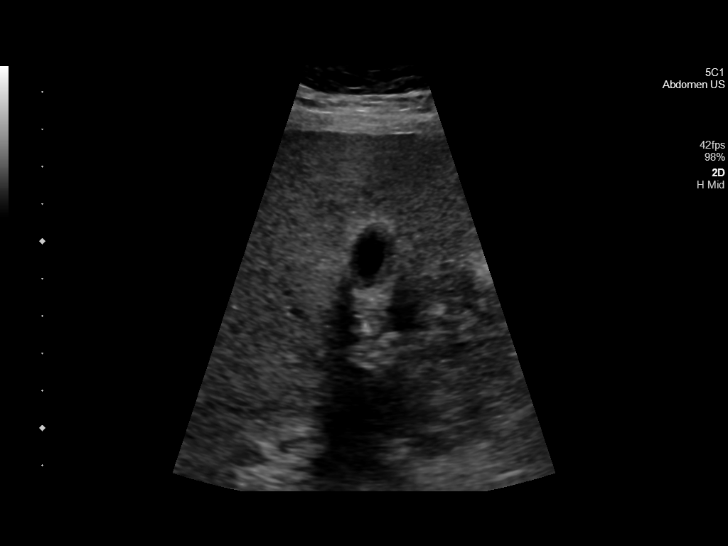
[im 13/60]
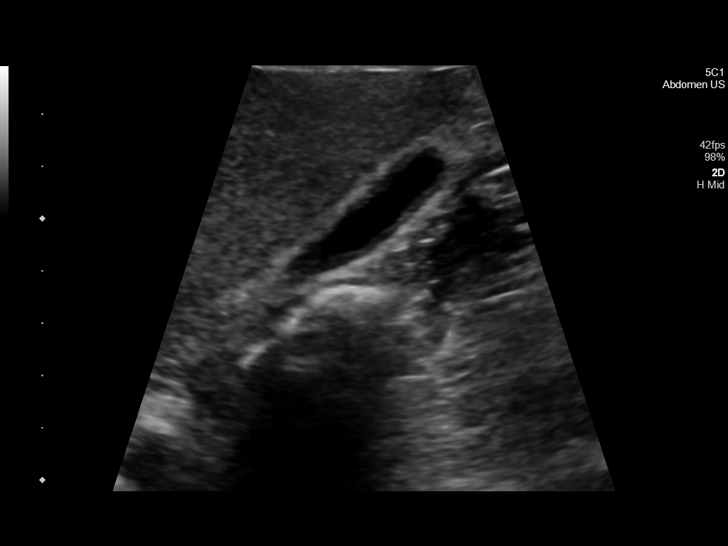
[im 18/60]
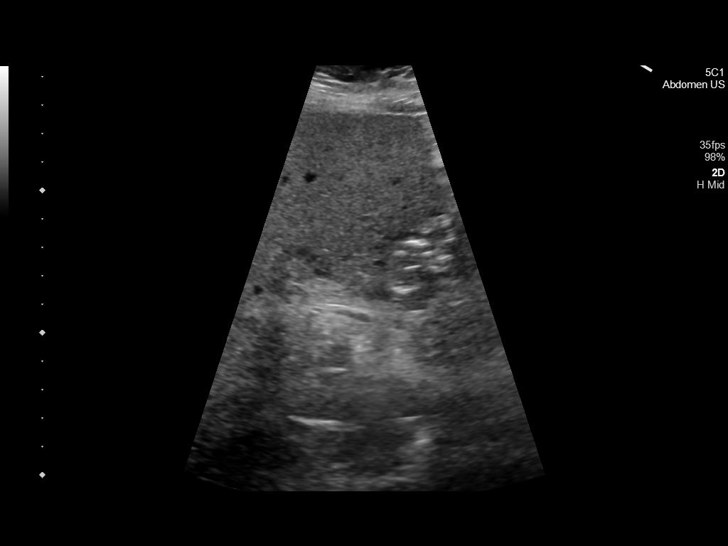
[im 23/60]
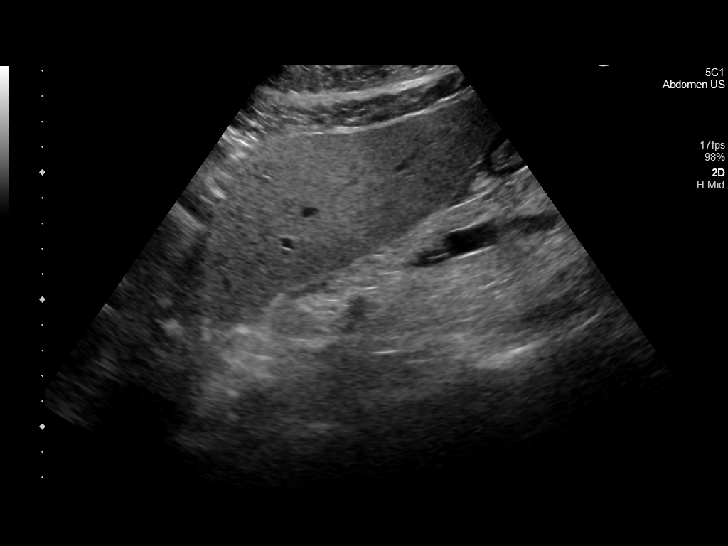
[im 28/60]
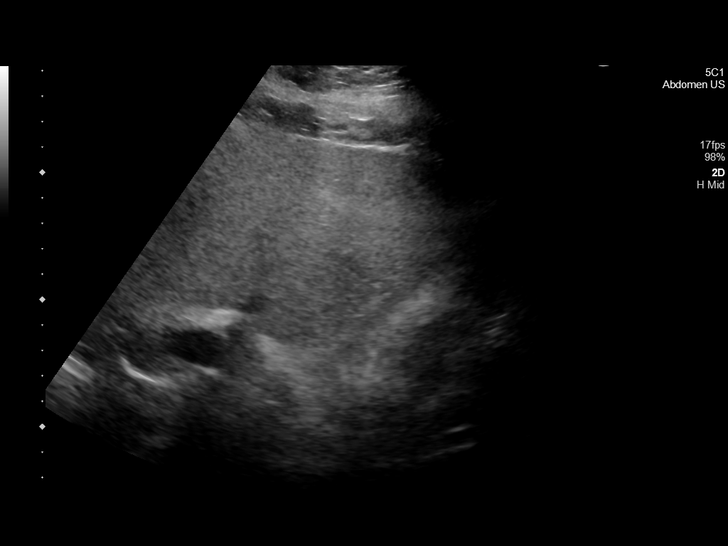
[im 32/60]
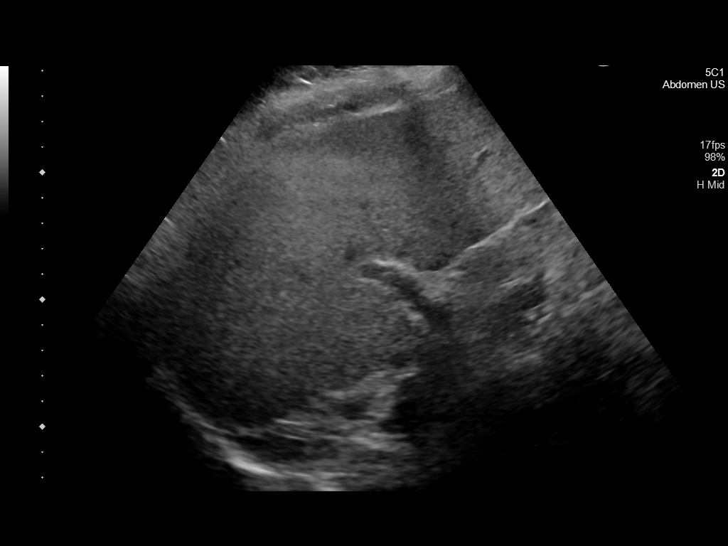
[im 37/60]
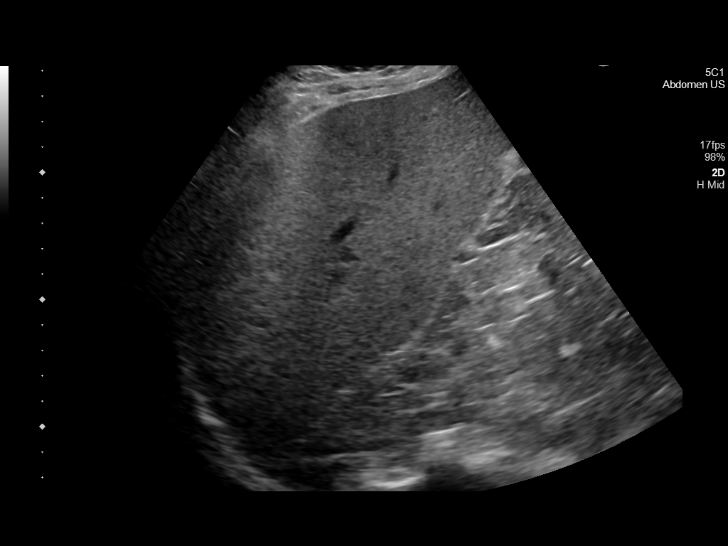
[im 42/60]
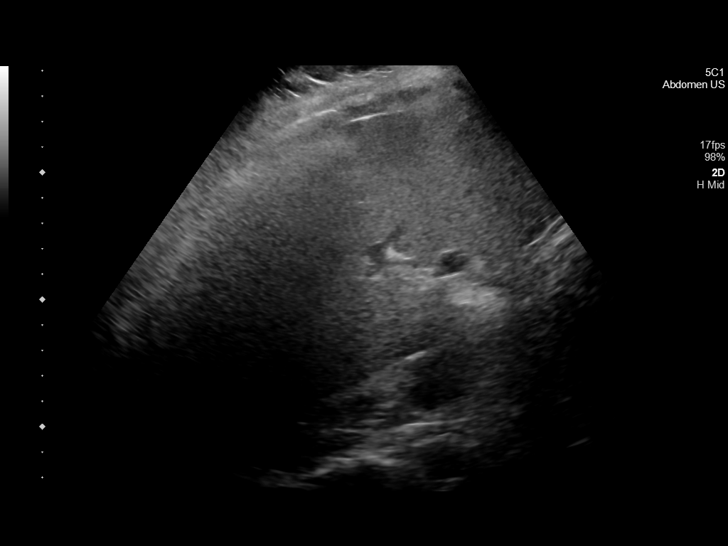
[im 47/60]
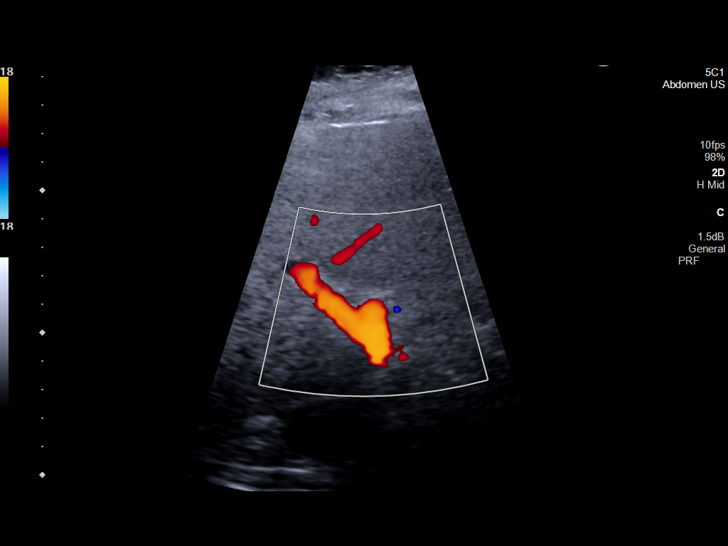
[im 52/60]
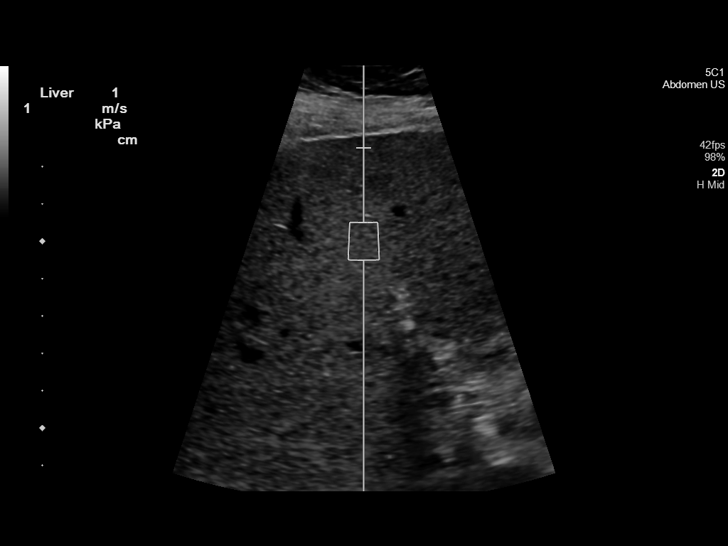
[im 57/60]
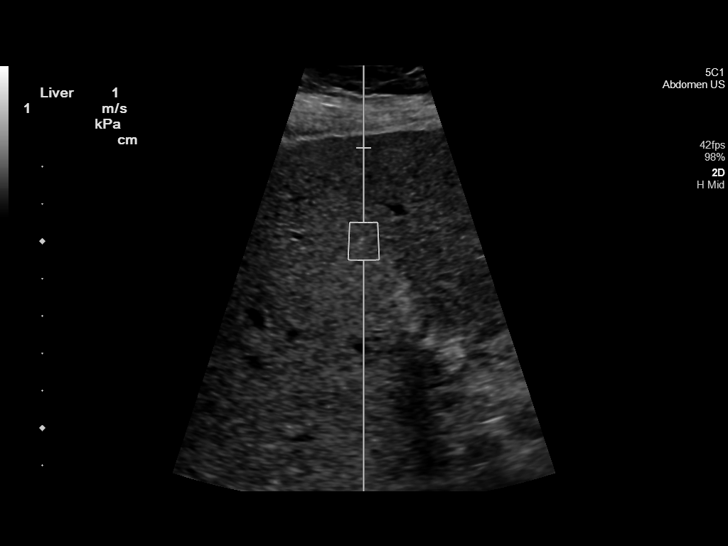

[12 of 25 positions shown; findings below may reference images not displayed]

FINDINGS: ULTRASOUND ABDOMEN LIMITED RIGHT UPPER QUADRANT

Gallbladder:

No gallstones or wall thickening visualized. No sonographic Murphy
sign noted.

Common bile duct:

Diameter: 3 mm

Liver:

No focal lesion identified. Increased hepatic parenchymal
echogenicity. Portal vein is patent on color Doppler imaging with
normal direction of blood flow towards the liver.

ULTRASOUND HEPATIC ELASTOGRAPHY

Device: Siemens Helix VTQ

Patient position: Supine

Transducer 6C1

Number of measurements: 10

Hepatic segment:  8

Median kPa:

IQR:

IQR/Median kPa ratio:

Data quality:  Good

Diagnostic category: > or =17 kPa: highly suggestive of cACLD with
an increased probability of clinically significant portal
hypertension

The use of hepatic elastography is applicable to patients with viral
hepatitis and non-alcoholic fatty liver disease. At this time, there
is insufficient data for the referenced cut-off values and use in
other causes of liver disease, including alcoholic liver disease.
Patients, however, may be assessed by elastography and serve as
their own reference standard/baseline.

In patients with non-alcoholic liver disease, the values suggesting
compensated advanced chronic liver disease (cACLD) may be lower, and
patients may need additional testing with elasticity results of [DATE]
kPa.

Please note that abnormal hepatic elasticity and shear wave
velocities may also be identified in clinical settings other than
with hepatic fibrosis, such as: acute hepatitis, elevated right
heart and central venous pressures including use of beta blockers,
Rubenstein disease ([place]), infiltrative processes such as
mastocytosis/amyloidosis/infiltrative tumor/lymphoma, extrahepatic
cholestasis, with hyperemia in the post-prandial state, and with
liver transplantation. Correlation with patient history, laboratory
data, and clinical condition recommended.

Diagnostic Categories:

< or =5 kPa: high probability of being normal

< or =9 kPa: in the absence of other known clinical signs, rules [DATE] kPa and ?13 kPa: suggestive of cACLD, but needs further testing

>13 kPa: highly suggestive of cACLD

> or =17 kPa: highly suggestive of cACLD with an increased
probability of clinically significant portal hypertension
IMPRESSION: ULTRASOUND RUQ:

Diffusely increased hepatic parenchymal echogenicity, nonspecific
but most commonly reflecting hepatic steatosis or hepatocellular
disease. No discrete hepatic lesion identified.

ULTRASOUND HEPATIC ELASTOGRAPHY:

Median kPa:

Diagnostic category: > or =17 kPa: highly suggestive of cACLD with
an increased probability of clinically significant portal
hypertension

## 2023-08-23 ENCOUNTER — Other Ambulatory Visit: Payer: Self-pay | Admitting: Internal Medicine

## 2023-08-23 DIAGNOSIS — Z1231 Encounter for screening mammogram for malignant neoplasm of breast: Secondary | ICD-10-CM

## 2023-09-23 ENCOUNTER — Emergency Department: Payer: Medicare Other

## 2023-09-23 ENCOUNTER — Encounter: Payer: Self-pay | Admitting: Emergency Medicine

## 2023-09-23 ENCOUNTER — Inpatient Hospital Stay
Admission: EM | Admit: 2023-09-23 | Discharge: 2023-10-04 | DRG: 565 | Disposition: A | Discharge status: Skilled Nursing Facility | Payer: Medicare Other | Attending: Student | Admitting: Student

## 2023-09-23 ENCOUNTER — Other Ambulatory Visit: Payer: Self-pay

## 2023-09-23 DIAGNOSIS — Z79899 Other long term (current) drug therapy: Secondary | ICD-10-CM | POA: Diagnosis not present

## 2023-09-23 DIAGNOSIS — E782 Mixed hyperlipidemia: Secondary | ICD-10-CM | POA: Diagnosis present

## 2023-09-23 DIAGNOSIS — S0012XA Contusion of left eyelid and periocular area, initial encounter: Secondary | ICD-10-CM | POA: Insufficient documentation

## 2023-09-23 DIAGNOSIS — N1831 Chronic kidney disease, stage 3a: Secondary | ICD-10-CM | POA: Diagnosis present

## 2023-09-23 DIAGNOSIS — R000 Tachycardia, unspecified: Secondary | ICD-10-CM | POA: Diagnosis not present

## 2023-09-23 DIAGNOSIS — R8281 Pyuria: Secondary | ICD-10-CM | POA: Insufficient documentation

## 2023-09-23 DIAGNOSIS — I129 Hypertensive chronic kidney disease with stage 1 through stage 4 chronic kidney disease, or unspecified chronic kidney disease: Secondary | ICD-10-CM | POA: Diagnosis present

## 2023-09-23 DIAGNOSIS — K219 Gastro-esophageal reflux disease without esophagitis: Secondary | ICD-10-CM | POA: Diagnosis present

## 2023-09-23 DIAGNOSIS — S161XXA Strain of muscle, fascia and tendon at neck level, initial encounter: Secondary | ICD-10-CM | POA: Diagnosis present

## 2023-09-23 DIAGNOSIS — M47816 Spondylosis without myelopathy or radiculopathy, lumbar region: Secondary | ICD-10-CM | POA: Diagnosis present

## 2023-09-23 DIAGNOSIS — F32A Depression, unspecified: Secondary | ICD-10-CM | POA: Diagnosis present

## 2023-09-23 DIAGNOSIS — R200 Anesthesia of skin: Secondary | ICD-10-CM | POA: Insufficient documentation

## 2023-09-23 DIAGNOSIS — Z794 Long term (current) use of insulin: Secondary | ICD-10-CM

## 2023-09-23 DIAGNOSIS — Z888 Allergy status to other drugs, medicaments and biological substances status: Secondary | ICD-10-CM

## 2023-09-23 DIAGNOSIS — W19XXXA Unspecified fall, initial encounter: Secondary | ICD-10-CM | POA: Diagnosis present

## 2023-09-23 DIAGNOSIS — E1122 Type 2 diabetes mellitus with diabetic chronic kidney disease: Secondary | ICD-10-CM | POA: Diagnosis present

## 2023-09-23 DIAGNOSIS — F39 Unspecified mood [affective] disorder: Secondary | ICD-10-CM | POA: Diagnosis present

## 2023-09-23 DIAGNOSIS — Z751 Person awaiting admission to adequate facility elsewhere: Secondary | ICD-10-CM

## 2023-09-23 DIAGNOSIS — Z7982 Long term (current) use of aspirin: Secondary | ICD-10-CM

## 2023-09-23 DIAGNOSIS — Z789 Other specified health status: Secondary | ICD-10-CM | POA: Insufficient documentation

## 2023-09-23 DIAGNOSIS — S0083XA Contusion of other part of head, initial encounter: Secondary | ICD-10-CM | POA: Diagnosis present

## 2023-09-23 DIAGNOSIS — Z7984 Long term (current) use of oral hypoglycemic drugs: Secondary | ICD-10-CM | POA: Diagnosis not present

## 2023-09-23 DIAGNOSIS — T796XXA Traumatic ischemia of muscle, initial encounter: Principal | ICD-10-CM

## 2023-09-23 DIAGNOSIS — F419 Anxiety disorder, unspecified: Secondary | ICD-10-CM | POA: Diagnosis present

## 2023-09-23 DIAGNOSIS — I10 Essential (primary) hypertension: Secondary | ICD-10-CM | POA: Diagnosis not present

## 2023-09-23 DIAGNOSIS — S0990XA Unspecified injury of head, initial encounter: Secondary | ICD-10-CM | POA: Diagnosis not present

## 2023-09-23 DIAGNOSIS — Z79890 Hormone replacement therapy (postmenopausal): Secondary | ICD-10-CM

## 2023-09-23 DIAGNOSIS — Y92009 Unspecified place in unspecified non-institutional (private) residence as the place of occurrence of the external cause: Secondary | ICD-10-CM | POA: Diagnosis not present

## 2023-09-23 DIAGNOSIS — E1165 Type 2 diabetes mellitus with hyperglycemia: Secondary | ICD-10-CM

## 2023-09-23 DIAGNOSIS — Z87891 Personal history of nicotine dependence: Secondary | ICD-10-CM

## 2023-09-23 DIAGNOSIS — N390 Urinary tract infection, site not specified: Secondary | ICD-10-CM | POA: Diagnosis present

## 2023-09-23 DIAGNOSIS — M6282 Rhabdomyolysis: Secondary | ICD-10-CM | POA: Diagnosis present

## 2023-09-23 DIAGNOSIS — F1090 Alcohol use, unspecified, uncomplicated: Secondary | ICD-10-CM | POA: Insufficient documentation

## 2023-09-23 DIAGNOSIS — E039 Hypothyroidism, unspecified: Secondary | ICD-10-CM | POA: Diagnosis present

## 2023-09-23 DIAGNOSIS — E860 Dehydration: Secondary | ICD-10-CM | POA: Diagnosis present

## 2023-09-23 DIAGNOSIS — Y900 Blood alcohol level of less than 20 mg/100 ml: Secondary | ICD-10-CM | POA: Diagnosis present

## 2023-09-23 DIAGNOSIS — N179 Acute kidney failure, unspecified: Secondary | ICD-10-CM | POA: Diagnosis not present

## 2023-09-23 LAB — COMPREHENSIVE METABOLIC PANEL
ALT: 69 U/L — ABNORMAL HIGH (ref 0–44)
AST: 69 U/L — ABNORMAL HIGH (ref 15–41)
Albumin: 4.3 g/dL (ref 3.5–5.0)
Alkaline Phosphatase: 73 U/L (ref 38–126)
Anion gap: 16 — ABNORMAL HIGH (ref 5–15)
BUN: 10 mg/dL (ref 8–23)
CO2: 22 mmol/L (ref 22–32)
Calcium: 9.6 mg/dL (ref 8.9–10.3)
Chloride: 98 mmol/L (ref 98–111)
Creatinine, Ser: 1 mg/dL (ref 0.44–1.00)
GFR, Estimated: 58 mL/min — ABNORMAL LOW (ref >60)
Glucose, Bld: 248 mg/dL — ABNORMAL HIGH (ref 70–99)
Potassium: 4.6 mmol/L (ref 3.5–5.1)
Sodium: 136 mmol/L (ref 135–145)
Total Bilirubin: 1.6 mg/dL — ABNORMAL HIGH (ref <1.2)
Total Protein: 7.5 g/dL (ref 6.5–8.1)

## 2023-09-23 LAB — CBC WITH DIFFERENTIAL/PLATELET
Abs Immature Granulocytes: 0.06 10*3/uL (ref 0.00–0.07)
Basophils Absolute: 0.1 10*3/uL (ref 0.0–0.1)
Basophils Relative: 0 %
Eosinophils Absolute: 0 10*3/uL (ref 0.0–0.5)
Eosinophils Relative: 0 %
HCT: 43.1 % (ref 36.0–46.0)
Hemoglobin: 14.4 g/dL (ref 12.0–15.0)
Immature Granulocytes: 0 %
Lymphocytes Relative: 13 %
Lymphs Abs: 1.9 10*3/uL (ref 0.7–4.0)
MCH: 29.8 pg (ref 26.0–34.0)
MCHC: 33.4 g/dL (ref 30.0–36.0)
MCV: 89 fL (ref 80.0–100.0)
Monocytes Absolute: 1.2 10*3/uL — ABNORMAL HIGH (ref 0.1–1.0)
Monocytes Relative: 8 %
Neutro Abs: 11.7 10*3/uL — ABNORMAL HIGH (ref 1.7–7.7)
Neutrophils Relative %: 79 %
Platelets: 298 10*3/uL (ref 150–400)
RBC: 4.84 MIL/uL (ref 3.87–5.11)
RDW: 12.8 % (ref 11.5–15.5)
WBC: 15 10*3/uL — ABNORMAL HIGH (ref 4.0–10.5)
nRBC: 0 % (ref 0.0–0.2)

## 2023-09-23 LAB — URINALYSIS, ROUTINE W REFLEX MICROSCOPIC
Bilirubin Urine: NEGATIVE
Glucose, UA: >=500 mg/dL — AB
Ketones, ur: 5 mg/dL — AB
Nitrite: NEGATIVE
Protein, ur: 100 mg/dL — AB
Specific Gravity, Urine: 1.009 (ref 1.005–1.030)
pH: 6 (ref 5.0–8.0)

## 2023-09-23 LAB — CBG MONITORING, ED: Glucose-Capillary: 197 mg/dL — ABNORMAL HIGH (ref 70–99)

## 2023-09-23 LAB — TROPONIN I (HIGH SENSITIVITY)
Troponin I (High Sensitivity): 12 ng/L (ref <18)
Troponin I (High Sensitivity): 14 ng/L (ref <18)

## 2023-09-23 LAB — CK: Total CK: 572 U/L — ABNORMAL HIGH (ref 38–234)

## 2023-09-23 LAB — ETHANOL: Alcohol, Ethyl (B): <10 mg/dL (ref <10)

## 2023-09-23 MED ORDER — LEVOTHYROXINE SODIUM 50 MCG PO TABS
75.0000 ug | ORAL_TABLET | Freq: Every day | ORAL | Status: DC
  Administered 2023-09-24 – 2023-10-04 (×11): 75 ug via ORAL
  Filled 2023-09-23 (×11): qty 2

## 2023-09-23 MED ORDER — SODIUM CHLORIDE 0.9 % IV SOLN
1.0000 g | INTRAVENOUS | Status: AC
  Administered 2023-09-23 – 2023-09-25 (×3): 1 g via INTRAVENOUS
  Filled 2023-09-23 (×3): qty 10

## 2023-09-23 MED ORDER — HEPARIN SODIUM (PORCINE) 5000 UNIT/ML IJ SOLN
5000.0000 [IU] | Freq: Three times a day (TID) | INTRAMUSCULAR | Status: DC
  Administered 2023-09-23 – 2023-10-04 (×33): 5000 [IU] via SUBCUTANEOUS
  Filled 2023-09-23 (×33): qty 1

## 2023-09-23 MED ORDER — METOPROLOL TARTRATE 25 MG PO TABS
25.0000 mg | ORAL_TABLET | Freq: Two times a day (BID) | ORAL | Status: DC
  Administered 2023-09-23 – 2023-10-04 (×22): 25 mg via ORAL
  Filled 2023-09-23 (×22): qty 1

## 2023-09-23 MED ORDER — LORAZEPAM 2 MG/ML IJ SOLN: 1.0000 mg | INTRAMUSCULAR | Status: DC | PRN

## 2023-09-23 MED ORDER — SODIUM CHLORIDE 0.9 % IV SOLN: INTRAVENOUS | Status: AC

## 2023-09-23 MED ORDER — INSULIN ASPART 100 UNIT/ML IJ SOLN
0.0000 [IU] | Freq: Every day | INTRAMUSCULAR | Status: DC
  Administered 2023-09-24: 2 [IU] via SUBCUTANEOUS
  Administered 2023-09-25: 3 [IU] via SUBCUTANEOUS
  Administered 2023-09-27 – 2023-09-28 (×2): 2 [IU] via SUBCUTANEOUS
  Administered 2023-09-29: 3 [IU] via SUBCUTANEOUS
  Administered 2023-09-30: 4 [IU] via SUBCUTANEOUS
  Administered 2023-10-01: 3 [IU] via SUBCUTANEOUS
  Administered 2023-10-02 – 2023-10-03 (×2): 2 [IU] via SUBCUTANEOUS
  Filled 2023-09-23 (×9): qty 1

## 2023-09-23 MED ORDER — ACETAMINOPHEN 650 MG RE SUPP
650.0000 mg | Freq: Four times a day (QID) | RECTAL | Status: DC | PRN
Start: 2023-09-23 — End: 2023-09-28

## 2023-09-23 MED ORDER — INSULIN GLARGINE-YFGN 100 UNIT/ML ~~LOC~~ SOLN
50.0000 [IU] | Freq: Every day | SUBCUTANEOUS | Status: DC
  Administered 2023-09-24 – 2023-10-01 (×8): 50 [IU] via SUBCUTANEOUS
  Filled 2023-09-23 (×8): qty 0.5

## 2023-09-23 MED ORDER — SODIUM CHLORIDE 0.9 % IV BOLUS
1000.0000 mL | Freq: Once | INTRAVENOUS | Status: AC
  Administered 2023-09-23: 1000 mL via INTRAVENOUS

## 2023-09-23 MED ORDER — HYDRALAZINE HCL 20 MG/ML IJ SOLN
5.0000 mg | Freq: Four times a day (QID) | INTRAMUSCULAR | Status: AC | PRN
  Administered 2023-09-24 – 2023-09-26 (×4): 5 mg via INTRAVENOUS
  Filled 2023-09-23 (×5): qty 1

## 2023-09-23 MED ORDER — ONDANSETRON HCL 4 MG PO TABS: 4.0000 mg | ORAL_TABLET | Freq: Four times a day (QID) | ORAL | Status: AC | PRN

## 2023-09-23 MED ORDER — METOPROLOL TARTRATE 25 MG PO TABS
25.0000 mg | ORAL_TABLET | Freq: Once | ORAL | Status: AC
  Administered 2023-09-23: 25 mg via ORAL
  Filled 2023-09-23: qty 1

## 2023-09-23 MED ORDER — ACETAMINOPHEN 325 MG PO TABS
650.0000 mg | ORAL_TABLET | Freq: Four times a day (QID) | ORAL | Status: DC | PRN
  Administered 2023-09-25: 650 mg via ORAL
  Filled 2023-09-23 (×2): qty 2

## 2023-09-23 MED ORDER — ATORVASTATIN CALCIUM 20 MG PO TABS: 10.0000 mg | ORAL_TABLET | Freq: Every day | ORAL | Status: DC

## 2023-09-23 MED ORDER — ONDANSETRON HCL 4 MG/2ML IJ SOLN: 4.0000 mg | Freq: Four times a day (QID) | INTRAMUSCULAR | Status: AC | PRN

## 2023-09-23 MED ORDER — ASPIRIN 81 MG PO CHEW
81.0000 mg | CHEWABLE_TABLET | Freq: Every day | ORAL | Status: DC
  Administered 2023-09-24 – 2023-10-04 (×11): 81 mg via ORAL
  Filled 2023-09-23 (×11): qty 1

## 2023-09-23 MED ORDER — INSULIN ASPART 100 UNIT/ML IJ SOLN
0.0000 [IU] | Freq: Three times a day (TID) | INTRAMUSCULAR | Status: DC
  Administered 2023-09-24 (×3): 5 [IU] via SUBCUTANEOUS
  Administered 2023-09-25: 8 [IU] via SUBCUTANEOUS
  Administered 2023-09-25 (×2): 3 [IU] via SUBCUTANEOUS
  Administered 2023-09-26: 5 [IU] via SUBCUTANEOUS
  Administered 2023-09-26: 2 [IU] via SUBCUTANEOUS
  Administered 2023-09-26: 11 [IU] via SUBCUTANEOUS
  Administered 2023-09-27: 3 [IU] via SUBCUTANEOUS
  Administered 2023-09-27 (×2): 2 [IU] via SUBCUTANEOUS
  Administered 2023-09-28: 5 [IU] via SUBCUTANEOUS
  Administered 2023-09-28: 8 [IU] via SUBCUTANEOUS
  Administered 2023-09-28: 3 [IU] via SUBCUTANEOUS
  Administered 2023-09-29 (×2): 5 [IU] via SUBCUTANEOUS
  Administered 2023-09-29: 2 [IU] via SUBCUTANEOUS
  Administered 2023-09-30: 8 [IU] via SUBCUTANEOUS
  Administered 2023-09-30: 2 [IU] via SUBCUTANEOUS
  Administered 2023-09-30: 3 [IU] via SUBCUTANEOUS
  Administered 2023-10-01 (×2): 5 [IU] via SUBCUTANEOUS
  Administered 2023-10-01: 15 [IU] via SUBCUTANEOUS
  Administered 2023-10-02: 5 [IU] via SUBCUTANEOUS
  Administered 2023-10-02: 8 [IU] via SUBCUTANEOUS
  Administered 2023-10-02: 5 [IU] via SUBCUTANEOUS
  Administered 2023-10-03 – 2023-10-04 (×5): 3 [IU] via SUBCUTANEOUS
  Filled 2023-09-23 (×29): qty 1

## 2023-09-23 MED ORDER — SENNOSIDES-DOCUSATE SODIUM 8.6-50 MG PO TABS: 1.0000 | ORAL_TABLET | Freq: Every evening | ORAL | Status: DC | PRN

## 2023-09-23 NOTE — H&P (Signed)
History and Physical   [name]:[id number] DOB: 1945-12-15 DOA: 09/23/2023  PCP: Barbette Reichmann, MD  Patient coming from: Home  I have personally briefly reviewed patient's old medical records in [facility] EMR.  Chief Concern: Fall  HPI: Ms. Pamela Smith is a 77 year old female with history of insulin-dependent diabetes mellitus, hyperlipidemia, hypothyroid, hypertension, GERD, who presents emergency department for chief concerns of fall.  Vitals in the ED showed temperature of 98.4, respiration rate 18, heart rate 119, blood pressure 196/102, SpO2 99% on room air.  Serum sodium is 136, potassium 4.6, chloride 98, bicarb 22, BUN of 10, serum creatinine 1.00, EGFR 58, nonfasting blood glucose 248, WBC 15, hemoglobin 14.4, platelets of 298.  CK was mildly elevated at 572.  High sensitive troponin was 12 and on repeat was 14.  UA was positive for trace leukocytes.  CT head without contrast and cervical spine without contrast: Was read as no evidence of acute abnormality intracranially or in the cervical spine.  CT lumbar spine without contrast: No acute fracture or traumatic listhesis of the lumbar spine.  Multilevel lumbar spondylosis worse at L4-L5.  ED treatment: Metoprolol 25 mg p.o. one-time dose, sodium chloride 1 L bolus. ------------------------------------------ At bedside, patient   Social history: ***  ROS:*** Constitutional: no weight change, no fever ENT/Mouth: no sore throat, no rhinorrhea Eyes: no eye pain, no vision changes Cardiovascular: no chest pain, no dyspnea,  no edema, no palpitations Respiratory: no cough, no sputum, no wheezing Gastrointestinal: no nausea, no vomiting, no diarrhea, no constipation Genitourinary: no urinary incontinence, no dysuria, no hematuria Musculoskeletal: no arthralgias, no myalgias Skin: no skin lesions, no pruritus, Neuro: + weakness, no loss of consciousness, no syncope Psych: no anxiety, no depression, +  decrease appetite Heme/Lymph: no bruising, no bleeding  ED Course: Discussed with EDP, patient requiring hospitalization for chief concerns of rhabdomyolysis.  Assessment/Plan  Principal Problem:   Rhabdomyolysis Active Problems:   Hypothyroidism   Essential (primary) hypertension   Mixed hyperlipidemia   Type 2 diabetes mellitus with hyperglycemia, with long-term current use of insulin (HCC)   Pyuria   Assessment and Plan:  * Rhabdomyolysis Mild, Status post sodium chloride 1 L bolus per EDP Continue with sodium chloride infusion at 100 mL/h Encourage p.o. intake Check CK in the a.m.  Pyuria Given patient fall, ceftriaxone 1 g IV daily, 5 doses ordered  Type 2 diabetes mellitus with hyperglycemia, with long-term current use of insulin (HCC) Home metformin, sitagliptin were not resumed on admission Insulin SSI with at bedtime coverage ordered Goal inpatient blood glucose level is 140-180  Mixed hyperlipidemia Atorvastatin 10 mg daily resumed  Essential (primary) hypertension Home metoprolol tartrate 25 mg p.o. twice daily resumed on admission Hydralazine 5 mg IV every 6 hours as needed for SBP greater than 170, 5 days ordered  Hypothyroidism Levothyroxine 75 mcg daily before breakfast resumed      *** Chart reviewed.   DVT prophylaxis: ***  Code Status: ***  Diet: *** Family Communication: ***  Disposition Plan: ***  Consults called: ***  Admission status: Telemetry medical, inpatient  Past Medical History:  Diagnosis Date   Anemia    Anxiety    Asthma without status asthmaticus    Depression    Diabetes mellitus without complication (HCC)    Dyslipidemia    Hyperlipidemia    Hypertension    Hypothyroidism    Past Surgical History:  Procedure Laterality Date   APPENDECTOMY     broken leg  COLONOSCOPY WITH PROPOFOL N/A 11/21/2015   Procedure: COLONOSCOPY WITH PROPOFOL;  Surgeon: Elnita Maxwell, MD;  Location: [facility] ENDOSCOPY;  Service:  Endoscopy;  Laterality: N/A;   COLONOSCOPY WITH PROPOFOL N/A 08/19/2019   Procedure: COLONOSCOPY WITH PROPOFOL;  Surgeon: Toledo, Boykin Nearing, MD;  Location: [facility] ENDOSCOPY;  Service: Gastroenterology;  Laterality: N/A;   ESOPHAGOGASTRODUODENOSCOPY (EGD) WITH PROPOFOL N/A 11/21/2015   Procedure: ESOPHAGOGASTRODUODENOSCOPY (EGD) WITH PROPOFOL;  Surgeon: Elnita Maxwell, MD;  Location: [facility] ENDOSCOPY;  Service: Endoscopy;  Laterality: N/A;   ESOPHAGOGASTRODUODENOSCOPY (EGD) WITH PROPOFOL N/A 08/19/2019   Procedure: ESOPHAGOGASTRODUODENOSCOPY (EGD) WITH PROPOFOL;  Surgeon: Toledo, Boykin Nearing, MD;  Location: [facility] ENDOSCOPY;  Service: Gastroenterology;  Laterality: N/A;   TONSILLECTOMY     Social History:  reports that she has quit smoking. Her smoking use included cigarettes. She has never used smokeless tobacco. She reports current alcohol use of about 10.0 standard drinks of alcohol per week. She reports that she does not use drugs.  Allergies  Allergen Reactions   Amlodipine    Carvedilol    Felodipine Swelling    Greater than 5 mg   Lisinopril    Family History  Problem Relation Age of Onset   Breast cancer Maternal Aunt    Family history: Family history reviewed and not pertinent.  Prior to Admission medications   Medication Sig Start Date End Date Taking? Authorizing Provider  aspirin 81 MG chewable tablet Chew by mouth daily.    [provider]  atorvastatin (LIPITOR) 10 MG tablet Take 10 mg by mouth daily.    [provider]  buPROPion (WELLBUTRIN SR) 150 MG 12 hr tablet Take 150 mg by mouth 2 (two) times daily.    [provider]  Cyanocobalamin (VITAMIN B 12 PO) Take 1 tablet by mouth daily.    [provider]  felodipine (PLENDIL) 5 MG 24 hr tablet Take 5 mg by mouth daily.    [provider]  ferrous sulfate 325 (65 FE) MG tablet Take by mouth.    [provider]  insulin glargine (LANTUS) 100 UNIT/ML injection Inject 50  Units into the skin as directed.     [provider]  levothyroxine (SYNTHROID, LEVOTHROID) 75 MCG tablet Take 75 mcg by mouth daily before breakfast.    [provider]  metFORMIN (GLUCOPHAGE) 1000 MG tablet Take 1,000 mg by mouth 2 (two) times daily with a meal.    [provider]  metoprolol tartrate (LOPRESSOR) 25 MG tablet Take 25 mg by mouth 2 (two) times daily.    [provider]  Omega-3 Fatty Acids (FISH OIL) 1000 MG CAPS Take 1 capsule by mouth 2 (two) times daily.    [provider]  omeprazole (PRILOSEC) 40 MG capsule Take 40 mg by mouth daily.    [provider]  [facility] VERIO test strip 1 each 2 (two) times daily. 09/17/20   [provider]  sitaGLIPtin (JANUVIA) 100 MG tablet Take 100 mg by mouth daily.    [provider]   Physical Exam: Vitals:   09/23/23 1843 09/23/23 1930 09/23/23 2000 09/23/23 2030  BP:  (!) 189/89 (!) 207/88 (!) 190/91  Pulse:  98 76 86  Resp:  18    Temp:      TempSrc:      SpO2: 99% 100% 99% 99%  Weight:      Height:       Constitutional: appears ***, NAD ***, calm Eyes: PERRL, lids and conjunctivae normal ENMT:  Mucous membranes are moist. Posterior pharynx clear of any exudate or lesions. Age-appropriate dentition. Hearing appropriate/loss*** Neck: normal, supple, no masses, no thyromegaly Respiratory: clear to auscultation bilaterally, no wheezing, no crackles. Normal respiratory effort. No accessory muscle use.  Cardiovascular: Regular rate and rhythm, no murmurs / rubs / gallops. No extremity edema. 2+ pedal pulses. No carotid bruits.  Abdomen: no tenderness, no masses palpated, no hepatosplenomegaly. Bowel sounds positive.  Musculoskeletal: no clubbing / cyanosis. No joint deformity upper and lower extremities. Good ROM, no contractures, no atrophy. Normal muscle tone.  Skin: no rashes, lesions, ulcers. No induration Neurologic: Sensation intact. Strength 5/5 in all 4.   Psychiatric: Normal judgment and insight. Alert and oriented x 3. Normal mood.   EKG: independently reviewed, showing sinus tachycardia with rate of 123, QTc 438  Chest x-ray on Admission: I personally reviewed and I agree with radiologist reading as below.  CT Lumbar Spine Wo Contrast  Result Date: 09/23/2023 CLINICAL DATA:  Back trauma, no prior imaging (Age >= 16y). EXAM: CT LUMBAR SPINE WITHOUT CONTRAST TECHNIQUE: Multidetector CT imaging of the lumbar spine was performed without intravenous contrast administration. Multiplanar CT image reconstructions were also generated. RADIATION DOSE REDUCTION: This exam was performed according to the departmental dose-optimization program which includes automated exposure control, adjustment of the mA and/or kV according to patient size and/or use of iterative reconstruction technique. COMPARISON:  CT abdomen/pelvis 11/29/2015. FINDINGS: Segmentation: Conventional numbering is assumed with 5 non-rib-bearing, lumbar type vertebral bodies. Alignment: Grade 1 anterolisthesis of L4 on L5. Vertebrae: Degenerative Schmorl's node in the inferior endplate of L4. Normal vertebral body heights. No suspicious bone lesions or evidence of acute fracture. Paraspinal and other soft tissues: Atherosclerotic calcifications of the abdominal aorta and its branches. Disc levels: Multilevel lumbar spondylosis, worst at L4-5, where anterolisthesis with uncovered disc and facet arthropathy contribute to at least mild spinal canal stenosis and severe narrowing of the left lateral recess. IMPRESSION: 1. No acute fracture or traumatic listhesis of the lumbar spine. 2. Multilevel lumbar spondylosis, worst at L4-5, where anterolisthesis with uncovered disc and facet arthropathy contribute to at least mild spinal canal stenosis and severe narrowing of the left lateral recess. Aortic Atherosclerosis (ICD10-I70.0). Electronically Signed   By: Orvan Falconer M.D.   On: 09/23/2023 18:33   DG  Chest 2 View  Result Date: 09/23/2023 CLINICAL DATA:  Weakness EXAM: CHEST - 2 VIEW COMPARISON:  None Available. FINDINGS: Heart is nonenlarged. No consolidation, pneumothorax or effusion. No edema. Frontal view is rotated to the left and is kyphotic obscuring left lung apex. Lateral view is under penetrated. IMPRESSION: Limited x-rays.  Grossly no acute cardiopulmonary disease. Electronically Signed   By: Karen Kays M.D.   On: 09/23/2023 16:15   CT Cervical Spine Wo Contrast  Result Date: 09/23/2023 CLINICAL DATA:  Neck trauma (Age >= 65y); Head trauma, minor (Age >= 65y) EXAM: CT HEAD WITHOUT CONTRAST CT CERVICAL SPINE WITHOUT CONTRAST TECHNIQUE: Multidetector CT imaging of the head and cervical spine was performed following the standard protocol without intravenous contrast. Multiplanar CT image reconstructions of the cervical spine were also generated. RADIATION DOSE REDUCTION: This exam was performed according to the departmental dose-optimization program which includes automated exposure control, adjustment of the mA and/or kV according to patient size and/or use of iterative reconstruction technique. COMPARISON:  None Available. FINDINGS: CT HEAD FINDINGS Brain: No evidence of acute infarction, hemorrhage, hydrocephalus, extra-axial collection or mass lesion/mass effect. Patchy white matter hypodensities, nonspecific but compatible with chronic microvascular ischemic  disease. Vascular: Calcific atherosclerosis. Skull: No acute fracture. Sinuses/Orbits: No acute finding. CT CERVICAL SPINE FINDINGS Alignment: Mild anterolisthesis of C4 on C5, likely degenerative given severe facet arthropathy is level. No substantial sagittal subluxation. Skull base and vertebrae: Vertebral body heights are maintained. No evidence of acute fracture. Soft tissues and spinal canal: No prevertebral fluid or swelling. No visible canal hematoma. Disc levels: Moderate to severe multilevel degenerative change. This includes  facet uncovertebral hypertrophy with varying degrees of neural foraminal stenosis. Upper chest: Visualized lung apices are clear. Other: Calcific atherosclerosis. IMPRESSION: No evidence of acute abnormality intracranially or in the cervical spine Electronically Signed   By: Feliberto Harts M.D.   On: 09/23/2023 15:51   CT Head Wo Contrast  Result Date: 09/23/2023 CLINICAL DATA:  Neck trauma (Age >= 65y); Head trauma, minor (Age >= 65y) EXAM: CT HEAD WITHOUT CONTRAST CT CERVICAL SPINE WITHOUT CONTRAST TECHNIQUE: Multidetector CT imaging of the head and cervical spine was performed following the standard protocol without intravenous contrast. Multiplanar CT image reconstructions of the cervical spine were also generated. RADIATION DOSE REDUCTION: This exam was performed according to the departmental dose-optimization program which includes automated exposure control, adjustment of the mA and/or kV according to patient size and/or use of iterative reconstruction technique. COMPARISON:  None Available. FINDINGS: CT HEAD FINDINGS Brain: No evidence of acute infarction, hemorrhage, hydrocephalus, extra-axial collection or mass lesion/mass effect. Patchy white matter hypodensities, nonspecific but compatible with chronic microvascular ischemic disease. Vascular: Calcific atherosclerosis. Skull: No acute fracture. Sinuses/Orbits: No acute finding. CT CERVICAL SPINE FINDINGS Alignment: Mild anterolisthesis of C4 on C5, likely degenerative given severe facet arthropathy is level. No substantial sagittal subluxation. Skull base and vertebrae: Vertebral body heights are maintained. No evidence of acute fracture. Soft tissues and spinal canal: No prevertebral fluid or swelling. No visible canal hematoma. Disc levels: Moderate to severe multilevel degenerative change. This includes facet uncovertebral hypertrophy with varying degrees of neural foraminal stenosis. Upper chest: Visualized lung apices are clear. Other:  Calcific atherosclerosis. IMPRESSION: No evidence of acute abnormality intracranially or in the cervical spine Electronically Signed   By: Feliberto Harts M.D.   On: 09/23/2023 15:51    Labs on Admission: I have personally reviewed following labs  CBC: Recent Labs  Lab 09/23/23 1437  WBC 15.0*  NEUTROABS 11.7*  HGB 14.4  HCT 43.1  MCV 89.0  PLT 298   Basic Metabolic Panel: Recent Labs  Lab 09/23/23 1437  NA 136  K 4.6  CL 98  CO2 22  GLUCOSE 248*  BUN 10  CREATININE 1.00  CALCIUM 9.6   GFR: Estimated Creatinine Clearance: 45.7 mL/min (by C-G formula based on SCr of 1 mg/dL).  Liver Function Tests: Recent Labs  Lab 09/23/23 1437  AST 69*  ALT 69*  ALKPHOS 73  BILITOT 1.6*  PROT 7.5  ALBUMIN 4.3   Cardiac Enzymes: Recent Labs  Lab 09/23/23 1437  CKTOTAL 572*   Urine analysis:    Component Value Date/Time   COLORURINE YELLOW (A) 09/23/2023 1644   APPEARANCEUR HAZY (A) 09/23/2023 1644   APPEARANCEUR Clear 10/24/2013 2049   LABSPEC 1.009 09/23/2023 1644   LABSPEC 1.018 10/24/2013 2049   PHURINE 6.0 09/23/2023 1644   GLUCOSEU >=500 (A) 09/23/2023 1644   GLUCOSEU 50 mg/dL 16/10/930 3557   HGBUR MODERATE (A) 09/23/2023 1644   BILIRUBINUR NEGATIVE 09/23/2023 1644   BILIRUBINUR Negative 10/24/2013 2049   KETONESUR 5 (A) 09/23/2023 1644   PROTEINUR 100 (A) 09/23/2023 1644   NITRITE  NEGATIVE 09/23/2023 1644   LEUKOCYTESUR TRACE (A) 09/23/2023 1644   LEUKOCYTESUR Negative 10/24/2013 2049   This document was prepared using Dragon Voice Recognition software and may include unintentional dictation errors.  Dr. Sedalia Muta [facility]  If 7PM-7AM, please contact overnight-coverage provider If 7AM-7PM, please contact day attending provider [email or web address]  09/23/2023, 9:34 PM

## 2023-09-23 NOTE — Assessment & Plan Note (Signed)
Given patient fall, ceftriaxone 1 g IV daily, 5 doses ordered

## 2023-09-23 NOTE — ED Triage Notes (Addendum)
Pt comes via EMS from home with c/o fall. Pt tripped last night and has been on floor since. Pt having pain in hands and neck. Pt has abrasion on forehead and around eye. Pt denies any loc. Pt not thinners.  Pt saturated in urine per EMS. 197/101 ST-125 T-98 CBG-287 pt is diabetic. 96 % RA

## 2023-09-23 NOTE — ED Notes (Signed)
Patient assisted to use bedpan.  No complaints voiced at this time

## 2023-09-23 NOTE — Assessment & Plan Note (Signed)
-  Levothyroxine 75 mcg daily before breakfast resumed 

## 2023-09-23 NOTE — ED Provider Notes (Signed)
[facility] Provider Note    Event Date/Time   First MD Initiated Contact with Patient 09/23/23 1614     (approximate)   History   Fall   HPI  Pamela Smith is a 77 y.o. female with history of diabetes, hyperlipidemia, hypothyroidism presents to the emergency department via EMS.  Patient fell last night.  She was drinking wine unsure of how much she drank.  Finally was able to call someone around 11 AM to help her up off the floor.  Has not been able to stand on her own since the fall.  Patient fell forward and hit her head.  Complaining of severe neck pain, states just feels very shaky, no vomiting  Patient states she does not drink every day.  States she had a box of wine and unsure of how much she drank of it last night.      Physical Exam   Triage Vital Signs: ED Triage Vitals  Encounter Vitals Group     BP 09/23/23 1430 (!) 143/98     Systolic BP Percentile --      Diastolic BP Percentile --      Pulse Rate 09/23/23 1430 (!) 123     Resp 09/23/23 1430 18     Temp 09/23/23 1430 98.3 F (36.8 C)     Temp Source 09/23/23 1430 Oral     SpO2 09/23/23 1430 96 %     Weight 09/23/23 1435 160 lb (72.6 kg)     Height 09/23/23 1435 5\' 3"  (1.6 m)     Head Circumference --      Peak Flow --      Pain Score 09/23/23 1435 4     Pain Loc --      Pain Education --      Exclude from Growth Chart --     Most recent vital signs: Vitals:   09/24/23 0726 09/24/23 1011  BP:  (!) 157/55  Pulse:  77  Resp:  19  Temp: 98.4 F (36.9 C)   SpO2:  99%     General: Awake, no distress.   CV:  Good peripheral perfusion. regular rate and  rhythm Resp:  Normal effort. Lungs cta Abd:  No distention.   Other:      ED Results / Procedures / Treatments   Labs (all labs ordered are listed, but only abnormal results are displayed) Labs Reviewed  COMPREHENSIVE METABOLIC PANEL - Abnormal; Notable for the following components:      Result Value   Glucose,  Bld 248 (*)    AST 69 (*)    ALT 69 (*)    Total Bilirubin 1.6 (*)    GFR, Estimated 58 (*)    Anion gap 16 (*)    All other components within normal limits  CBC WITH DIFFERENTIAL/PLATELET - Abnormal; Notable for the following components:   WBC 15.0 (*)    Neutro Abs 11.7 (*)    Monocytes Absolute 1.2 (*)    All other components within normal limits  CK - Abnormal; Notable for the following components:   Total CK 572 (*)    All other components within normal limits  URINALYSIS, ROUTINE W REFLEX MICROSCOPIC - Abnormal; Notable for the following components:   Color, Urine YELLOW (*)    APPearance HAZY (*)    Glucose, UA >=500 (*)    Hgb urine dipstick MODERATE (*)    Ketones, ur 5 (*)    Protein, ur 100 (*)  Leukocytes,Ua TRACE (*)    Bacteria, UA RARE (*)    All other components within normal limits  BASIC METABOLIC PANEL - Abnormal; Notable for the following components:   Glucose, Bld 206 (*)    GFR, Estimated 58 (*)    All other components within normal limits  CBC - Abnormal; Notable for the following components:   WBC 10.9 (*)    All other components within normal limits  HEMOGLOBIN A1C - Abnormal; Notable for the following components:   Hgb A1c MFr Bld 8.1 (*)    All other components within normal limits  CK - Abnormal; Notable for the following components:   Total CK 459 (*)    All other components within normal limits  MAGNESIUM - Abnormal; Notable for the following components:   Magnesium 1.3 (*)    All other components within normal limits  CBG MONITORING, ED - Abnormal; Notable for the following components:   Glucose-Capillary 197 (*)    All other components within normal limits  CBG MONITORING, ED - Abnormal; Notable for the following components:   Glucose-Capillary 207 (*)    All other components within normal limits  ETHANOL  PHOSPHORUS  TROPONIN I (HIGH SENSITIVITY)  TROPONIN I (HIGH SENSITIVITY)     EKG EKG    RADIOLOGY CT of the head,  C-spine, lumbar spine, chest x-ray    PROCEDURES:   Procedures   MEDICATIONS ORDERED IN ED: Medications  acetaminophen (TYLENOL) tablet 650 mg (has no administration in time range)    Or  acetaminophen (TYLENOL) suppository 650 mg (has no administration in time range)  ondansetron (ZOFRAN) tablet 4 mg (has no administration in time range)    Or  ondansetron (ZOFRAN) injection 4 mg (has no administration in time range)  heparin injection 5,000 Units (5,000 Units Subcutaneous Given 09/24/23 0710)  senna-docusate (Senokot-S) tablet 1 tablet (has no administration in time range)  0.9 %  sodium chloride infusion (0 mLs Intravenous Stopped 09/24/23 0726)  insulin aspart (novoLOG) injection 0-5 Units (0 Units Subcutaneous Not Given 09/23/23 2241)  insulin aspart (novoLOG) injection 0-15 Units (5 Units Subcutaneous Given 09/24/23 0754)  hydrALAZINE (APRESOLINE) injection 5 mg (has no administration in time range)  aspirin chewable tablet 81 mg (81 mg Oral Given 09/24/23 0936)  metoprolol tartrate (LOPRESSOR) tablet 25 mg (25 mg Oral Given 09/24/23 0936)  levothyroxine (SYNTHROID) tablet 75 mcg (75 mcg Oral Given 09/24/23 0711)  cefTRIAXone (ROCEPHIN) 1 g in sodium chloride 0.9 % 100 mL IVPB (0 g Intravenous Stopped 09/24/23 0001)  insulin glargine-yfgn (SEMGLEE) injection 50 Units (50 Units Subcutaneous Given 09/24/23 0936)  LORazepam (ATIVAN) tablet 1-4 mg (has no administration in time range)    Or  LORazepam (ATIVAN) injection 1-4 mg (has no administration in time range)  thiamine (VITAMIN B1) tablet 100 mg (100 mg Oral Given 09/24/23 0936)    Or  thiamine (VITAMIN B1) injection 100 mg ( Intravenous See Alternative 09/24/23 0936)  folic acid (FOLVITE) tablet 1 mg (1 mg Oral Given 09/24/23 1014)  multivitamin with minerals tablet 1 tablet (1 tablet Oral Given 09/24/23 0936)  sodium chloride 0.9 % bolus 1,000 mL (0 mLs Intravenous Stopped 09/23/23 1852)  metoprolol tartrate (LOPRESSOR)  tablet 25 mg (25 mg Oral Given 09/23/23 1852)     IMPRESSION / MDM / ASSESSMENT AND PLAN / ED COURSE  I reviewed the triage vital signs and the nursing notes.  Differential diagnosis includes, but is not limited to, subdural, SAH, rhabdomyolysis, fracture, contusion, dehydration, DKA  Patient's presentation is most consistent with acute presentation with potential threat to life or bodily function.   CK is elevated at 572 with elevated WBC of 15, glucose also elevated at 248, AST and ALT are elevated along with total bili's, GFR is decreased and anion gap is increased.  CT of the head, C-spine, and chest x-ray were all independently reviewed and interpreted by me by evaluating the radiologist report as being negative for any acute abnormality  Patient is tender on the lumbar spine and due to her inability to stand since the fall I feel that we should do CT lumbar spine  Ordered normal saline 1 L IV, would also like a EtOH level, and urinalysis.  Would also like second troponin   Second troponin is normal, EtOH level negative  Patient still feels weak.  Due to the mild rhabdo along with weakness along with the inability to stand on her own will call for admission.   Consult to hospitalist, care transferred to dr Lenard Lance to call hospitalist for admission, pt stable   FINAL CLINICAL IMPRESSION(S) / ED DIAGNOSES   Final diagnoses:  Traumatic rhabdomyolysis, initial encounter [facility])  Fall, initial encounter  Minor head injury, initial encounter  Strain of neck muscle, initial encounter  Sinus tachycardia     Rx / DC Orders   ED Discharge Orders     None        Note:  This document was prepared using Dragon voice recognition software and may include unintentional dictation errors.    Faythe Ghee, PA-C 09/24/23 1059    Minna Antis, MD 09/24/23 2140

## 2023-09-23 NOTE — Assessment & Plan Note (Addendum)
Mild, Status post sodium chloride 1 L bolus per EDP Continue with sodium chloride infusion at 100 mL/h Encourage p.o. intake Check CK in the a.m.

## 2023-09-23 NOTE — Assessment & Plan Note (Signed)
Home metoprolol tartrate 25 mg p.o. twice daily resumed on admission Hydralazine 5 mg IV every 6 hours as needed for SBP greater than 170, 5 days ordered

## 2023-09-23 NOTE — ED Notes (Signed)
See triage note  Presents s/p trip and fall  States she fell last night around 11 pm  Has abrasions to face  and having some neck pain

## 2023-09-23 NOTE — Assessment & Plan Note (Signed)
Home metformin, sitagliptin were not resumed on admission Insulin SSI with at bedtime coverage ordered Goal inpatient blood glucose level is 140-180

## 2023-09-23 NOTE — ED Provider Triage Note (Signed)
Emergency Medicine Provider Triage Evaluation Note  Pamela Smith , a 77 y.o. female  was evaluated in triage.  Pt complains of fall at home last night. Patient does not remember what caused her to fall. She does not think she lost consciousness. She states she laid on the floor from around 11 pm to 11 am. Ems got her up when they arrived at the house.   Review of Systems  Positive: Neck pain,  Negative:   Physical Exam  There were no vitals taken for this visit. Gen:   Awake, no distress   Resp:  Normal effort  MSK:   Moves extremities without difficulty  Other:  Abrasion to the forehead  Medical Decision Making  Medically screening exam initiated at 2:29 PM.  Appropriate orders placed.  REBEKKAH GOLA was informed that the remainder of the evaluation will be completed by another provider, this initial triage assessment does not replace that evaluation, and the importance of remaining in the ED until their evaluation is complete.     Cameron Ali, PA-C 09/23/23 1432

## 2023-09-23 NOTE — ED Triage Notes (Signed)
Patient to ED via [facility] from home after a fall. Pt states she tripped and fell around 11pm and was found around 11am after calling family with her ipad. Abrasions noted on forehead and left eye. Denies LOC. C/o neck pain.

## 2023-09-23 NOTE — Assessment & Plan Note (Addendum)
Atorvastatin not resumed on admission due to mild rhabdo Am team to resume atorvastatin 40 mg at bedtime when benefits outweigh the risk

## 2023-09-23 NOTE — Hospital Course (Signed)
Ms. Lupie Betschart is a 77 year old female with history of insulin-dependent diabetes mellitus, hyperlipidemia, hypothyroid, hypertension, GERD, who presents emergency department for chief concerns of fall.  Vitals in the ED showed temperature of 98.4, respiration rate 18, heart rate 119, blood pressure 196/102, SpO2 99% on room air.  Serum sodium is 136, potassium 4.6, chloride 98, bicarb 22, BUN of 10, serum creatinine 1.00, EGFR 58, nonfasting blood glucose 248, WBC 15, hemoglobin 14.4, platelets of 298.  CK was mildly elevated at 572.  High sensitive troponin was 12 and on repeat was 14.  UA was positive for trace leukocytes.  CT head without contrast and cervical spine without contrast: Was read as no evidence of acute abnormality intracranially or in the cervical spine.  CT lumbar spine without contrast: No acute fracture or traumatic listhesis of the lumbar spine.  Multilevel lumbar spondylosis worse at L4-L5.  ED treatment: Metoprolol 25 mg p.o. one-time dose, sodium chloride 1 L bolus.

## 2023-09-24 ENCOUNTER — Inpatient Hospital Stay: Payer: Medicare Other

## 2023-09-24 DIAGNOSIS — Z789 Other specified health status: Secondary | ICD-10-CM | POA: Insufficient documentation

## 2023-09-24 DIAGNOSIS — W19XXXA Unspecified fall, initial encounter: Secondary | ICD-10-CM | POA: Diagnosis not present

## 2023-09-24 DIAGNOSIS — R000 Tachycardia, unspecified: Secondary | ICD-10-CM

## 2023-09-24 DIAGNOSIS — T796XXA Traumatic ischemia of muscle, initial encounter: Secondary | ICD-10-CM | POA: Diagnosis not present

## 2023-09-24 DIAGNOSIS — R200 Anesthesia of skin: Secondary | ICD-10-CM | POA: Insufficient documentation

## 2023-09-24 DIAGNOSIS — S0012XA Contusion of left eyelid and periocular area, initial encounter: Secondary | ICD-10-CM | POA: Insufficient documentation

## 2023-09-24 DIAGNOSIS — S0990XA Unspecified injury of head, initial encounter: Secondary | ICD-10-CM | POA: Diagnosis not present

## 2023-09-24 DIAGNOSIS — S161XXA Strain of muscle, fascia and tendon at neck level, initial encounter: Secondary | ICD-10-CM

## 2023-09-24 LAB — CBC
HCT: 38 % (ref 36.0–46.0)
Hemoglobin: 12.8 g/dL (ref 12.0–15.0)
MCH: 30 pg (ref 26.0–34.0)
MCHC: 33.7 g/dL (ref 30.0–36.0)
MCV: 89.2 fL (ref 80.0–100.0)
Platelets: 250 10*3/uL (ref 150–400)
RBC: 4.26 MIL/uL (ref 3.87–5.11)
RDW: 12.8 % (ref 11.5–15.5)
WBC: 10.9 10*3/uL — ABNORMAL HIGH (ref 4.0–10.5)
nRBC: 0 % (ref 0.0–0.2)

## 2023-09-24 LAB — BASIC METABOLIC PANEL
Anion gap: 10 (ref 5–15)
BUN: 12 mg/dL (ref 8–23)
CO2: 24 mmol/L (ref 22–32)
Calcium: 8.9 mg/dL (ref 8.9–10.3)
Chloride: 103 mmol/L (ref 98–111)
Creatinine, Ser: 1 mg/dL (ref 0.44–1.00)
GFR, Estimated: 58 mL/min — ABNORMAL LOW (ref >60)
Glucose, Bld: 206 mg/dL — ABNORMAL HIGH (ref 70–99)
Potassium: 3.6 mmol/L (ref 3.5–5.1)
Sodium: 137 mmol/L (ref 135–145)

## 2023-09-24 LAB — PHOSPHORUS: Phosphorus: 3.1 mg/dL (ref 2.5–4.6)

## 2023-09-24 LAB — CK: Total CK: 459 U/L — ABNORMAL HIGH (ref 38–234)

## 2023-09-24 LAB — GLUCOSE, CAPILLARY
Glucose-Capillary: 203 mg/dL — ABNORMAL HIGH (ref 70–99)
Glucose-Capillary: 232 mg/dL — ABNORMAL HIGH (ref 70–99)

## 2023-09-24 LAB — CBG MONITORING, ED
Glucose-Capillary: 207 mg/dL — ABNORMAL HIGH (ref 70–99)
Glucose-Capillary: 240 mg/dL — ABNORMAL HIGH (ref 70–99)

## 2023-09-24 LAB — HEMOGLOBIN A1C
Hgb A1c MFr Bld: 8.1 % — ABNORMAL HIGH (ref 4.8–5.6)
Mean Plasma Glucose: 185.77 mg/dL

## 2023-09-24 LAB — MAGNESIUM: Magnesium: 1.3 mg/dL — ABNORMAL LOW (ref 1.7–2.4)

## 2023-09-24 LAB — FOLATE: Folate: 14.7 ng/mL (ref >5.9)

## 2023-09-24 MED ORDER — FOLIC ACID 1 MG PO TABS
1.0000 mg | ORAL_TABLET | Freq: Every day | ORAL | Status: DC
Start: 2023-09-24 — End: 2023-10-04
  Administered 2023-09-24 – 2023-10-04 (×11): 1 mg via ORAL
  Filled 2023-09-24 (×11): qty 1

## 2023-09-24 MED ORDER — LORAZEPAM 2 MG/ML IJ SOLN: 1.0000 mg | INTRAMUSCULAR | Status: DC | PRN

## 2023-09-24 MED ORDER — THIAMINE MONONITRATE 100 MG PO TABS
100.0000 mg | ORAL_TABLET | Freq: Every day | ORAL | Status: DC
  Administered 2023-09-24 – 2023-10-04 (×11): 100 mg via ORAL
  Filled 2023-09-24 (×11): qty 1

## 2023-09-24 MED ORDER — ADULT MULTIVITAMIN W/MINERALS CH
1.0000 | ORAL_TABLET | Freq: Every day | ORAL | Status: DC
  Administered 2023-09-24 – 2023-10-04 (×11): 1 via ORAL
  Filled 2023-09-24 (×11): qty 1

## 2023-09-24 MED ORDER — THIAMINE HCL 100 MG/ML IJ SOLN
100.0000 mg | Freq: Every day | INTRAMUSCULAR | Status: DC
  Filled 2023-09-24: qty 2

## 2023-09-24 MED ORDER — HYDRALAZINE HCL 50 MG PO TABS
50.0000 mg | ORAL_TABLET | Freq: Three times a day (TID) | ORAL | Status: DC
  Administered 2023-09-24 – 2023-10-02 (×23): 50 mg via ORAL
  Filled 2023-09-24 (×24): qty 1

## 2023-09-24 MED ORDER — LORAZEPAM 1 MG PO TABS
1.0000 mg | ORAL_TABLET | ORAL | Status: DC | PRN
  Filled 2023-09-24: qty 1

## 2023-09-24 NOTE — Plan of Care (Signed)

## 2023-09-24 NOTE — Assessment & Plan Note (Signed)
Patient reported that this started after the fall and that she has never had this before MRI of the brain without contrast ordered

## 2023-09-24 NOTE — Progress Notes (Signed)
Progress Note   Patient: Pamela Smith ZOX:[id number] DOB: [birth year] DOA: 09/23/2023     1 DOS: the patient was seen and examined on 09/24/2023   Brief hospital course:  Ms. Rakayla Wallenhorst is a 77 year old female with history of insulin-dependent diabetes mellitus, hyperlipidemia, hypothyroid, hypertension, GERD, who presents emergency department for chief concerns of fall.  Patient found to have mild rhabdomyolysis, urinary tract infection and periorbital ecchymosis with no fractures or intracranial pathology.   Assessment and Plan: Rhabdomyolysis -Mild  Status post sodium chloride 1 L bolus per EDP Continue with sodium chloride infusion at 100 mL/h Encourage p.o. intake Monitor CPK levels   Periorbital ecchymosis, left, initial encounter CT scan of the brain did not show any orbital fractures or acute intracranial pathology With left forehead ecchymosis, present on admission Secondary to falling at home As needed ice to the area Continue as needed pain medication   Numbness of fingers of both hands likely in the setting of alcohol use Patient reported that this started after the fall and that she has never had this before MRI of the brain without acute intracranial pathology Obtain folic acid and vitamin B12 levels   Alcohol use With possible abuse Patient states she drinks several times per week, drinking wine and/or vodka Patient not sure whether she is willing to make a decision to stop at this time Continue CIWA precaution initiated    Urinary tract infection Continue ceftriaxone 1 g IV daily, 5 doses ordered   Type 2 diabetes mellitus with hyperglycemia, with long-term current use of insulin (HCC) Home metformin, sitagliptin were not resumed on admission Insulin SSI with at bedtime coverage ordered Goal inpatient blood glucose level is 140-180   Mixed hyperlipidemia Atorvastatin not resumed on admission due to mild rhabdo Am team to resume atorvastatin 40 mg at  bedtime when benefits outweigh the risk   Essential (primary) hypertension Home metoprolol tartrate 25 mg p.o. twice daily resumed on admission Hydralazine 5 mg IV every 6 hours as needed for SBP greater than 170, 5 days ordered   Hypothyroidism Continue levothyroxine 75 mcg daily before breakfast resumed    DVT prophylaxis: Heparin 5000 units subcutaneous every 8 hours Code Status: Full code Diet: Heart healthy Family Communication: A phone call was offered, patient declined Disposition Plan: Pending clinical course Consults called: TOC Admission status: Telemetry medical, inpatient    Subjective:  Patient seen and examined at bedside this morning Denies nausea vomiting abdominal pain chest pain cough She admits to improvement compared to at the time when she came in Finger numbness improving  Physical Exam: Constitutional: appears age-appropriate, NAD, calm HEENT: Normocephalic/atraumatic Respiratory: clear to auscultation bilaterally Cardiovascular: Regular rate and rhythm, no murmurs .  Abdomen: Obese abdomen, no tenderness  Musculoskeletal: no clubbing / cyanosis. No joint deformity upper and lower extremities. Good ROM, no contractures Skin: no rashes, lesions, ulcers. No induration Neurologic: Sensation intact. Strength 5/5 in all 4.  Psychiatric: Lacks judgment and insight into health conditions. Alert and oriented x 3.  Depressed mood.  Flat affect    Vitals:   09/24/23 0726 09/24/23 1011 09/24/23 1100 09/24/23 1210  BP:  (!) 157/55 125/77   Pulse:  77 76   Resp:  19 20   Temp: 98.4 F (36.9 C)   98.9 F (37.2 C)  TempSrc: Oral   Oral  SpO2:  99% 100%   Weight:      Height:        Data Reviewed: I have reviewed patient's  imaging including MRI of the brain CT scan I have also reviewed below mentioned lab report    Latest Ref Rng & Units 09/24/2023    6:20 AM 09/23/2023    2:37 PM 10/29/2013    3:58 AM  CMP  Glucose 70 - 99 mg/dL 161  096  97   BUN 8  - 23 mg/dL 12  10  9    Creatinine 0.44 - 1.00 mg/dL 0.45  4.09  8.11   Sodium 135 - 145 mmol/L 137  136  129   Potassium 3.5 - 5.1 mmol/L 3.6  4.6  4.3   Chloride 98 - 111 mmol/L 103  98  97   CO2 22 - 32 mmol/L 24  22  28    Calcium 8.9 - 10.3 mg/dL 8.9  9.6  9.0   Total Protein 6.5 - 8.1 g/dL  7.5    Total Bilirubin <1.2 mg/dL  1.6    Alkaline Phos 38 - 126 U/L  73    AST 15 - 41 U/L  69    ALT 0 - 44 U/L  69         Latest Ref Rng & Units 09/24/2023    6:20 AM 09/23/2023    2:37 PM 12/19/2020    2:03 PM  CBC  WBC 4.0 - 10.5 K/uL 10.9  15.0  10.7   Hemoglobin 12.0 - 15.0 g/dL 91.4  78.2  95.6   Hematocrit 36.0 - 46.0 % 38.0  43.1  41.0   Platelets 150 - 400 K/uL 250  298  259      Family Communication: None present at bedside  Disposition: Status is: Inpatient   Planned Discharge Destination: Pending clinical course and PT OT eval  Time spent: 55 minutes  Author: Loyce Dys, MD 09/24/2023 3:26 PM  For on call review [email or web address].

## 2023-09-24 NOTE — Assessment & Plan Note (Addendum)
With possible abuse Patient states she drinks several times per week, drinking wine and/or vodka Patient is unclear how often she drinks per week however she does not remember the last time she has gone 3 days without drinking alcohol Extensive counseling given on alcohol cessation, patient endorses understanding and compliance CIWA precaution initiated  With new hand numbness and tingling, MRI of the brain without contrast ordered and given patient's possible alcohol abuse, will check magnesium and phosphorus level on admission  TOC consulted for abuse counseling/education

## 2023-09-24 NOTE — Assessment & Plan Note (Addendum)
With left forehead ecchymosis, present on admission Secondary to falling at home As needed ice to the area

## 2023-09-24 NOTE — ED Notes (Signed)
Assumed patient care. Received report from the previous nurse. Patient resting comfortably at this time.

## 2023-09-24 NOTE — TOC Initial Note (Addendum)
Transition of Care [facility]) - Initial/Assessment Note    Patient Details  Name: Pamela Smith MRN: [id number] Date of Birth: [birth year]  [facility] CM/SW Contact:    Marquita Palms, LCSW Phone Number: 09/24/2023, 11:02 AM  Clinical Narrative:                  CSW spoke with patient at bedside. Patient lives alone and has no relatives that live close to her anymore. Patient reports she sees her PCP "when I need too." Patient reports she uses 2 pharmacy's for prescription.  Patient reports she drinks wine. She reports that she has a few incidents where she has passed out from drinking to much. She reports she needs a life alert for medical emergency's. CSW offered patient SA resources and she stated she did not think she needed it. CSW will atttached resources to AVS.        Patient Goals and CMS Choice            Expected Discharge Plan and Services                                              Prior Living Arrangements/Services                       Activities of Daily Living      Permission Sought/Granted                  Emotional Assessment              Admission diagnosis:  Rhabdomyolysis [M62.82] Patient Active Problem List   Diagnosis Date Noted   Alcohol use 09/24/2023   Numbness of fingers of both hands 09/24/2023   Periorbital ecchymosis, left, initial encounter 09/24/2023   Rhabdomyolysis 09/23/2023   Pyuria 09/23/2023   Type 2 diabetes mellitus with hyperglycemia, with long-term current use of insulin (HCC) 08/07/2019   Osteopenia of multiple sites 07/12/2018   Iron deficiency anemia due to chronic blood loss 04/04/2018   Transaminitis 10/04/2017   Chronic anemia 09/01/2015   Mixed hyperlipidemia 03/01/2015   Vasovagal reaction 03/01/2015   Hyponatremia 11/13/2013   Hypo-osmolality and hyponatremia 11/13/2013   Obesity 06/25/2013   Major depressive disorder, single episode, unspecified 08/20/2006    Hypothyroidism 08/14/2004   Essential (primary) hypertension 08/14/2004   PCP:  Barbette Reichmann, MD Pharmacy:   [facility]) [place], [place] - [address] [facility] [address] [place] [address] [place] [address] Phone: [phone] Fax: [phone]  [facility] [address], [place] - [address] [place] [address] Phone: [phone] Fax: [phone]     Social Determinants of Health (SDOH) Social History: SDOH Screenings   Food Insecurity: No Food Insecurity (08/23/2023)   Received from [facility]  Transportation Needs: No Transportation Needs (08/23/2023)   Received from [facility]  Utilities: Not At Risk (08/23/2023)   Received from [facility]  Financial Resource Strain: Low Risk  (08/23/2023)   [facility]  Tobacco Use: Medium Risk (09/23/2023)   SDOH Interventions:     Readmission Risk Interventions    09/24/2023   11:01 AM  Readmission Risk Prevention Plan  Post Dischage Appt Complete  Medication Screening Complete  Transportation Screening Complete

## 2023-09-25 DIAGNOSIS — E1165 Type 2 diabetes mellitus with hyperglycemia: Secondary | ICD-10-CM

## 2023-09-25 DIAGNOSIS — I10 Essential (primary) hypertension: Secondary | ICD-10-CM | POA: Diagnosis not present

## 2023-09-25 DIAGNOSIS — R200 Anesthesia of skin: Secondary | ICD-10-CM

## 2023-09-25 DIAGNOSIS — T796XXA Traumatic ischemia of muscle, initial encounter: Secondary | ICD-10-CM | POA: Diagnosis not present

## 2023-09-25 DIAGNOSIS — Z794 Long term (current) use of insulin: Secondary | ICD-10-CM

## 2023-09-25 LAB — VITAMIN B12: Vitamin B-12: 224 pg/mL (ref 180–914)

## 2023-09-25 LAB — GLUCOSE, CAPILLARY
Glucose-Capillary: 191 mg/dL — ABNORMAL HIGH (ref 70–99)
Glucose-Capillary: 258 mg/dL — ABNORMAL HIGH (ref 70–99)
Glucose-Capillary: 162 mg/dL — ABNORMAL HIGH (ref 70–99)
Glucose-Capillary: 276 mg/dL — ABNORMAL HIGH (ref 70–99)

## 2023-09-25 LAB — BASIC METABOLIC PANEL
Anion gap: 9 (ref 5–15)
BUN: 13 mg/dL (ref 8–23)
CO2: 23 mmol/L (ref 22–32)
Calcium: 8.9 mg/dL (ref 8.9–10.3)
Chloride: 102 mmol/L (ref 98–111)
Creatinine, Ser: 1 mg/dL (ref 0.44–1.00)
GFR, Estimated: 58 mL/min — ABNORMAL LOW (ref >60)
Glucose, Bld: 219 mg/dL — ABNORMAL HIGH (ref 70–99)
Potassium: 3.8 mmol/L (ref 3.5–5.1)
Sodium: 134 mmol/L — ABNORMAL LOW (ref 135–145)

## 2023-09-25 LAB — CBC WITH DIFFERENTIAL/PLATELET
Abs Immature Granulocytes: 0.04 10*3/uL (ref 0.00–0.07)
Basophils Absolute: 0.1 10*3/uL (ref 0.0–0.1)
Basophils Relative: 1 %
Eosinophils Absolute: 0.2 10*3/uL (ref 0.0–0.5)
Eosinophils Relative: 2 %
HCT: 37.4 % (ref 36.0–46.0)
Hemoglobin: 12.7 g/dL (ref 12.0–15.0)
Immature Granulocytes: 0 %
Lymphocytes Relative: 32 %
Lymphs Abs: 3.4 10*3/uL (ref 0.7–4.0)
MCH: 29.7 pg (ref 26.0–34.0)
MCHC: 34 g/dL (ref 30.0–36.0)
MCV: 87.4 fL (ref 80.0–100.0)
Monocytes Absolute: 1 10*3/uL (ref 0.1–1.0)
Monocytes Relative: 10 %
Neutro Abs: 6 10*3/uL (ref 1.7–7.7)
Neutrophils Relative %: 55 %
Platelets: 239 10*3/uL (ref 150–400)
RBC: 4.28 MIL/uL (ref 3.87–5.11)
RDW: 13.1 % (ref 11.5–15.5)
WBC: 10.7 10*3/uL — ABNORMAL HIGH (ref 4.0–10.5)
nRBC: 0 % (ref 0.0–0.2)

## 2023-09-25 LAB — CK: Total CK: 317 U/L — ABNORMAL HIGH (ref 38–234)

## 2023-09-25 MED ORDER — INSULIN ASPART 100 UNIT/ML IJ SOLN
5.0000 [IU] | Freq: Three times a day (TID) | INTRAMUSCULAR | Status: DC
  Administered 2023-09-25 – 2023-09-29 (×12): 5 [IU] via SUBCUTANEOUS
  Filled 2023-09-25 (×12): qty 1

## 2023-09-25 NOTE — [place] FL2 (Signed)
[place] MEDICAID FL2 LEVEL OF CARE FORM     IDENTIFICATION  Patient Name: Pamela Smith Birthdate: 02/02/1946 Sex: female Admission Date (Current Location): 09/23/2023  [facility] and [place] Number:  Chiropodist and Address:         Provider Number: [id number]  Attending Physician Name and Address:  Pennie Banter, DO  Relative Name and Phone Number:       Current Level of Care: Hospital Recommended Level of Care: Skilled Nursing Facility [facility] Approval Number:    Date Approved/Denied:   PASRR Number: [id number]  Discharge Plan: SNF    Current Diagnoses: Patient Active Problem List   Diagnosis Date Noted   Alcohol use 09/24/2023   Numbness of fingers of both hands 09/24/2023   Periorbital ecchymosis, left, initial encounter 09/24/2023   Rhabdomyolysis 09/23/2023   Pyuria 09/23/2023   Type 2 diabetes mellitus with hyperglycemia, with long-term current use of insulin (HCC) 08/07/2019   Osteopenia of multiple sites 07/12/2018   Iron deficiency anemia due to chronic blood loss 04/04/2018   Transaminitis 10/04/2017   Chronic anemia 09/01/2015   Mixed hyperlipidemia 03/01/2015   Vasovagal reaction 03/01/2015   Hyponatremia 11/13/2013   Hypo-osmolality and hyponatremia 11/13/2013   Obesity 06/25/2013   Major depressive disorder, single episode, unspecified 08/20/2006   Hypothyroidism 08/14/2004   Essential (primary) hypertension 08/14/2004    Orientation RESPIRATION BLADDER Height & Weight     Self, Time, Situation, Place  Normal Continent Weight: 72.6 kg Height:  5\' 3"  (160 cm)  BEHAVIORAL SYMPTOMS/MOOD NEUROLOGICAL BOWEL NUTRITION STATUS      Continent Diet (Heart healthy)  AMBULATORY STATUS COMMUNICATION OF NEEDS Skin   Extensive Assist Verbally Skin abrasions, Bruising                       Personal Care Assistance Level of Assistance              Functional Limitations Info             SPECIAL CARE FACTORS FREQUENCY  PT  (By licensed PT), OT (By licensed OT)                    Contractures Contractures Info: Not present    Additional Factors Info  Code Status, Allergies Code Status Info: Full Allergies Info: Amlodipine, Carvedilol, Felodipine, Lisinopril           Current Medications (09/25/2023):  This is the current hospital active medication list Current Facility-Administered Medications  Medication Dose Route Frequency Provider Last Rate Last Admin   acetaminophen (TYLENOL) tablet 650 mg  650 mg Oral Q6H PRN Cox, Amy N, DO       Or   acetaminophen (TYLENOL) suppository 650 mg  650 mg Rectal Q6H PRN Cox, Amy N, DO       aspirin chewable tablet 81 mg  81 mg Oral Daily Cox, Amy N, DO   81 mg at 09/25/23 1121   cefTRIAXone (ROCEPHIN) 1 g in sodium chloride 0.9 % 100 mL IVPB  1 g Intravenous Q24H Esaw Grandchild A, DO 200 mL/hr at 09/24/23 2035 1 g at 09/24/23 2035   folic acid (FOLVITE) tablet 1 mg  1 mg Oral Daily Cox, Amy N, DO   1 mg at 09/25/23 1121   heparin injection 5,000 Units  5,000 Units Subcutaneous Q8H Cox, Amy N, DO   5,000 Units at 09/25/23 1403   hydrALAZINE (APRESOLINE) injection 5 mg  5 mg Intravenous  Q6H PRN Cox, Amy N, DO   5 mg at 09/25/23 1125   hydrALAZINE (APRESOLINE) tablet 50 mg  50 mg Oral Q8H Loyce Dys, MD   50 mg at 09/25/23 1403   insulin aspart (novoLOG) injection 0-15 Units  0-15 Units Subcutaneous TID WC Cox, Amy N, DO   8 Units at 09/25/23 1405   insulin aspart (novoLOG) injection 0-5 Units  0-5 Units Subcutaneous QHS Cox, Amy N, DO   2 Units at 09/24/23 2231   insulin aspart (novoLOG) injection 5 Units  5 Units Subcutaneous TID WC Esaw Grandchild A, DO   5 Units at 09/25/23 1403   insulin glargine-yfgn (SEMGLEE) injection 50 Units  50 Units Subcutaneous Daily Cox, Amy N, DO   50 Units at 09/25/23 1132   levothyroxine (SYNTHROID) tablet 75 mcg  75 mcg Oral QAC breakfast Cox, Amy N, DO   75 mcg at 09/25/23 0557   metoprolol tartrate (LOPRESSOR) tablet 25 mg   25 mg Oral BID Cox, Amy N, DO   25 mg at 09/25/23 1121   multivitamin with minerals tablet 1 tablet  1 tablet Oral Daily Cox, Amy N, DO   1 tablet at 09/25/23 1122   ondansetron (ZOFRAN) tablet 4 mg  4 mg Oral Q6H PRN Cox, Amy N, DO       Or   ondansetron (ZOFRAN) injection 4 mg  4 mg Intravenous Q6H PRN Cox, Amy N, DO       senna-docusate (Senokot-S) tablet 1 tablet  1 tablet Oral QHS PRN Cox, Amy N, DO       thiamine (VITAMIN B1) tablet 100 mg  100 mg Oral Daily Cox, Amy N, DO   100 mg at 09/25/23 1121   Or   thiamine (VITAMIN B1) injection 100 mg  100 mg Intravenous Daily Cox, Amy N, DO         Discharge Medications: Please see discharge summary for a list of discharge medications.  Relevant Imaging Results:  Relevant Lab Results:   Additional Information ss [phone]  Chapman Fitch, RN

## 2023-09-25 NOTE — TOC Initial Note (Signed)
Transition of Care [facility]) - Initial/Assessment Note    Patient Details  Name: Pamela Smith MRN: [id number] Date of Birth: 1946/08/17  Transition of Care [facility]) CM/SW Contact:    Chapman Fitch, RN Phone Number: 09/25/2023, 4:24 PM  Clinical Narrative:                  Therapy recommending SNF,  Per recommended medical plan  Existing PASRR Fl2 sent for signature Bed Search initiated  TOC to meet with patient tomorrow to complete full assessment and discuss recommendations         Patient Goals and CMS Choice            Expected Discharge Plan and Services                                              Prior Living Arrangements/Services                       Activities of Daily Living   ADL Screening (condition at time of admission) Independently performs ADLs?: No Does the patient have a NEW difficulty with bathing/dressing/toileting/self-feeding that is expected to last >3 days?: Yes (Initiates electronic notice to provider for possible OT consult) Does the patient have a NEW difficulty with getting in/out of bed, walking, or climbing stairs that is expected to last >3 days?: Yes (Initiates electronic notice to provider for possible PT consult) Does the patient have a NEW difficulty with communication that is expected to last >3 days?: No Is the patient deaf or have difficulty hearing?: No Does the patient have difficulty seeing, even when wearing glasses/contacts?: No Does the patient have difficulty concentrating, remembering, or making decisions?: No  Permission Sought/Granted                  Emotional Assessment              Admission diagnosis:  Rhabdomyolysis [M62.82] Sinus tachycardia [R00.0] Minor head injury, initial encounter [S09.90XA] Strain of neck muscle, initial encounter [S16.1XXA] Fall, initial encounter [id number]] Traumatic rhabdomyolysis, initial encounter (HCC) [T79.6XXA] Patient Active Problem List    Diagnosis Date Noted   Alcohol use 09/24/2023   Numbness of fingers of both hands 09/24/2023   Periorbital ecchymosis, left, initial encounter 09/24/2023   Rhabdomyolysis 09/23/2023   Pyuria 09/23/2023   Type 2 diabetes mellitus with hyperglycemia, with long-term current use of insulin (HCC) 08/07/2019   Osteopenia of multiple sites 07/12/2018   Iron deficiency anemia due to chronic blood loss 04/04/2018   Transaminitis 10/04/2017   Chronic anemia 09/01/2015   Mixed hyperlipidemia 03/01/2015   Vasovagal reaction 03/01/2015   Hyponatremia 11/13/2013   Hypo-osmolality and hyponatremia 11/13/2013   Obesity 06/25/2013   Major depressive disorder, single episode, unspecified 08/20/2006   Hypothyroidism 08/14/2004   Essential (primary) hypertension 08/14/2004   PCP:  Barbette Reichmann, MD Pharmacy:   [facility]) - [place], [place] - [address] [facility] [address] [place] [address] [place] [address] Phone: [phone] Fax: [phone]  [facility] [address] [place] - [address] [place] [address] Phone: [phone] Fax: [phone]     Social Determinants of Health (SDOH) Social History: SDOH Screenings   Food Insecurity: No Food Insecurity (09/24/2023)  Housing: Medium Risk (09/24/2023)  Transportation Needs: No Transportation Needs (09/24/2023)  Utilities: Not At Risk (09/24/2023)  Financial Resource Strain: Low Risk  (08/23/2023)   Received from [name]  Tobacco Use: Medium Risk (09/23/2023)   SDOH Interventions:     Readmission Risk Interventions    09/24/2023   11:01 AM  Readmission Risk Prevention Plan  Post Dischage Appt Complete  Medication Screening Complete  Transportation Screening Complete

## 2023-09-25 NOTE — Evaluation (Signed)
Occupational Therapy Evaluation Patient Details Name: EMONI DERICK MRN: [id number] DOB: May 15, 1946 Today's Date: 09/25/2023   History of Present Illness Pt is a 77 year old female presented to the ED with a fall, admitted with rhabdomyolysis, UTI, Periorbital ecchymosis,    PMH significant for insulin-dependent diabetes mellitus, hyperlipidemia, hypothyroid, hypertension, GERD,   Clinical Impression   Chart reviewed, hand off from PT with Pt in chair. Pt is alert and oriented x4, ?safety awareness deficits noted, will continue to assess. PTA pt reports she is MOD I in ADL/IADL, amb with no AD but has a hurry cane if needed. Pt presents with deficits in activity tolerance, strength, balance, affecting safe and optimal ADL completion. STS completed with MIN A two attempts, amb approx 4' forward and back with RW 2 attempts with L knee buckling requiring MOD A to correct. LB dressing completed with MIN A. Grooming/feeding tasks completed with SET UP-supervision. Pt will benefit from skilled OT to address deficits and  to facilitate optimal Adl performance. Pt is left in chair ,safety maintained, OT will follow acutely.       If plan is discharge home, recommend the following: A little help with walking and/or transfers;A little help with bathing/dressing/bathroom;Help with stairs or ramp for entrance;Assist for transportation;Assistance with cooking/housework    Functional Status Assessment  Patient has had a recent decline in their functional status and demonstrates the ability to make significant improvements in function in a reasonable and predictable amount of time.  Equipment Recommendations  BSC/3in1    Recommendations for Other Services       Precautions / Restrictions Precautions Precautions: Fall Restrictions Weight Bearing Restrictions: No      Mobility Bed Mobility               General bed mobility comments: NT in recliner pre/post session    Transfers Overall  transfer level: Needs assistance Equipment used: Rolling walker (2 wheels) Transfers: Sit to/from Stand Sit to Stand: Min assist (multiple attempts)                  Balance Overall balance assessment: Needs assistance Sitting-balance support: Feet supported Sitting balance-Leahy Scale: Good     Standing balance support: Bilateral upper extremity supported Standing balance-Leahy Scale: Fair                             ADL either performed or assessed with clinical judgement   ADL Overall ADL's : Needs assistance/impaired Eating/Feeding: Set up;Sitting   Grooming: Wash/dry face;Sitting;Supervision/safety               Lower Body Dressing: Minimal assistance;Sitting/lateral leans   Toilet Transfer: Minimal assistance;Rolling walker (2 wheels);Cueing for safety;Cueing for sequencing, siulated Toilet Transfer Details (indicate cue type and reason): simulated, L knee buckling noted Toileting- Clothing Manipulation and Hygiene: Minimal assistance Toileting - Clothing Manipulation Details (indicate cue type and reason): anticipate     Functional mobility during ADLs: Minimal assistance;Rolling walker (2 wheels) (approx 4' forward and backwards two attempts, L knee buckling requiring MOD A to correct)       Vision Patient Visual Report: No change from baseline       Perception         Praxis         Pertinent Vitals/Pain Pain Assessment Pain Assessment: Faces Faces Pain Scale: Hurts little more Pain Location: neck pain Pain Descriptors / Indicators: Discomfort Pain Intervention(s): Monitored during session, Repositioned  Extremity/Trunk Assessment Upper Extremity Assessment Upper Extremity Assessment: Generalized weakness;RUE deficits/detail;Right hand dominant;LUE deficits/detail RUE Deficits / Details: moderately weak grip strength bilaterally RUE Coordination: decreased fine motor LUE Coordination: decreased fine motor   Lower  Extremity Assessment Lower Extremity Assessment: Generalized weakness       Communication Communication Communication: No apparent difficulties Cueing Techniques: Verbal cues   Cognition Arousal: Alert Behavior During Therapy: WFL for tasks assessed/performed Overall Cognitive Status: No family/caregiver present to determine baseline cognitive functioning Area of Impairment: Problem solving, Safety/judgement                         Safety/Judgement: Decreased awareness of deficits   Problem Solving: Slow processing, Requires verbal cues, Requires tactile cues       General Comments  spo2 >95% on RA throughout, HR 98 bpm after mobility    Exercises Other Exercises Other Exercises: edu re: role of OT, role of rehab,discharge recommendation s   Shoulder Instructions      Home Living Family/patient expects to be discharged to:: Private residence Living Arrangements: Alone Available Help at Discharge: Available PRN/intermittently;Family;Friend(s) Type of Home: House Home Access: Level entry     Home Layout: One level     Bathroom Shower/Tub: Chief Strategy Officer: Standard     Home Equipment: Cane - single point          Prior Functioning/Environment Prior Level of Function : Independent/Modified Independent;Driving                        OT Problem List: Decreased strength;Decreased activity tolerance;Decreased knowledge of use of DME or AE;Decreased safety awareness;Decreased cognition;Impaired balance (sitting and/or standing)      OT Treatment/Interventions: Self-care/ADL training;Therapeutic exercise;Patient/family education;Balance training;Energy conservation;Therapeutic activities;DME and/or AE instruction    OT Goals(Current goals can be found in the care plan section) Acute Rehab OT Goals Patient Stated Goal: go home OT Goal Formulation: With patient Time For Goal Achievement: 10/09/23 Potential to Achieve Goals:  Fair ADL Goals Pt Will Perform Grooming: with modified independence;sitting;standing Pt Will Perform Lower Body Dressing: with modified independence;sit to/from stand Pt Will Transfer to Toilet: with modified independence;ambulating Pt Will Perform Toileting - Clothing Manipulation and hygiene: with modified independence;sit to/from stand  OT Frequency: Min 1X/week    Co-evaluation              AM-PAC OT "6 Clicks" Daily Activity     Outcome Measure Help from another person eating meals?: None Help from another person taking care of personal grooming?: None Help from another person toileting, which includes using toliet, bedpan, or urinal?: A Little Help from another person bathing (including washing, rinsing, drying)?: A Little Help from another person to put on and taking off regular upper body clothing?: A Little Help from another person to put on and taking off regular lower body clothing?: A Little 6 Click Score: 20   End of Session Equipment Utilized During Treatment: Gait belt;Rolling walker (2 wheels) Nurse Communication: Mobility status  Activity Tolerance: Patient tolerated treatment well Patient left: in chair;with call bell/phone within reach;with chair alarm set  OT Visit Diagnosis: Other abnormalities of gait and mobility (R26.89)                Time: 1610-9604 OT Time Calculation (min): 14 min Charges:  OT General Charges $OT Visit: 1 Visit OT Evaluation $OT Eval Low Complexity: 1 Low  Oleta Mouse, OTD OTR/L  09/25/23,  12:03 PM

## 2023-09-25 NOTE — Progress Notes (Signed)
Progress Note   Patient: Pamela Smith QIO:[id number] DOB: 22-Aug-1946 DOA: 09/23/2023     2 DOS: the patient was seen and examined on 09/25/2023   Brief hospital course: "Ms. Jovonne Wohlwend is a 77 year old female with history of insulin-dependent diabetes mellitus, hyperlipidemia, hypothyroid, hypertension, GERD, who presents emergency department for chief concerns of fall.  Patient found to have mild rhabdomyolysis, urinary tract infection and periorbital ecchymosis with no fractures or intracranial pathology."  See H&P for full HPI on admission & ED course.   Further hospital course and management as outlined below.    Assessment and Plan: Rhabdomyolysis -Mild  Status post sodium chloride 1 L bolus per EDP, followed by NS at 100 mL/h Encourage p.o. hydration CK improving 459 >> 317 today Trend CK level daily Holding Lipitor  Periorbital ecchymosis, left, initial encounter CT scan of the brain did not show any orbital fractures or acute intracranial pathology With left forehead ecchymosis, present on admission Secondary to falling at home As needed ice to the area Continue as needed pain medication    Numbness of fingers of both hands likely in the setting of alcohol use vs carpal tunnel syndrome Patient reported that this started after the fall and that she has never had this before MRI of the brain without acute intracranial pathology CT C-spine was negative for acute injuries  Normal levels of folic acid and vitamin B12     Alcohol use Patient states she drinks several times per week, drinking wine and/or vodka Monitored on CIWA and scores have been zero. Will d/c CIWA protocol and Ativan     Urinary tract infection Continue ceftriaxone 1 g IV daily, 5 doses ordered Follow urine culture   Type 2 diabetes mellitus with hyperglycemia, with long-term current use of insulin (HCC) Home metformin, sitagliptin were not resumed on admission Sliding scale Novolog Goal  inpatient blood glucose level is 140-180   Mixed hyperlipidemia Atorvastatin held due to rhabdo Resume once CK has normalized   Essential (primary) hypertension Home metoprolol tartrate 25 mg p.o. twice daily  Hydralazine 5 mg IV PRN   Hypothyroidism Continue levothyroxine 75 mcg daily before breakfast     DVT prophylaxis: Heparin 5000 units subcutaneous every 8 hours  Code Status: Full code  Diet: Heart healthy  Family Communication: A phone call was offered, patient declined  Disposition Plan: SNF  Consults called: TOC     Subjective:  Patient up in recliner when seen this AM.  She reports being quite weak.  Reports tingling in her fingers and weak feeling grip.  Does have some neck soreness.  Physical Exam: General exam: awake, alert, no acute distress HEENT: left anterior forehead abrasion with no surrounding erythema or swelling, moist mucus membranes, hearing grossly normal  Respiratory system: CTAB, no wheezes, rales or rhonchi, normal respiratory effort. Cardiovascular system: normal S1/S2, RRR, no JVD, murmurs, rubs, gallops, no pedal edema.   Gastrointestinal system: soft, NT, ND, no HSM felt, +bowel sounds. Central nervous system: A&O x 3. no gross focal neurologic deficits, normal speech Extremities: moves all, no edema, normal tone Skin: dry, intact, left dorsal hand is mildly erythematous uniformly and warm  Psychiatry: normal mood, congruent affect     Vitals:   09/24/23 2338 09/25/23 0225 09/25/23 0743 09/25/23 1125  BP: (!) 142/68 (!) 163/82 (!) 183/77 (!) 184/78  Pulse: 91 91 93   Resp:  18 16   Temp:  99 F (37.2 C) 98 F (36.7 C)   TempSrc:  Oral  Oral   SpO2:  96% 95%   Weight:      Height:        Data Reviewed: I have reviewed patient's imaging including MRI of the brain CT scan I have also reviewed below mentioned lab report    Latest Ref Rng & Units 09/25/2023    3:15 AM 09/24/2023    6:20 AM 09/23/2023    2:37 PM  CMP  Glucose  70 - 99 mg/dL 161  096  045   BUN 8 - 23 mg/dL 13  12  10    Creatinine 0.44 - 1.00 mg/dL 4.09  8.11  9.14   Sodium 135 - 145 mmol/L 134  137  136   Potassium 3.5 - 5.1 mmol/L 3.8  3.6  4.6   Chloride 98 - 111 mmol/L 102  103  98   CO2 22 - 32 mmol/L 23  24  22    Calcium 8.9 - 10.3 mg/dL 8.9  8.9  9.6   Total Protein 6.5 - 8.1 g/dL   7.5   Total Bilirubin <1.2 mg/dL   1.6   Alkaline Phos 38 - 126 U/L   73   AST 15 - 41 U/L   69   ALT 0 - 44 U/L   69        Latest Ref Rng & Units 09/25/2023    3:15 AM 09/24/2023    6:20 AM 09/23/2023    2:37 PM  CBC  WBC 4.0 - 10.5 K/uL 10.7  10.9  15.0   Hemoglobin 12.0 - 15.0 g/dL 78.2  95.6  21.3   Hematocrit 36.0 - 46.0 % 37.4  38.0  43.1   Platelets 150 - 400 K/uL 239  250  298      Family Communication: None present at bedside  Disposition: Status is: Inpatient   Planned Discharge Destination: Needs SNF placement for rehab  Time spent: 42 minutes  Author: Pennie Banter, DO 09/25/2023 2:09 PM  For on call review [email or web address].

## 2023-09-25 NOTE — Evaluation (Signed)
Physical Therapy Evaluation Patient Details Name: Pamela Smith MRN: [id number] DOB: [birth year] Today's Date: 09/25/2023  History of Present Illness  Pt is a 77 year old female with history of insulin-dependent diabetes mellitus, hyperlipidemia, hypothyroid, hypertension, GERD, who presents emergency department for chief concerns of fall.  Work up showed mild rhabdomyolysis, urinary tract infection and periorbital ecchymosis.   Clinical Impression  Pt alert, oriented to self, place, situation. Reported neck pain with all mobility as well as generalized stiffness/soreness. The pt stated at baseline she is independent, family lives several hours away, drives, goes to the grocery store, etc. The pt required CGA and max encouragement complete as independent as possible to mobilize to EOB. Sit <> stand with CGA and RW, but needed several tries and again encouragement to complete without assistance if able. She was able to step pivot to recliner with CGA and RW but reported fatigue, neck pain, and fear of legs giving out, further mobility deferred.  Overall the patient demonstrated deficits (see "PT Problem List") that impede the patient's functional abilities, safety, and mobility and would benefit from skilled PT intervention.          If plan is discharge home, recommend the following: A lot of help with walking and/or transfers;A lot of help with bathing/dressing/bathroom;Assist for transportation;Assistance with cooking/housework;Help with stairs or ramp for entrance   Can travel by private vehicle   Yes    Equipment Recommendations Other (comment) (TBD)  Recommendations for Other Services       Functional Status Assessment Patient has had a recent decline in their functional status and demonstrates the ability to make significant improvements in function in a reasonable and predictable amount of time.     Precautions / Restrictions Precautions Precautions: Fall Restrictions Weight  Bearing Restrictions: No      Mobility  Bed Mobility Overal bed mobility: Needs Assistance Bed Mobility: Supine to Sit     Supine to sit: Contact guard, Used rails, HOB elevated          Transfers Overall transfer level: Needs assistance Equipment used: Rolling walker (2 wheels) Transfers: Sit to/from Stand, Bed to chair/wheelchair/BSC Sit to Stand: Contact guard assist   Step pivot transfers: Contact guard assist       General transfer comment: CGA with encouragement extra and encouragement    Ambulation/Gait               General Gait Details: deferred due to pt pain, pt declining further mobility  Stairs            Wheelchair Mobility     Tilt Bed    Modified Rankin (Stroke Patients Only)       Balance Overall balance assessment: Needs assistance Sitting-balance support: Feet supported Sitting balance-Leahy Scale: Good     Standing balance support: Bilateral upper extremity supported Standing balance-Leahy Scale: Fair                               Pertinent Vitals/Pain Pain Assessment Pain Assessment: Faces Faces Pain Scale: Hurts little more Pain Location: neck pain Pain Descriptors / Indicators: Discomfort Pain Intervention(s): Monitored during session, Repositioned    Home Living Family/patient expects to be discharged to:: Private residence Living Arrangements: Alone Available Help at Discharge: Available PRN/intermittently;Family;Friend(s) Type of Home: House Home Access: Level entry       Home Layout: One level Home Equipment: Gilmer Mor - single point      Prior Function Prior  Level of Function : Independent/Modified Independent;Driving                     Extremity/Trunk Assessment   Upper Extremity Assessment Upper Extremity Assessment: Defer to OT evaluation RUE Deficits / Details: moderately weak grip strength bilaterally RUE Coordination: decreased fine motor LUE Coordination: decreased fine  motor    Lower Extremity Assessment Lower Extremity Assessment: Generalized weakness (able to lift BLE against gravity)       Communication   Communication Communication: No apparent difficulties Cueing Techniques: Verbal cues  Cognition Arousal: Alert Behavior During Therapy: WFL for tasks assessed/performed Overall Cognitive Status: Within Functional Limits for tasks assessed Area of Impairment: Problem solving, Safety/judgement                             Problem Solving: Slow processing          General Comments General comments (skin integrity, edema, etc.): spo2 >95% on RA throughout, HR 98 bpm after mobility    Exercises     Assessment/Plan    PT Assessment Patient needs continued PT services  PT Problem List Decreased strength;Decreased range of motion;Decreased activity tolerance;Decreased safety awareness;Decreased balance;Decreased mobility;Decreased knowledge of precautions       PT Treatment Interventions Neuromuscular re-education;DME instruction;Gait training;Stair training;Patient/family education;Functional mobility training;Therapeutic activities;Therapeutic exercise;Balance training    PT Goals (Current goals can be found in the Care Plan section)  Acute Rehab PT Goals Patient Stated Goal: to feel better PT Goal Formulation: With patient Time For Goal Achievement: 10/09/23 Potential to Achieve Goals: Good    Frequency Min 1X/week     Co-evaluation               AM-PAC PT "6 Clicks" Mobility  Outcome Measure Help needed turning from your back to your side while in a flat bed without using bedrails?: A Little Help needed moving from lying on your back to sitting on the side of a flat bed without using bedrails?: A Little Help needed moving to and from a bed to a chair (including a wheelchair)?: A Little Help needed standing up from a chair using your arms (e.g., wheelchair or bedside chair)?: A Little Help needed to walk in  hospital room?: A Little Help needed climbing 3-5 steps with a railing? : A Lot 6 Click Score: 17    End of Session Equipment Utilized During Treatment: Gait belt Activity Tolerance: Patient tolerated treatment well Patient left: in chair;with call bell/phone within reach;Other (comment) (with OT at bedside)   PT Visit Diagnosis: Other abnormalities of gait and mobility (R26.89);Muscle weakness (generalized) (M62.81);Difficulty in walking, not elsewhere classified (R26.2)    Time: 5366-4403 PT Time Calculation (min) (ACUTE ONLY): 13 min   Charges:   PT Evaluation $PT Eval Low Complexity: 1 Low   PT General Charges $$ ACUTE PT VISIT: 1 Visit       Olga Coaster PT, DPT 1:59 PM,09/25/23

## 2023-09-25 NOTE — Progress Notes (Signed)
Mobility Specialist - Progress Note     09/25/23 1549  Mobility  Activity Turned to right side;Turned to left side;Turned to back - supine;Dangled on edge of bed  Range of Motion/Exercises Active;Right leg;Left leg  Activity Response Tolerated well  Mobility Referral Yes  Mobility visit 1 Mobility   Pt resting in bed upon entry on RA. Pt declined further mobility but agreed to move to EOB and perform exercises in bed. Pt performed 6x1 knee to chest LLE and LRE, 6x1 ankle rotations LLE and LRE, and 5x1 heel press downs LLE and LRE. Pt left in bed with needs in reach and bed alarm activated   Pamela Smith Mobility Specialist 09/25/23, 3:55 PM

## 2023-09-26 DIAGNOSIS — E039 Hypothyroidism, unspecified: Secondary | ICD-10-CM | POA: Diagnosis not present

## 2023-09-26 DIAGNOSIS — E1165 Type 2 diabetes mellitus with hyperglycemia: Secondary | ICD-10-CM | POA: Diagnosis not present

## 2023-09-26 DIAGNOSIS — Z794 Long term (current) use of insulin: Secondary | ICD-10-CM | POA: Diagnosis not present

## 2023-09-26 DIAGNOSIS — I10 Essential (primary) hypertension: Secondary | ICD-10-CM | POA: Diagnosis not present

## 2023-09-26 LAB — CBC
HCT: 40.3 % (ref 36.0–46.0)
Hemoglobin: 13.4 g/dL (ref 12.0–15.0)
MCH: 29.2 pg (ref 26.0–34.0)
MCHC: 33.3 g/dL (ref 30.0–36.0)
MCV: 87.8 fL (ref 80.0–100.0)
Platelets: 289 10*3/uL (ref 150–400)
RBC: 4.59 MIL/uL (ref 3.87–5.11)
RDW: 12.9 % (ref 11.5–15.5)
WBC: 11.5 10*3/uL — ABNORMAL HIGH (ref 4.0–10.5)
nRBC: 0 % (ref 0.0–0.2)

## 2023-09-26 LAB — GLUCOSE, CAPILLARY
Glucose-Capillary: 227 mg/dL — ABNORMAL HIGH (ref 70–99)
Glucose-Capillary: 305 mg/dL — ABNORMAL HIGH (ref 70–99)
Glucose-Capillary: 137 mg/dL — ABNORMAL HIGH (ref 70–99)
Glucose-Capillary: 167 mg/dL — ABNORMAL HIGH (ref 70–99)

## 2023-09-26 LAB — CK: Total CK: 219 U/L (ref 38–234)

## 2023-09-26 MED ORDER — BUPROPION HCL ER (SR) 150 MG PO TB12
150.0000 mg | ORAL_TABLET | Freq: Two times a day (BID) | ORAL | Status: DC
  Administered 2023-09-26 – 2023-10-04 (×17): 150 mg via ORAL
  Filled 2023-09-26 (×19): qty 1

## 2023-09-26 MED ORDER — ATORVASTATIN CALCIUM 20 MG PO TABS
40.0000 mg | ORAL_TABLET | Freq: Every day | ORAL | Status: DC
  Administered 2023-09-27 – 2023-10-04 (×8): 40 mg via ORAL
  Filled 2023-09-26 (×8): qty 2

## 2023-09-26 MED ORDER — ALBUTEROL SULFATE (2.5 MG/3ML) 0.083% IN NEBU: 2.5000 mg | INHALATION_SOLUTION | Freq: Four times a day (QID) | RESPIRATORY_TRACT | Status: DC | PRN

## 2023-09-26 MED ORDER — FELODIPINE ER 5 MG PO TB24
5.0000 mg | ORAL_TABLET | Freq: Every day | ORAL | Status: DC
  Administered 2023-09-26 – 2023-10-04 (×9): 5 mg via ORAL
  Filled 2023-09-26 (×9): qty 1

## 2023-09-26 MED ORDER — ACETAMINOPHEN 325 MG PO TABS
650.0000 mg | ORAL_TABLET | Freq: Four times a day (QID) | ORAL | Status: AC | PRN
  Administered 2023-09-28: 650 mg via ORAL
  Filled 2023-09-26: qty 2

## 2023-09-26 MED ORDER — PANTOPRAZOLE SODIUM 40 MG PO TBEC
40.0000 mg | DELAYED_RELEASE_TABLET | Freq: Every day | ORAL | Status: DC
  Administered 2023-09-26 – 2023-10-04 (×9): 40 mg via ORAL
  Filled 2023-09-26 (×9): qty 1

## 2023-09-26 MED ORDER — ACETAMINOPHEN 650 MG RE SUPP: 650.0000 mg | Freq: Four times a day (QID) | RECTAL | Status: AC | PRN

## 2023-09-26 MED ORDER — CALCIUM WITH D3 250-12.5 MG-MCG PO CHEW: CHEWABLE_TABLET | Freq: Two times a day (BID) | ORAL | Status: DC

## 2023-09-26 MED ORDER — VITAMIN D 25 MCG (1000 UNIT) PO TABS
1000.0000 [IU] | ORAL_TABLET | Freq: Every day | ORAL | Status: DC
Start: 2023-09-26 — End: 2023-10-04
  Administered 2023-09-26 – 2023-10-04 (×9): 1000 [IU] via ORAL
  Filled 2023-09-26 (×9): qty 1

## 2023-09-26 MED ORDER — LINAGLIPTIN 5 MG PO TABS
5.0000 mg | ORAL_TABLET | Freq: Every day | ORAL | Status: DC
  Administered 2023-09-26 – 2023-10-04 (×9): 5 mg via ORAL
  Filled 2023-09-26 (×9): qty 1

## 2023-09-26 MED ORDER — NAPROXEN 500 MG PO TABS
500.0000 mg | ORAL_TABLET | Freq: Two times a day (BID) | ORAL | Status: DC | PRN
  Administered 2023-09-28 – 2023-10-02 (×5): 500 mg via ORAL
  Filled 2023-09-26 (×7): qty 1

## 2023-09-26 MED ORDER — CALCIUM CARBONATE 1250 (500 CA) MG PO TABS
1.0000 | ORAL_TABLET | Freq: Every day | ORAL | Status: DC
  Administered 2023-09-27 – 2023-10-04 (×8): 1250 mg via ORAL
  Filled 2023-09-26 (×8): qty 1

## 2023-09-26 NOTE — Progress Notes (Addendum)
Progress Note   Patient: Pamela Smith [id number]:[id number] DOB: Feb 27, 1946 DOA: 09/23/2023     3 DOS: the patient was seen and examined on 09/26/2023   Brief hospital course: "Ms. Pamela Smith is a 77 year old female with history of insulin-dependent diabetes mellitus, hyperlipidemia, hypothyroid, hypertension, GERD, who presents emergency department for chief concerns of fall.  Patient found to have mild rhabdomyolysis, urinary tract infection and periorbital ecchymosis with no fractures or intracranial pathology."  See H&P for full HPI on admission & ED course.   Further hospital course and management as outlined below.    Assessment and Plan: Rhabdomyolysis -Mild  Status post sodium chloride 1 L bolus per EDP, followed by NS at 100 mL/h Encourage p.o. hydration CK improving 459 >> 317 >> 219 normalized 12/12 Resume Lipitor tomorrow  Periorbital ecchymosis, left, initial encounter CT scan of the brain did not show any orbital fractures or acute intracranial pathology With left forehead ecchymosis, present on admission Secondary to falling at home As needed ice to the area Continue as needed pain medication   Numbness of fingers of both hands likely in the setting of alcohol use vs carpal tunnel syndrome Patient reported that this started after the fall and that she has never had this before MRI of the brain without acute intracranial pathology CT C-spine was negative for acute injuries  Normal levels of folic acid and vitamin B12   Alcohol use Patient states she drinks several times per week, drinking wine and/or vodka Monitored on CIWA and scores have been zero. Will d/c CIWA protocol and Ativan   Urinary tract infection Completed 3 days ceftriaxone  No urine culture was sent Pt now asymptomatic   Type 2 diabetes mellitus with hyperglycemia, with long-term current use of insulin (HCC) Hold metformin Tradjenta (sub for home Januvia) continued Sliding scale Novolog Goal  inpatient blood glucose level is 140-180   Mixed hyperlipidemia Atorvastatin held due to rhabdo - resume tomorrow   Essential (primary) hypertension Continue metoprolol  Resume home felodipine Hydralazine 5 mg IV PRN   Hypothyroidism Continue levothyroxine 75 mcg daily before breakfast   GERD  Continue PPI  Mood disorder Continue home Wellbutrin    DVT prophylaxis: Heparin 5000 units subcutaneous every 8 hours  Code Status: Full code  Diet: Heart healthy  Family Communication: None present. Pt is able to update.  Disposition Plan: SNF  Consults called: TOC     Subjective:  Patient reports feeling okay this AM. No fever/chills, UTI symptoms.  Feels generally weak. No other acute complaints or events reported.   Physical Exam: General exam: awake, alert, no acute distress HEENT: left anterior forehead abrasion with no surrounding erythema or swelling, moist mucus membranes, hearing grossly normal  Respiratory system: CTAB, no wheezes, rales or rhonchi, normal respiratory effort. Cardiovascular system: normal S1/S2, RRR, no pedal edema.   Gastrointestinal system: soft, NT, ND Central nervous system: A&O x 3. no gross focal neurologic deficits, normal speech Extremities: moves all, no edema, normal tone Skin: dry, intact, left dorsal hand is mildly erythematous uniformly and warm  Psychiatry: normal mood, congruent affect     Vitals:   09/26/23 0257 09/26/23 0600 09/26/23 0751 09/26/23 0957  BP: (!) 187/76 (!) 169/75 (!) 192/84 (!) 144/59  Pulse: 84  89 74  Resp: 18  (!) 24   Temp: 98.3 F (36.8 C)  97.6 F (36.4 C)   TempSrc: Oral  Oral   SpO2: 96%  96%   Weight:  Height:        Data Reviewed: I have reviewed patient's imaging including MRI of the brain CT scan I have also reviewed below mentioned lab report    Latest Ref Rng & Units 09/25/2023    3:15 AM 09/24/2023    6:20 AM 09/23/2023    2:37 PM  CMP  Glucose 70 - 99 mg/dL 213  086  578    BUN 8 - 23 mg/dL 13  12  10    Creatinine 0.44 - 1.00 mg/dL 4.69  6.29  5.28   Sodium 135 - 145 mmol/L 134  137  136   Potassium 3.5 - 5.1 mmol/L 3.8  3.6  4.6   Chloride 98 - 111 mmol/L 102  103  98   CO2 22 - 32 mmol/L 23  24  22    Calcium 8.9 - 10.3 mg/dL 8.9  8.9  9.6   Total Protein 6.5 - 8.1 g/dL   7.5   Total Bilirubin <1.2 mg/dL   1.6   Alkaline Phos 38 - 126 U/L   73   AST 15 - 41 U/L   69   ALT 0 - 44 U/L   69        Latest Ref Rng & Units 09/26/2023    4:51 AM 09/25/2023    3:15 AM 09/24/2023    6:20 AM  CBC  WBC 4.0 - 10.5 K/uL 11.5  10.7  10.9   Hemoglobin 12.0 - 15.0 g/dL 41.3  24.4  01.0   Hematocrit 36.0 - 46.0 % 40.3  37.4  38.0   Platelets 150 - 400 K/uL 289  239  250      Family Communication: None present at bedside  Disposition: Status is: Inpatient   Planned Discharge Destination: Needs SNF placement for rehab  Time spent: 38 minutes  Author: Pennie Banter, DO 09/26/2023 12:02 PM  For on call review [email or web address].

## 2023-09-26 NOTE — Care Management Important Message (Signed)
Important Message  Patient Details  Name: Pamela Smith MRN: [id number] Date of Birth: 02-Sep-1946   Important Message Given:  N/A - LOS <3 / Initial given by admissions     Olegario Messier A Ferdinand Revoir 09/26/2023, 8:26 AM

## 2023-09-26 NOTE — TOC Progression Note (Addendum)
Transition of Care [facility]) - Progression Note    Patient Details  Name: Pamela Smith MRN: [id number] Date of Birth: 10-28-1945  Transition of Care [facility]) CM/SW Contact  Chapman Fitch, RN Phone Number: 09/26/2023, 11:55 AM  Clinical Narrative:     Met with patient at bedside  Admitted WUJ:[id number]  Admitted from:home alone   Therapy recommending SNF.  Bed offer presented.  Patient accepts bed a [facility].  Accepted bed in HUB and notified Tanya at [facility]   Message sent to MD to determine when it is anticipated patient will medically be stable for discharge in order to start insurance auth   Update:  Per MD anticipated patient will medically be ready for dc tomorrow.  Auth started in navi portal      Expected Discharge Plan and Services                                               Social Determinants of Health (SDOH) Interventions SDOH Screenings   Food Insecurity: No Food Insecurity (09/24/2023)  Housing: Medium Risk (09/24/2023)  Transportation Needs: No Transportation Needs (09/24/2023)  Utilities: Not At Risk (09/24/2023)  Financial Resource Strain: Low Risk  (08/23/2023)   Received from [facility]  Tobacco Use: Medium Risk (09/23/2023)    Readmission Risk Interventions    09/24/2023   11:01 AM  Readmission Risk Prevention Plan  Post Dischage Appt Complete  Medication Screening Complete  Transportation Screening Complete

## 2023-09-27 DIAGNOSIS — R200 Anesthesia of skin: Secondary | ICD-10-CM | POA: Diagnosis not present

## 2023-09-27 DIAGNOSIS — I10 Essential (primary) hypertension: Secondary | ICD-10-CM | POA: Diagnosis not present

## 2023-09-27 DIAGNOSIS — Z794 Long term (current) use of insulin: Secondary | ICD-10-CM | POA: Diagnosis not present

## 2023-09-27 DIAGNOSIS — E1165 Type 2 diabetes mellitus with hyperglycemia: Secondary | ICD-10-CM | POA: Diagnosis not present

## 2023-09-27 LAB — GLUCOSE, CAPILLARY
Glucose-Capillary: 137 mg/dL — ABNORMAL HIGH (ref 70–99)
Glucose-Capillary: 187 mg/dL — ABNORMAL HIGH (ref 70–99)
Glucose-Capillary: 136 mg/dL — ABNORMAL HIGH (ref 70–99)
Glucose-Capillary: 241 mg/dL — ABNORMAL HIGH (ref 70–99)

## 2023-09-27 NOTE — Progress Notes (Signed)
Mobility Specialist - Progress Note    09/27/23 1000  Mobility  Activity Ambulated with assistance in hallway;Stood at bedside;Dangled on edge of bed  Level of Assistance Contact guard assist, steadying assist  Assistive Device Front wheel walker  Distance Ambulated (ft) 160 ft  Range of Motion/Exercises Active  Activity Response Tolerated well  Mobility Referral Yes  Mobility visit 1 Mobility  Mobility Specialist Start Time (ACUTE ONLY)  (1054)   Pt resting in bed on RA upon entry. Pt STS and ambulates to hallway around NS for 1 lap with RW CGA. Pt given VC for proper mechanics before turning to avoid falling, pt tends to cross feet trying to pivot during left turn. Pt x1 brief buckle during return back to room. Pt took x1 seated rest break for 2 minutes. Pt endorses no pain or dizziness. Pt returned to recliner and left with needs in reach. Chair alarm activated.   Johnathan Hausen Mobility Specialist 09/27/23, 11:05 AM

## 2023-09-27 NOTE — Care Management Important Message (Signed)
Important Message  Patient Details  Name: Pamela Smith MRN: [id number] Date of Birth: [birth year]   Important Message Given:  Yes - Medicare IM     Olegario Messier A Loretto Belinsky 09/27/2023, 11:09 AM

## 2023-09-27 NOTE — Progress Notes (Signed)
Progress Note   Patient: Pamela Smith [id number] DOB: 08-15-1946 DOA: 09/23/2023     4 DOS: the patient was seen and examined on 09/27/2023   Brief hospital course: "Ms. Pamela Smith is a 77 year old female with history of insulin-dependent diabetes mellitus, hyperlipidemia, hypothyroid, hypertension, GERD, who presents emergency department for chief concerns of fall.  Patient found to have mild rhabdomyolysis, urinary tract infection and periorbital ecchymosis with no fractures or intracranial pathology."  See H&P for full HPI on admission & ED course.   Further hospital course and management as outlined below.    Assessment and Plan: Rhabdomyolysis -Mild  Status post sodium chloride 1 L bolus per EDP, followed by NS at 100 mL/h Encourage p.o. hydration CK improving 459 >> 317 >> 219 normalized 12/12 Resume Lipitor tomorrow  Periorbital ecchymosis, left, initial encounter CT scan of the brain did not show any orbital fractures or acute intracranial pathology With left forehead ecchymosis, present on admission Secondary to falling at home As needed ice to the area Continue as needed pain medication   Numbness of fingers of both hands likely in the setting of alcohol use vs carpal tunnel syndrome Patient reported that this started after the fall and that she has never had this before MRI of the brain without acute intracranial pathology CT C-spine was negative for acute injuries  Normal levels of folic acid and vitamin B12   Alcohol use Patient states she drinks several times per week, drinking wine and/or vodka Monitored on CIWA and scores have been zero. Will d/c CIWA protocol and Ativan   Urinary tract infection Completed 3 days ceftriaxone  No urine culture was sent Pt now asymptomatic   Type 2 diabetes mellitus with hyperglycemia, with long-term current use of insulin (HCC) Hold metformin Tradjenta (sub for home Januvia) continued Sliding scale Novolog Goal  inpatient blood glucose level is 140-180   Mixed hyperlipidemia Atorvastatin held due to rhabdo - resume tomorrow   Essential (primary) hypertension Continue metoprolol  Resume home felodipine Hydralazine 5 mg IV PRN   Hypothyroidism Continue levothyroxine 75 mcg daily before breakfast   GERD  Continue PPI  Mood disorder Continue home Wellbutrin    DVT prophylaxis: Heparin 5000 units subcutaneous every 8 hours  Code Status: Full code  Diet: Heart healthy  Family Communication: Jane at bedside on rounds today 12/13  Disposition Plan: SNF  Consults called: TOC     Subjective:  Patient seen awake sitting up in bed with her friend visiting this AM.  Pt denies complaints, states feeling better.  Just weak in general but no other acute complaints.  Wants to start paperwork for HPOA   Physical Exam: General exam: awake, alert, no acute distress HEENT: left anterior forehead abrasion with no surrounding erythema or swelling, moist mucus membranes, hearing grossly normal  Respiratory system: CTAB, no wheezes, rales or rhonchi, normal respiratory effort. Cardiovascular system: normal S1/S2, RRR, no pedal edema.   Gastrointestinal system: soft, NT, ND Central nervous system: A&O x 3. no gross focal neurologic deficits, normal speech Extremities: moves all, no edema, normal tone Skin: dry, intact, stable and improving left dorsal hand mild erythema  Psychiatry: normal mood, congruent affect     Vitals:   09/27/23 0012 09/27/23 0327 09/27/23 0756 09/27/23 0757  BP: 132/70 (!) 171/60 (!) 137/91 (!) 137/91  Pulse: 66 74 74 74  Resp:  16 16 16   Temp:  (!) 97.5 F (36.4 C)  98 F (36.7 C)  TempSrc:  Oral  SpO2:  98% 96% 96%  Weight:      Height:        Data Reviewed: I have reviewed patient's imaging including MRI of the brain CT scan I have also reviewed below mentioned lab report    Latest Ref Rng & Units 09/25/2023    3:15 AM 09/24/2023    6:20 AM 09/23/2023     2:37 PM  CMP  Glucose 70 - 99 mg/dL 308  657  846   BUN 8 - 23 mg/dL 13  12  10    Creatinine 0.44 - 1.00 mg/dL 9.62  9.52  8.41   Sodium 135 - 145 mmol/L 134  137  136   Potassium 3.5 - 5.1 mmol/L 3.8  3.6  4.6   Chloride 98 - 111 mmol/L 102  103  98   CO2 22 - 32 mmol/L 23  24  22    Calcium 8.9 - 10.3 mg/dL 8.9  8.9  9.6   Total Protein 6.5 - 8.1 g/dL   7.5   Total Bilirubin <1.2 mg/dL   1.6   Alkaline Phos 38 - 126 U/L   73   AST 15 - 41 U/L   69   ALT 0 - 44 U/L   69        Latest Ref Rng & Units 09/26/2023    4:51 AM 09/25/2023    3:15 AM 09/24/2023    6:20 AM  CBC  WBC 4.0 - 10.5 K/uL 11.5  10.7  10.9   Hemoglobin 12.0 - 15.0 g/dL 32.4  40.1  02.7   Hematocrit 36.0 - 46.0 % 40.3  37.4  38.0   Platelets 150 - 400 K/uL 289  239  250      Family Communication: Rinaldo Ratel at bedside on rounds this AM  Disposition: Status is: Inpatient   Planned Discharge Destination: Needs SNF placement for rehab  Time spent: 38 minutes  Author: Pennie Banter, DO 09/27/2023 2:07 PM  For on call review [email or web address].

## 2023-09-27 NOTE — TOC Progression Note (Addendum)
Transition of Care [facility]) - Progression Note    Patient Details  Name: Pamela Smith MRN: [id number] Date of Birth: 12/25/1945  Transition of Care [facility]) CM/SW Contact  Margarito Liner, LCSW Phone Number: 09/27/2023, 8:48 AM  Clinical Narrative:  SNF insurance authorization is still pending.   1:07 pm: Auth still pending.  3:55 pm: Auth still pending.  4:06 pm: SNF confirmed they accept weekend admissions.  Expected Discharge Plan and Services                                               Social Determinants of Health (SDOH) Interventions SDOH Screenings   Food Insecurity: No Food Insecurity (09/24/2023)  Housing: Medium Risk (09/24/2023)  Transportation Needs: No Transportation Needs (09/24/2023)  Utilities: Not At Risk (09/24/2023)  Financial Resource Strain: Low Risk  (08/23/2023)   Received from [facility]  Tobacco Use: Medium Risk (09/23/2023)    Readmission Risk Interventions    09/24/2023   11:01 AM  Readmission Risk Prevention Plan  Post Dischage Appt Complete  Medication Screening Complete  Transportation Screening Complete

## 2023-09-27 NOTE — Progress Notes (Signed)
   09/27/23 1100  Spiritual Encounters  Type of Visit Initial  Care provided to: Patient  Referral source Chaplain assessment  Reason for visit Advance directives  OnCall Visit Yes   Chaplain received a spiritual consult for an AD. Chaplain gave patient the packet and educated her on advance directives. Patient would like to go over the paperwork with her sister tomorrow. Patient talked with the chaplain about growing up in [place] and her years of being a Horticulturist, commercial. Chaplain informed the patient that chaplain services is available for emotional and spiritual support whenever she needs.

## 2023-09-28 DIAGNOSIS — E1165 Type 2 diabetes mellitus with hyperglycemia: Secondary | ICD-10-CM | POA: Diagnosis not present

## 2023-09-28 DIAGNOSIS — E782 Mixed hyperlipidemia: Secondary | ICD-10-CM

## 2023-09-28 DIAGNOSIS — Z794 Long term (current) use of insulin: Secondary | ICD-10-CM | POA: Diagnosis not present

## 2023-09-28 DIAGNOSIS — I10 Essential (primary) hypertension: Secondary | ICD-10-CM | POA: Diagnosis not present

## 2023-09-28 LAB — GLUCOSE, CAPILLARY
Glucose-Capillary: 208 mg/dL — ABNORMAL HIGH (ref 70–99)
Glucose-Capillary: 282 mg/dL — ABNORMAL HIGH (ref 70–99)
Glucose-Capillary: 180 mg/dL — ABNORMAL HIGH (ref 70–99)
Glucose-Capillary: 243 mg/dL — ABNORMAL HIGH (ref 70–99)

## 2023-09-28 LAB — CBC
HCT: 36.3 % (ref 36.0–46.0)
Hemoglobin: 11.9 g/dL — ABNORMAL LOW (ref 12.0–15.0)
MCH: 29.7 pg (ref 26.0–34.0)
MCHC: 32.8 g/dL (ref 30.0–36.0)
MCV: 90.5 fL (ref 80.0–100.0)
Platelets: 219 10*3/uL (ref 150–400)
RBC: 4.01 MIL/uL (ref 3.87–5.11)
RDW: 13.3 % (ref 11.5–15.5)
WBC: 9.4 10*3/uL (ref 4.0–10.5)
nRBC: 0 % (ref 0.0–0.2)

## 2023-09-28 NOTE — Progress Notes (Signed)
Occupational Therapy Treatment Patient Details Name: Pamela Smith MRN: [id number] DOB: May 02, 1946 Today's Date: 09/28/2023   History of present illness Pt is a 77 year old female with history of insulin-dependent diabetes mellitus, hyperlipidemia, hypothyroid, hypertension, GERD, who presents emergency department for chief concerns of fall.  Work up showed mild rhabdomyolysis, urinary tract infection and periorbital ecchymosis.   OT comments  Patient agreeable to OT. Pt received sitting up in bed with family present. She reported just getting back in bed after completing toileting routine. She deferred ambulating to the sink in order to complete standing grooming tasks, however, she was agreeable to bed level grooming and BUE AROM exercises. Tolerated well. Pt left in bed with all needs in reach. D/C recommendation remains appropriate. OT will continue to follow acutely.        If plan is discharge home, recommend the following:  A little help with walking and/or transfers;A little help with bathing/dressing/bathroom;Help with stairs or ramp for entrance;Assist for transportation;Assistance with cooking/housework   Equipment Recommendations  BSC/3in1    Recommendations for Other Services      Precautions / Restrictions Precautions Precautions: Fall Restrictions Weight Bearing Restrictions Per Provider Order: No       Mobility Bed Mobility Overal bed mobility: Needs Assistance     General bed mobility comments: pt deferred    Transfers Overall transfer level: Needs assistance         General transfer comment: pt deferred     Balance       ADL either performed or assessed with clinical judgement   ADL Overall ADL's : Needs assistance/impaired     Grooming: Set up;Bed level;Supervision/safety;Wash/dry face;Oral care;Minimal assistance Grooming Details (indicate cue type and reason): Assistance required to squeeze toothpaste tube. Pt deferred walking to sink to  complete grooming tasks despite encouragement.     General ADL Comments: Pt deferred toileting stating she just got up to the bathroom.    Extremity/Trunk Assessment              Vision Patient Visual Report: No change from baseline     Perception     Praxis      Cognition Arousal: Alert Behavior During Therapy: WFL for tasks assessed/performed Overall Cognitive Status: Within Functional Limits for tasks assessed Area of Impairment: Problem solving, Safety/judgement         Safety/Judgement: Decreased awareness of deficits   Problem Solving: Slow processing          Exercises Other Exercises Other Exercises: Pt completed bed level BUE AROM exercises x10 reps each including shoulder flexion, elbow flexion, elbow extension, shoulder horizontal adduction/abduction.    Shoulder Instructions       General Comments      Pertinent Vitals/ Pain       Pain Assessment Pain Assessment: No/denies pain  Home Living Family/patient expects to be discharged to:: Private residence Living Arrangements: Alone Available Help at Discharge: Available PRN/intermittently;Family;Friend(s) Type of Home: House Home Access: Level entry     Home Layout: One level     Bathroom Shower/Tub: Chief Strategy Officer: Standard     Home Equipment: Cane - single point          Prior Functioning/Environment              Frequency  Min 1X/week        Progress Toward Goals  OT Goals(current goals can now be found in the care plan section)     Acute Rehab OT Goals Patient  Stated Goal: go home OT Goal Formulation: With patient Time For Goal Achievement: 10/09/23 Potential to Achieve Goals: Fair  Plan      Co-evaluation                 AM-PAC OT "6 Clicks" Daily Activity     Outcome Measure   Help from another person eating meals?: None Help from another person taking care of personal grooming?: A Little Help from another person toileting,  which includes using toliet, bedpan, or urinal?: A Little Help from another person bathing (including washing, rinsing, drying)?: A Little Help from another person to put on and taking off regular upper body clothing?: A Little Help from another person to put on and taking off regular lower body clothing?: A Little 6 Click Score: 19    End of Session    OT Visit Diagnosis: Other abnormalities of gait and mobility (R26.89)   Activity Tolerance Patient tolerated treatment well   Patient Left in bed;with call bell/phone within reach;with bed alarm set   Nurse Communication Mobility status        Time: 1610-9604 OT Time Calculation (min): 14 min  Charges: OT General Charges $OT Visit: 1 Visit OT Treatments $Self Care/Home Management : 8-22 mins  [facility] MS, OTR/L ascom [phone]  09/28/23, 3:15 PM

## 2023-09-28 NOTE — Progress Notes (Signed)
Progress Note   Patient: Pamela Smith [id number] DOB: June 07, 1946 DOA: 09/23/2023     5 DOS: the patient was seen and examined on 09/28/2023   Brief hospital course: "Ms. Pamela Smith is a 77 year old female with history of insulin-dependent diabetes mellitus, hyperlipidemia, hypothyroid, hypertension, GERD, who presents emergency department for chief concerns of fall.  Patient found to have mild rhabdomyolysis, urinary tract infection and periorbital ecchymosis with no fractures or intracranial pathology."  See H&P for full HPI on admission & ED course.   Further hospital course and management as outlined below.    Assessment and Plan: Rhabdomyolysis -Mild  Status post sodium chloride 1 L bolus per EDP, followed by NS at 100 mL/h Encourage p.o. hydration CK improving 459 >> 317 >> 219 normalized 12/12 Resumed Lipitor   Periorbital ecchymosis, left, initial encounter CT scan of the brain did not show any orbital fractures or acute intracranial pathology With left forehead ecchymosis, present on admission Secondary to falling at home As needed ice to the area Continue as needed pain medication   Numbness of fingers of both hands likely in the setting of alcohol use vs carpal tunnel syndrome Patient reported that this started after the fall and that she has never had this before MRI of the brain without acute intracranial pathology CT C-spine was negative for acute injuries  Normal levels of folic acid and vitamin B12   Alcohol use Patient states she drinks several times per week, drinking wine and/or vodka Monitored on CIWA and scores have been zero. d/c'd CIWA protocol and Ativan   Urinary tract infection Completed 3 days ceftriaxone  No urine culture was sent Pt now asymptomatic   Type 2 diabetes mellitus with hyperglycemia, with long-term current use of insulin (HCC) Hold metformin Tradjenta (sub for home Januvia) continued Sliding scale Novolog Goal inpatient  blood glucose level is 140-180   Mixed hyperlipidemia Atorvastatin held due to rhabdo - resume tomorrow   Essential (primary) hypertension Continue metoprolol  Resume home felodipine Hydralazine 5 mg IV PRN   Hypothyroidism Continue levothyroxine 75 mcg daily before breakfast   GERD  Continue PPI  Mood disorder Continue home Wellbutrin    DVT prophylaxis: Heparin 5000 units subcutaneous every 8 hours  Code Status: Full code  Diet: Heart healthy  Family Communication: Jane at bedside on rounds today 12/13  Disposition Plan: SNF  Consults called: TOC     Subjective:  Patient seen awake sitting up in bed this AM.  She reports feeling well overall.  Denies acute complaints.  Awaiting auth for rehab for d/c.   Physical Exam: General exam: awake, alert, no acute distress HEENT: improving left anterior forehead abrasion with no surrounding erythema or swelling, moist mucus membranes, hearing grossly normal  Respiratory system: lungs clear, normal respiratory effort on room air. Cardiovascular system: normal S1/S2, RRR, no pedal edema.   Gastrointestinal system: soft, NT, ND Central nervous system: A&O x 3. no gross focal neurologic deficits, normal speech Extremities: moves all, no edema, normal tone Psychiatry: normal mood, congruent affect, judgement and insight appear normal     Vitals:   09/27/23 1407 09/27/23 2025 09/28/23 0352 09/28/23 0835  BP: (!) 116/53 (!) 138/59 (!) 157/59 (!) 166/68  Pulse: 68 78 73 76  Resp:  20 16 18   Temp:  97.6 F (36.4 C) 97.9 F (36.6 C) 97.7 F (36.5 C)  TempSrc:  Oral Oral Oral  SpO2:  95% 93% 94%  Weight:      Height:  Data Reviewed: I have reviewed patient's imaging including MRI of the brain CT scan I have also reviewed below mentioned lab report    Latest Ref Rng & Units 09/25/2023    3:15 AM 09/24/2023    6:20 AM 09/23/2023    2:37 PM  CMP  Glucose 70 - 99 mg/dL 295  621  308   BUN 8 - 23 mg/dL 13  12   10    Creatinine 0.44 - 1.00 mg/dL 6.57  8.46  9.62   Sodium 135 - 145 mmol/L 134  137  136   Potassium 3.5 - 5.1 mmol/L 3.8  3.6  4.6   Chloride 98 - 111 mmol/L 102  103  98   CO2 22 - 32 mmol/L 23  24  22    Calcium 8.9 - 10.3 mg/dL 8.9  8.9  9.6   Total Protein 6.5 - 8.1 g/dL   7.5   Total Bilirubin <1.2 mg/dL   1.6   Alkaline Phos 38 - 126 U/L   73   AST 15 - 41 U/L   69   ALT 0 - 44 U/L   69        Latest Ref Rng & Units 09/28/2023    5:39 AM 09/26/2023    4:51 AM 09/25/2023    3:15 AM  CBC  WBC 4.0 - 10.5 K/uL 9.4  11.5  10.7   Hemoglobin 12.0 - 15.0 g/dL 95.2  84.1  32.4   Hematocrit 36.0 - 46.0 % 36.3  40.3  37.4   Platelets 150 - 400 K/uL 219  289  239      Family Communication: none present.  Erskine Squibb updated at bedside on 12/13.  Disposition: Status is: Inpatient   Planned Discharge Destination: Needs SNF placement for rehab  Time spent: 38 minutes  Author: Pennie Banter, DO 09/28/2023 12:07 PM  For on call review [email or web address].

## 2023-09-28 NOTE — Progress Notes (Signed)
Mobility Specialist - Progress Note     09/28/23 1124  Mobility  Activity Ambulated with assistance in hallway;Stood at bedside;Dangled on edge of bed  Level of Assistance Contact guard assist, steadying assist  Assistive Device Front wheel walker  Distance Ambulated (ft) 180 ft  Range of Motion/Exercises Active  Activity Response Tolerated well  Mobility Referral Yes  Mobility visit 1 Mobility   Pt resting in bed on RA upon entry. Pt STS and ambulates to hallway around NS CGA with RW. Pt took x3 seated rest break and had several bouts of LOB throughout session but recovered quickly. Pt returned to bed and left with needs in reach and bed alarm activated.   Johnathan Hausen Mobility Specialist 09/28/23, 4:10 PM

## 2023-09-28 NOTE — TOC Progression Note (Addendum)
Transition of Care [facility]) - Progression Note    Patient Details  Name: DALISSA LEISINGER MRN: [id number] Date of Birth: 10/08/1946  [facility]) CM/SW Contact  Liliana Cline, LCSW Phone Number: 09/28/2023, 9:42 AM  Clinical Narrative:    [facility] Harvey pending in [employer] portal.   2:11- Auth still pending.       Expected Discharge Plan and Services                                               Social Determinants of Health (SDOH) Interventions SDOH Screenings   Food Insecurity: No Food Insecurity (09/24/2023)  Housing: Medium Risk (09/24/2023)  Transportation Needs: No Transportation Needs (09/24/2023)  Utilities: Not At Risk (09/24/2023)  Financial Resource Strain: Low Risk  (08/23/2023)   Received from [facility]  Tobacco Use: Medium Risk (09/23/2023)    Readmission Risk Interventions    09/24/2023   11:01 AM  Readmission Risk Prevention Plan  Post Dischage Appt Complete  Medication Screening Complete  Transportation Screening Complete

## 2023-09-29 DIAGNOSIS — E1165 Type 2 diabetes mellitus with hyperglycemia: Secondary | ICD-10-CM | POA: Diagnosis not present

## 2023-09-29 DIAGNOSIS — I10 Essential (primary) hypertension: Secondary | ICD-10-CM | POA: Diagnosis not present

## 2023-09-29 DIAGNOSIS — E039 Hypothyroidism, unspecified: Secondary | ICD-10-CM | POA: Diagnosis not present

## 2023-09-29 DIAGNOSIS — Z794 Long term (current) use of insulin: Secondary | ICD-10-CM | POA: Diagnosis not present

## 2023-09-29 LAB — GLUCOSE, CAPILLARY
Glucose-Capillary: 205 mg/dL — ABNORMAL HIGH (ref 70–99)
Glucose-Capillary: 228 mg/dL — ABNORMAL HIGH (ref 70–99)
Glucose-Capillary: 204 mg/dL — ABNORMAL HIGH (ref 70–99)
Glucose-Capillary: 277 mg/dL — ABNORMAL HIGH (ref 70–99)

## 2023-09-29 MED ORDER — INSULIN ASPART 100 UNIT/ML IJ SOLN
7.0000 [IU] | Freq: Three times a day (TID) | INTRAMUSCULAR | Status: DC
  Administered 2023-09-29 – 2023-10-01 (×6): 7 [IU] via SUBCUTANEOUS
  Filled 2023-09-29 (×6): qty 1

## 2023-09-29 NOTE — TOC Progression Note (Addendum)
Transition of Care [facility]) - Progression Note    Patient Details  Name: Pamela Smith MRN: [id number] Date of Birth: [birth year]  Transition of Care [facility]) CM/SW Contact  Liliana Cline, LCSW Phone Number: 09/29/2023, 8:53 AM  Clinical Narrative:    B[facility] Harvey still pending.  1:59- Auth still pending.       Expected Discharge Plan and Services                                               Social Determinants of Health (SDOH) Interventions SDOH Screenings   Food Insecurity: No Food Insecurity (09/24/2023)  Housing: Medium Risk (09/24/2023)  Transportation Needs: No Transportation Needs (09/24/2023)  Utilities: Not At Risk (09/24/2023)  Financial Resource Strain: Low Risk  (08/23/2023)   Received from [facility]  Tobacco Use: Medium Risk (09/23/2023)    Readmission Risk Interventions    09/24/2023   11:01 AM  Readmission Risk Prevention Plan  Post Dischage Appt Complete  Medication Screening Complete  Transportation Screening Complete

## 2023-09-29 NOTE — Progress Notes (Addendum)
Progress Note   Patient: Pamela Smith ZOX:[id number] DOB: 1946/06/06 DOA: 09/23/2023     6 DOS: the patient was seen and examined on 09/29/2023   Brief hospital course: "Ms. Kiann Colvert is a 77 year old female with history of insulin-dependent diabetes mellitus, hyperlipidemia, hypothyroid, hypertension, GERD, who presents emergency department for chief concerns of fall.  Patient found to have mild rhabdomyolysis, urinary tract infection and periorbital ecchymosis with no fractures or intracranial pathology."  See H&P for full HPI on admission & ED course.   Further hospital course and management as outlined below.  Medically stable awaiting SNF placement.    Assessment and Plan: Rhabdomyolysis -Mild  Status post sodium chloride 1 L bolus per EDP, followed by NS at 100 mL/h Encourage p.o. hydration CK improving 459 >> 317 >> 219 normalized 12/12 Resumed Lipitor   Periorbital ecchymosis, left, initial encounter CT scan of the brain did not show any orbital fractures or acute intracranial pathology With left forehead ecchymosis, present on admission Secondary to falling at home As needed ice to the area Continue as needed pain medication   Numbness of fingers of both hands likely in the setting of alcohol use vs carpal tunnel syndrome Patient reported that this started after the fall and that she has never had this before MRI of the brain without acute intracranial pathology CT C-spine was negative for acute injuries  Normal levels of folic acid and vitamin B12   Alcohol use Patient states she drinks several times per week, drinking wine and/or vodka Monitored on CIWA and scores have been zero. d/c'd CIWA protocol and Ativan   Urinary tract infection Completed 3 days ceftriaxone  No urine culture was sent Pt now asymptomatic   Type 2 diabetes mellitus with hyperglycemia, with long-term current use of insulin (HCC) CBG's above goal with several > 200 Holding  metformin Tradjenta (sub for home Januvia) continued Basal insulin 50 units daily Increase scheduled Novolog 5 >> 7 units TID + sliding scale  Goal inpatient blood glucose level is 140-180   Mixed hyperlipidemia Atorvastatin resumed, initially held due to rhabdo   Essential (primary) hypertension Continue metoprolol, felodipine Hydralazine 5 mg IV PRN   Hypothyroidism Continue levothyroxine 75 mcg daily before breakfast   GERD  Continue PPI  Mood disorder Continue home Wellbutrin    DVT prophylaxis: Heparin 5000 units subcutaneous every 8 hours  Code Status: Full code  Diet: Heart healthy  Family Communication: Jane at bedside on rounds today 12/13  Disposition Plan: SNF  Consults called: TOC     Subjective:  Patient seen awake having breakfast in bed this AM.  She reports feeling well.  Denies any acute complaints.   Physical Exam: General exam: awake, alert, no acute distress HEENT: improving left anterior forehead abrasion with no surrounding erythema or swelling, moist mucus membranes, hearing grossly normal  Respiratory system: normal respiratory effort on room air. Cardiovascular system: normal S1/S2, RRR, no pedal edema.   Gastrointestinal system: soft, NT, ND Central nervous system: A&O x 3. no gross focal neurologic deficits, normal speech Extremities: moves all, no edema, normal tone Psychiatry: normal mood, congruent affect, judgement and insight appear normal     Vitals:   09/28/23 1652 09/28/23 2015 09/29/23 0440 09/29/23 0823  BP: (!) 151/71 (!) 138/50 (!) 148/57 (!) 158/66  Pulse: 74 77 75 72  Resp: 18 18 20 18   Temp: 97.7 F (36.5 C) 97.8 F (36.6 C) 97.8 F (36.6 C) 98 F (36.7 C)  TempSrc: Oral Oral  Oral   SpO2: 97% 97% 95% 97%  Weight:      Height:        Data Reviewed:      Latest Ref Rng & Units 09/25/2023    3:15 AM 09/24/2023    6:20 AM 09/23/2023    2:37 PM  CMP  Glucose 70 - 99 mg/dL 536  644  034   BUN 8 - 23  mg/dL 13  12  10    Creatinine 0.44 - 1.00 mg/dL 7.42  5.95  6.38   Sodium 135 - 145 mmol/L 134  137  136   Potassium 3.5 - 5.1 mmol/L 3.8  3.6  4.6   Chloride 98 - 111 mmol/L 102  103  98   CO2 22 - 32 mmol/L 23  24  22    Calcium 8.9 - 10.3 mg/dL 8.9  8.9  9.6   Total Protein 6.5 - 8.1 g/dL   7.5   Total Bilirubin <1.2 mg/dL   1.6   Alkaline Phos 38 - 126 U/L   73   AST 15 - 41 U/L   69   ALT 0 - 44 U/L   69        Latest Ref Rng & Units 09/28/2023    5:39 AM 09/26/2023    4:51 AM 09/25/2023    3:15 AM  CBC  WBC 4.0 - 10.5 K/uL 9.4  11.5  10.7   Hemoglobin 12.0 - 15.0 g/dL 75.6  43.3  29.5   Hematocrit 36.0 - 46.0 % 36.3  40.3  37.4   Platelets 150 - 400 K/uL 219  289  239      Family Communication: none present.  Erskine Squibb updated at bedside on 12/13.  Disposition: Status is: Inpatient   Planned Discharge Destination: Needs SNF placement for rehab  Time spent: 35 minutes  Author: Pennie Banter, DO 09/29/2023 11:19 AM  For on call review [email or web address].

## 2023-09-29 NOTE — Plan of Care (Signed)

## 2023-09-29 NOTE — Progress Notes (Signed)
Physical Therapy Treatment Patient Details Name: Pamela Smith MRN: [id number] DOB: 09-Feb-1946 Today's Date: 09/29/2023   History of Present Illness Pt is a 77 year old female with history of insulin-dependent diabetes mellitus, hyperlipidemia, hypothyroid, hypertension, GERD, who presents emergency department for chief concerns of fall.  Work up showed mild rhabdomyolysis, urinary tract infection and periorbital ecchymosis.    PT Comments  Ready for session.  Bed mobility with cga and rails.  She stands with cga x 1.  Completes x 1 lap with RW and overall poor gait quality with several instances of LOB needing assist and cues to recover.  She does seem to fatigue quicker today and needs cues to slow down and step up into her walker.  Initially pt wanted to sit in chair but upon return to room she wants to get back to bed due to fatigue.  Increased fall risk especially on longer distances and should have +1 assist for mobility.  Pt stated she was independent prior to admission and is aware she would have trouble managing at home on her own.  Recommend <3 hours per day therapy upon discharge.   If plan is discharge home, recommend the following: A lot of help with walking and/or transfers;A lot of help with bathing/dressing/bathroom;Assist for transportation;Assistance with cooking/housework;Help with stairs or ramp for entrance   Can travel by private vehicle        Equipment Recommendations       Recommendations for Other Services       Precautions / Restrictions Precautions Precautions: Fall Restrictions Weight Bearing Restrictions Per Provider Order: No     Mobility  Bed Mobility Overal bed mobility: Needs Assistance Bed Mobility: Supine to Sit, Sit to Supine     Supine to sit: Contact guard, Used rails, HOB elevated Sit to supine: Contact guard assist, Used rails, HOB elevated     Patient Response: Cooperative  Transfers Overall transfer level: Needs  assistance Equipment used: Rolling walker (2 wheels) Transfers: Sit to/from Stand Sit to Stand: Contact guard assist                Ambulation/Gait Ambulation/Gait assistance: Min assist Gait Distance (Feet): 200 Feet Assistive device: Rolling walker (2 wheels) Gait Pattern/deviations: Step-through pattern, Trunk flexed Gait velocity: dec     General Gait Details: generally unsteady with several imbalances today requiring assist to prevent fall   Stairs             Wheelchair Mobility     Tilt Bed Tilt Bed Patient Response: Cooperative  Modified Rankin (Stroke Patients Only)       Balance Overall balance assessment: Needs assistance Sitting-balance support: Feet supported Sitting balance-Leahy Scale: Good     Standing balance support: Bilateral upper extremity supported Standing balance-Leahy Scale: Poor                              Cognition Arousal: Alert Behavior During Therapy: WFL for tasks assessed/performed Overall Cognitive Status: Within Functional Limits for tasks assessed                                          Exercises      General Comments        Pertinent Vitals/Pain Pain Assessment Pain Assessment: No/denies pain    Home Living  Prior Function            PT Goals (current goals can now be found in the care plan section) Progress towards PT goals: Progressing toward goals    Frequency    Min 1X/week      PT Plan      Co-evaluation              AM-PAC PT "6 Clicks" Mobility   Outcome Measure  Help needed turning from your back to your side while in a flat bed without using bedrails?: None Help needed moving from lying on your back to sitting on the side of a flat bed without using bedrails?: None Help needed moving to and from a bed to a chair (including a wheelchair)?: A Little Help needed standing up from a chair using your arms (e.g.,  wheelchair or bedside chair)?: A Little Help needed to walk in hospital room?: A Little Help needed climbing 3-5 steps with a railing? : A Lot 6 Click Score: 19    End of Session Equipment Utilized During Treatment: Gait belt Activity Tolerance: Patient limited by fatigue Patient left: in bed;with call bell/phone within reach;with bed alarm set   PT Visit Diagnosis: Other abnormalities of gait and mobility (R26.89);Muscle weakness (generalized) (M62.81);Difficulty in walking, not elsewhere classified (R26.2)     Time: 1610-9604 PT Time Calculation (min) (ACUTE ONLY): 13 min  Charges:    $Gait Training: 8-22 mins PT General Charges $$ ACUTE PT VISIT: 1 Visit                   Danielle Dess, PTA 09/29/23, 3:06 PM

## 2023-09-30 DIAGNOSIS — T796XXA Traumatic ischemia of muscle, initial encounter: Secondary | ICD-10-CM | POA: Diagnosis not present

## 2023-09-30 LAB — PHOSPHORUS: Phosphorus: 3.7 mg/dL (ref 2.5–4.6)

## 2023-09-30 LAB — CBC
HCT: 36.2 % (ref 36.0–46.0)
Hemoglobin: 11.8 g/dL — ABNORMAL LOW (ref 12.0–15.0)
MCH: 29.9 pg (ref 26.0–34.0)
MCHC: 32.6 g/dL (ref 30.0–36.0)
MCV: 91.9 fL (ref 80.0–100.0)
Platelets: 234 10*3/uL (ref 150–400)
RBC: 3.94 MIL/uL (ref 3.87–5.11)
RDW: 13.6 % (ref 11.5–15.5)
WBC: 9 10*3/uL (ref 4.0–10.5)
nRBC: 0 % (ref 0.0–0.2)

## 2023-09-30 LAB — BASIC METABOLIC PANEL
Anion gap: 10 (ref 5–15)
BUN: 27 mg/dL — ABNORMAL HIGH (ref 8–23)
CO2: 22 mmol/L (ref 22–32)
Calcium: 9.2 mg/dL (ref 8.9–10.3)
Chloride: 101 mmol/L (ref 98–111)
Creatinine, Ser: 1.19 mg/dL — ABNORMAL HIGH (ref 0.44–1.00)
GFR, Estimated: 47 mL/min — ABNORMAL LOW (ref >60)
Glucose, Bld: 361 mg/dL — ABNORMAL HIGH (ref 70–99)
Potassium: 4.8 mmol/L (ref 3.5–5.1)
Sodium: 133 mmol/L — ABNORMAL LOW (ref 135–145)

## 2023-09-30 LAB — GLUCOSE, CAPILLARY
Glucose-Capillary: 198 mg/dL — ABNORMAL HIGH (ref 70–99)
Glucose-Capillary: 293 mg/dL — ABNORMAL HIGH (ref 70–99)
Glucose-Capillary: 193 mg/dL — ABNORMAL HIGH (ref 70–99)
Glucose-Capillary: 349 mg/dL — ABNORMAL HIGH (ref 70–99)

## 2023-09-30 LAB — MAGNESIUM: Magnesium: 1.8 mg/dL (ref 1.7–2.4)

## 2023-09-30 MED ORDER — VITAMIN B-12 1000 MCG PO TABS
1000.0000 ug | ORAL_TABLET | Freq: Every day | ORAL | Status: DC
Start: 2023-09-30 — End: 2023-10-04
  Administered 2023-09-30 – 2023-10-04 (×5): 1000 ug via ORAL
  Filled 2023-09-30 (×5): qty 1

## 2023-09-30 NOTE — Progress Notes (Signed)
Progress Note   Patient: Pamela Smith:[id number] DOB: [birth year] DOA: 09/23/2023     7 DOS: the patient was seen and examined on 09/30/2023   Brief hospital course: "Ms. Pamela Smith is a 77 year old female with history of insulin-dependent diabetes mellitus, hyperlipidemia, hypothyroid, hypertension, GERD, who presents emergency department for chief concerns of fall.  Patient found to have mild rhabdomyolysis, urinary tract infection and periorbital ecchymosis with no fractures or intracranial pathology."  See H&P for full HPI on admission & ED course.   Further hospital course and management as outlined below.  Medically stable awaiting SNF placement.    Assessment and Plan: Rhabdomyolysis -Mild  Status post sodium chloride 1 L bolus per EDP, followed by NS at 100 mL/h Encourage p.o. hydration CK improving 459 >> 317 >> 219 normalized 12/12 Resumed Lipitor   Periorbital ecchymosis, left, initial encounter CT scan of the brain did not show any orbital fractures or acute intracranial pathology With left forehead ecchymosis, present on admission Secondary to falling at home As needed ice to the area Continue as needed pain medication   Numbness of fingers of both hands likely in the setting of alcohol use vs carpal tunnel syndrome Patient reported that this started after the fall and that she has never had this before MRI of the brain without acute intracranial pathology CT C-spine was negative for acute injuries  Normal levels of folic acid and vitamin B12   Alcohol use Patient states she drinks several times per week, drinking wine and/or vodka Monitored on CIWA and scores have been zero. d/c'd CIWA protocol and Ativan   Urinary tract infection Completed 3 days ceftriaxone  No urine culture was sent Pt now asymptomatic   Type 2 diabetes mellitus with hyperglycemia, with long-term current use of insulin (HCC) CBG's above goal with several > 200 Holding  metformin Tradjenta (sub for home Januvia) continued Basal insulin 50 units daily Increase scheduled Novolog 5 >> 7 units TID + sliding scale  Goal inpatient blood glucose level is 140-180   Mixed hyperlipidemia Atorvastatin resumed, initially held due to rhabdo   Essential (primary) hypertension Continue metoprolol, felodipine Hydralazine 5 mg IV PRN   Hypothyroidism Continue levothyroxine 75 mcg daily before breakfast   GERD  Continue PPI  Mood disorder Continue home Wellbutrin  Vitamin B12 level 224, goal >400, started B12 1000 mcg p.o. daily.  Follow with PCP to repeat vitamin B12 level after 3 to 6 months.    DVT prophylaxis: Heparin 5000 units subcutaneous every 8 hours  Code Status: Full code  Diet: Heart healthy  Family Communication: Pamela Smith at bedside on rounds today 12/13  Disposition Plan: SNF  Consults called: TOC     Subjective: No significant events overnight.  Patient was lying comfortably, denied any headache or dizziness, no knee pain.  Patient was awaiting for insurance authorization. Denied any complaints   Physical Exam: General exam: awake, alert, no acute distress HEENT: improving left anterior forehead abrasion with no surrounding erythema or swelling, moist mucus membranes, hearing grossly normal  Respiratory system: normal respiratory effort on room air. Cardiovascular system: normal S1/S2, RRR, no pedal edema.   Gastrointestinal system: soft, NT, ND Central nervous system: A&O x 3. no gross focal neurologic deficits, normal speech Extremities: moves all, no edema, normal tone Psychiatry: normal mood, congruent affect, judgement and insight appear normal     Vitals:   09/29/23 1652 09/29/23 1950 09/30/23 0400 09/30/23 0745  BP: (!) 147/65 (!) 141/63 (!) 163/60 (!) 158/53  Pulse: 72 72 69 73  Resp: 18 18 16 18   Temp: 98.8 F (37.1 C) 97.8 F (36.6 C) 98.1 F (36.7 C) 97.8 F (36.6 C)  TempSrc:  Oral Oral Oral  SpO2: 97% 95% 95%  96%  Weight:      Height:        Data Reviewed:      Latest Ref Rng & Units 09/30/2023   10:15 AM 09/25/2023    3:15 AM 09/24/2023    6:20 AM  CMP  Glucose 70 - 99 mg/dL 161  096  045   BUN 8 - 23 mg/dL 27  13  12    Creatinine 0.44 - 1.00 mg/dL 4.09  8.11  9.14   Sodium 135 - 145 mmol/L 133  134  137   Potassium 3.5 - 5.1 mmol/L 4.8  3.8  3.6   Chloride 98 - 111 mmol/L 101  102  103   CO2 22 - 32 mmol/L 22  23  24    Calcium 8.9 - 10.3 mg/dL 9.2  8.9  8.9        Latest Ref Rng & Units 09/30/2023   10:15 AM 09/28/2023    5:39 AM 09/26/2023    4:51 AM  CBC  WBC 4.0 - 10.5 K/uL 9.0  9.4  11.5   Hemoglobin 12.0 - 15.0 g/dL 78.2  95.6  21.3   Hematocrit 36.0 - 46.0 % 36.2  36.3  40.3   Platelets 150 - 400 K/uL 234  219  289      Family Communication: none present.  Pamela Smith updated at bedside on 12/13.  Disposition: Status is: Inpatient   Planned Discharge Destination: Needs SNF placement for rehab 12/16 peer-to-peer done, insurance denied Informed to [facility], awaiting for official denial and patient would probably appeal the discharge.   Time spent: 35 minutes  Author: Gillis Santa, MD 09/30/2023 2:05 PM  For on call review [email or web address].

## 2023-09-30 NOTE — TOC Progression Note (Signed)
Transition of Care [facility]) - Progression Note    Patient Details  Name: Pamela Smith MRN: [id number] Date of Birth: Nov 12, 1945  Transition of Care [facility]) CM/SW Contact  Chapman Fitch, RN Phone Number: 09/30/2023, 9:09 AM  Clinical Narrative:        Insurance auth pending for [facility]     Expected Discharge Plan and Services                                               Social Determinants of Health (SDOH) Interventions SDOH Screenings   Food Insecurity: No Food Insecurity (09/24/2023)  Housing: Medium Risk (09/24/2023)  Transportation Needs: No Transportation Needs (09/24/2023)  Utilities: Not At Risk (09/24/2023)  Financial Resource Strain: Low Risk  (08/23/2023)   Received from [facility]  Tobacco Use: Medium Risk (09/23/2023)    Readmission Risk Interventions    09/24/2023   11:01 AM  Readmission Risk Prevention Plan  Post Dischage Appt Complete  Medication Screening Complete  Transportation Screening Complete

## 2023-09-30 NOTE — Inpatient Diabetes Management (Signed)
Inpatient Diabetes Program Recommendations  AACE/ADA: New Consensus Statement on Inpatient Glycemic Control   Target Ranges:  Prepandial:   less than 140 mg/dL      Peak postprandial:   less than 180 mg/dL (1-2 hours)      Critically ill patients:  140 - 180 mg/dL    Latest Reference Range & Units 09/29/23 07:37 09/29/23 11:50 09/29/23 17:05 09/29/23 20:15 09/30/23 07:45  Glucose-Capillary 70 - 99 mg/dL 161 (H) 096 (H) 045 (H) 277 (H) 198 (H)   Review of Glycemic Control  Diabetes history: DM2 Outpatient Diabetes medications: Lantus 50 units daily, Metformin 1000 mg BID, Januvia 100 mg daily Current orders for Inpatient glycemic control: Semglee 50 units daily, Novolog 0-15 units TID with meals, Novolog 0-5 units at bedtime, Novolog 7 units TID with meals, Tradjenta 5 mg daily  Inpatient Diabetes Program Recommendations:    Insulin: Please consider increasing Semglee to 52 units daily and meal coverage to Novolog 10 units TID with meals.  Thanks, Orlando Penner, RN, MSN, CDCES Diabetes Coordinator Inpatient Diabetes Program [phone] (Team Pager from 8am to 5pm)

## 2023-09-30 NOTE — TOC Progression Note (Signed)
Transition of Care [facility] - Progression Note    Patient Details  Name: Pamela Smith MRN: [id number] Date of Birth: Oct 28, 1945  [facility]) CM/SW Contact  Chapman Fitch, RN Phone Number: 09/30/2023, 10:59 AM  Clinical Narrative:      Insurance has offered a peer to peer to be completed by 12 pm.  MD provided with details      Expected Discharge Plan and Services                                               Social Determinants of Health (SDOH) Interventions SDOH Screenings   Food Insecurity: No Food Insecurity (09/24/2023)  Housing: Medium Risk (09/24/2023)  Transportation Needs: No Transportation Needs (09/24/2023)  Utilities: Not At Risk (09/24/2023)  Financial Resource Strain: Low Risk  (08/23/2023)   Received from [facility]  Tobacco Use: Medium Risk (09/23/2023)    Readmission Risk Interventions    09/24/2023   11:01 AM  Readmission Risk Prevention Plan  Post Dischage Appt Complete  Medication Screening Complete  Transportation Screening Complete

## 2023-10-01 DIAGNOSIS — T796XXA Traumatic ischemia of muscle, initial encounter: Secondary | ICD-10-CM | POA: Diagnosis not present

## 2023-10-01 LAB — GLUCOSE, CAPILLARY
Glucose-Capillary: 250 mg/dL — ABNORMAL HIGH (ref 70–99)
Glucose-Capillary: 353 mg/dL — ABNORMAL HIGH (ref 70–99)
Glucose-Capillary: 206 mg/dL — ABNORMAL HIGH (ref 70–99)
Glucose-Capillary: 271 mg/dL — ABNORMAL HIGH (ref 70–99)
Glucose-Capillary: 300 mg/dL — ABNORMAL HIGH (ref 70–99)

## 2023-10-01 MED ORDER — INSULIN GLARGINE-YFGN 100 UNIT/ML ~~LOC~~ SOLN
52.0000 [IU] | Freq: Every day | SUBCUTANEOUS | Status: DC
  Administered 2023-10-02 – 2023-10-04 (×3): 52 [IU] via SUBCUTANEOUS
  Filled 2023-10-01 (×3): qty 0.52

## 2023-10-01 MED ORDER — INSULIN ASPART 100 UNIT/ML IJ SOLN
10.0000 [IU] | Freq: Three times a day (TID) | INTRAMUSCULAR | Status: DC
  Administered 2023-10-01 – 2023-10-04 (×10): 10 [IU] via SUBCUTANEOUS
  Filled 2023-10-01 (×10): qty 1

## 2023-10-01 NOTE — Plan of Care (Signed)

## 2023-10-01 NOTE — Progress Notes (Signed)
Progress Note   Patient: Pamela Smith BJY:[id number] DOB: 17-Apr-1946 DOA: 09/23/2023     8 DOS: the patient was seen and examined on 10/01/2023   Brief hospital course: "Ms. Pamela Smith is a 77 year old female with history of insulin-dependent diabetes mellitus, hyperlipidemia, hypothyroid, hypertension, GERD, who presents emergency department for chief concerns of fall.  Patient found to have mild rhabdomyolysis, urinary tract infection and periorbital ecchymosis with no fractures or intracranial pathology."  See H&P for full HPI on admission & ED course.   Further hospital course and management as outlined below.  Medically stable awaiting SNF placement.    Assessment and Plan: Rhabdomyolysis -Mild  Status post sodium chloride 1 L bolus per EDP, followed by NS at 100 mL/h Encourage p.o. hydration CK improving 459 >> 317 >> 219 normalized 12/12 Resumed Lipitor   Periorbital ecchymosis, left, initial encounter CT scan of the brain did not show any orbital fractures or acute intracranial pathology With left forehead ecchymosis, present on admission Secondary to falling at home As needed ice to the area Continue as needed pain medication   Numbness of fingers of both hands likely in the setting of alcohol use vs carpal tunnel syndrome Patient reported that this started after the fall and that she has never had this before MRI of the brain without acute intracranial pathology CT C-spine was negative for acute injuries  Normal levels of folic acid and vitamin B12   Alcohol use Patient states she drinks several times per week, drinking wine and/or vodka Monitored on CIWA and scores have been zero. d/c'd CIWA protocol and Ativan   Urinary tract infection Completed 3 days ceftriaxone  No urine culture was sent Pt now asymptomatic   Type 2 diabetes mellitus with hyperglycemia, with long-term current use of insulin (HCC) CBG's above goal with several > 200 Holding  metformin Tradjenta (sub for home Januvia) continued Increased basal insulin 50>>52 units daily Increase scheduled Novolog 5 >> 7>>10 units TID + sliding scale  Goal inpatient blood glucose level is 140-180   Mixed hyperlipidemia Atorvastatin resumed, initially held due to rhabdo   Essential (primary) hypertension Continue metoprolol, felodipine Hydralazine 5 mg IV PRN   Hypothyroidism Continue levothyroxine 75 mcg daily before breakfast   GERD  Continue PPI  Mood disorder Continue home Wellbutrin  Vitamin B12 level 224, goal >400, started B12 1000 mcg p.o. daily.  Follow with PCP to repeat vitamin B12 level after 3 to 6 months.  Body mass index is 28.34 kg/m.  heparin injection 5,000 Units Start: 09/23/23 2200 Place TED hose Start: 09/23/23 2036     Code Status: Full code  Diet: Heart healthy  Family Communication: No one was available at bedside  Disposition Plan: SNF  Consults called: TOC     Subjective: No significant events overnight.  Patient was sitting comfortably on the recliner, denied any complaints. Patient is aware that insurance denied, she did a quick appeal, will hear the decision within 72 hours.    Physical Exam: General exam: awake, alert, no acute distress HEENT: improving left anterior forehead abrasion with no surrounding erythema or swelling, moist mucus membranes, hearing grossly normal  Respiratory system: normal respiratory effort on room air. Cardiovascular system: normal S1/S2, RRR, no pedal edema.   Gastrointestinal system: soft, NT, ND Central nervous system: A&O x 3. no gross focal neurologic deficits, normal speech Extremities: moves all, no edema, normal tone Psychiatry: normal mood, congruent affect, judgement and insight appear normal     Vitals:  09/30/23 2003 09/30/23 2218 10/01/23 0521 10/01/23 0738  BP: (!) 157/62 (!) 171/71 (!) 173/68 (!) 158/64  Pulse: 75 76 72 74  Resp: 18  17 16   Temp: 97.8 F (36.6 C)   98.1 F (36.7 C) 98.4 F (36.9 C)  TempSrc: Oral  Oral Oral  SpO2: 95% 96% 95% 95%  Weight:      Height:        Data Reviewed:      Latest Ref Rng & Units 09/30/2023   10:15 AM 09/25/2023    3:15 AM 09/24/2023    6:20 AM  CMP  Glucose 70 - 99 mg/dL 161  096  045   BUN 8 - 23 mg/dL 27  13  12    Creatinine 0.44 - 1.00 mg/dL 4.09  8.11  9.14   Sodium 135 - 145 mmol/L 133  134  137   Potassium 3.5 - 5.1 mmol/L 4.8  3.8  3.6   Chloride 98 - 111 mmol/L 101  102  103   CO2 22 - 32 mmol/L 22  23  24    Calcium 8.9 - 10.3 mg/dL 9.2  8.9  8.9        Latest Ref Rng & Units 09/30/2023   10:15 AM 09/28/2023    5:39 AM 09/26/2023    4:51 AM  CBC  WBC 4.0 - 10.5 K/uL 9.0  9.4  11.5   Hemoglobin 12.0 - 15.0 g/dL 78.2  95.6  21.3   Hematocrit 36.0 - 46.0 % 36.2  36.3  40.3   Platelets 150 - 400 K/uL 234  219  289      Family Communication: none present.  Pamela Smith updated at bedside on 12/13.  Disposition: Status is: Inpatient   Planned Discharge Destination: Needs SNF placement for rehab 12/16 peer-to-peer done, insurance denied Informed to [facility], awaiting for official denial and patient would probably appeal the discharge.   Time spent: 35 minutes  Author: Gillis Santa, MD 10/01/2023 3:01 PM  For on call review [email or web address].

## 2023-10-01 NOTE — Plan of Care (Signed)
  Problem: Education: Goal: Ability to describe self-care measures that may prevent or decrease complications (Diabetes Survival Skills Education) will improve Outcome: Progressing Goal: Individualized Educational Video(s) Outcome: Progressing   Problem: Coping: Goal: Ability to adjust to condition or change in health will improve Outcome: Progressing   

## 2023-10-01 NOTE — Progress Notes (Signed)
Mobility Specialist - Progress Note    10/01/23 1100  Mobility  Activity Ambulated with assistance in hallway;Stood at bedside  Level of Assistance Standby assist, set-up cues, supervision of patient - no hands on  Assistive Device Front wheel walker  Distance Ambulated (ft) 160 ft  Range of Motion/Exercises Active  Activity Response Tolerated well  Mobility Referral Yes  Mobility visit 1 Mobility   Pt resting in recliner on RA upon entry. Pt STS and ambulates to hallway around NS SBA with RW. Pt given vocal cuing to stop before to turning/slight turn to avoid LOB. Pt had bouts of instability during ambulation due to turns but recovered quickly. Pt returned to bed and left with needs in reach and bed alarm activated.   Johnathan Hausen Mobility Specialist 10/01/23, 11:46 AM

## 2023-10-01 NOTE — TOC Progression Note (Signed)
Transition of Care [facility]) - Progression Note    Patient Details  Name: Pamela Smith MRN: [id number] Date of Birth: 1946/03/21  Transition of Care [facility]) CM/SW Contact  Chapman Fitch, RN Phone Number: 10/01/2023, 9:19 AM  Clinical Narrative:     Received notification from [employer] that auth was denied for SNF after peer to peer  Met with patient at bedside and provided patient with information.  Patient request to file expedited appeal.  She is to call [phone] today to request expedited appeal       Expected Discharge Plan and Services                                               Social Determinants of Health (SDOH) Interventions SDOH Screenings   Food Insecurity: No Food Insecurity (09/24/2023)  Housing: Medium Risk (09/24/2023)  Transportation Needs: No Transportation Needs (09/24/2023)  Utilities: Not At Risk (09/24/2023)  Financial Resource Strain: Low Risk  (08/23/2023)   Received from [facility]  Tobacco Use: Medium Risk (09/23/2023)    Readmission Risk Interventions    09/24/2023   11:01 AM  Readmission Risk Prevention Plan  Post Dischage Appt Complete  Medication Screening Complete  Transportation Screening Complete

## 2023-10-01 NOTE — Inpatient Diabetes Management (Signed)
Inpatient Diabetes Program Recommendations  AACE/ADA: New Consensus Statement on Inpatient Glycemic Control   Target Ranges:  Prepandial:   less than 140 mg/dL      Peak postprandial:   less than 180 mg/dL (1-2 hours)      Critically ill patients:  140 - 180 mg/dL    Latest Reference Range & Units 09/30/23 07:45 09/30/23 11:13 09/30/23 17:02 09/30/23 21:18 10/01/23 07:39  Glucose-Capillary 70 - 99 mg/dL 161 (H) 096 (H) 045 (H) 349 (H) 250 (H)   Review of Glycemic Control  Diabetes history: DM2 Outpatient Diabetes medications: Lantus 50 units daily, Metformin 1000 mg BID, Januvia 100 mg daily Current orders for Inpatient glycemic control: Semglee 50 units daily, Novolog 0-15 units TID with meals, Novolog 0-5 units at bedtime, Novolog 7 units TID with meals, Tradjenta 5 mg daily   Inpatient Diabetes Program Recommendations:     Insulin: CBG 250 mg/dl this am.  Please consider increasing Semglee to 52 units daily and meal coverage to Novolog 10 units TID with meals.   Thanks, Orlando Penner, RN, MSN, CDCES Diabetes Coordinator Inpatient Diabetes Program [phone] (Team Pager from 8am to 5pm)

## 2023-10-02 DIAGNOSIS — T796XXA Traumatic ischemia of muscle, initial encounter: Secondary | ICD-10-CM | POA: Diagnosis not present

## 2023-10-02 LAB — PHOSPHORUS: Phosphorus: 4.3 mg/dL (ref 2.5–4.6)

## 2023-10-02 LAB — GLUCOSE, CAPILLARY
Glucose-Capillary: 236 mg/dL — ABNORMAL HIGH (ref 70–99)
Glucose-Capillary: 278 mg/dL — ABNORMAL HIGH (ref 70–99)
Glucose-Capillary: 203 mg/dL — ABNORMAL HIGH (ref 70–99)
Glucose-Capillary: 213 mg/dL — ABNORMAL HIGH (ref 70–99)

## 2023-10-02 LAB — CBC
HCT: 36.6 % (ref 36.0–46.0)
Hemoglobin: 12 g/dL (ref 12.0–15.0)
MCH: 29.5 pg (ref 26.0–34.0)
MCHC: 32.8 g/dL (ref 30.0–36.0)
MCV: 89.9 fL (ref 80.0–100.0)
Platelets: 284 10*3/uL (ref 150–400)
RBC: 4.07 MIL/uL (ref 3.87–5.11)
RDW: 13.6 % (ref 11.5–15.5)
WBC: 11.7 10*3/uL — ABNORMAL HIGH (ref 4.0–10.5)
nRBC: 0 % (ref 0.0–0.2)

## 2023-10-02 LAB — BASIC METABOLIC PANEL
Anion gap: 8 (ref 5–15)
BUN: 31 mg/dL — ABNORMAL HIGH (ref 8–23)
CO2: 22 mmol/L (ref 22–32)
Calcium: 9.7 mg/dL (ref 8.9–10.3)
Chloride: 104 mmol/L (ref 98–111)
Creatinine, Ser: 1.29 mg/dL — ABNORMAL HIGH (ref 0.44–1.00)
GFR, Estimated: 43 mL/min — ABNORMAL LOW (ref >60)
Glucose, Bld: 233 mg/dL — ABNORMAL HIGH (ref 70–99)
Potassium: 4.6 mmol/L (ref 3.5–5.1)
Sodium: 134 mmol/L — ABNORMAL LOW (ref 135–145)

## 2023-10-02 LAB — MAGNESIUM: Magnesium: 2 mg/dL (ref 1.7–2.4)

## 2023-10-02 MED ORDER — ISOSORBIDE MONONITRATE ER 30 MG PO TB24
60.0000 mg | ORAL_TABLET | Freq: Every day | ORAL | Status: DC
  Administered 2023-10-02 – 2023-10-04 (×3): 60 mg via ORAL
  Filled 2023-10-02 (×3): qty 2

## 2023-10-02 MED ORDER — HYDRALAZINE HCL 50 MG PO TABS: 50.0000 mg | ORAL_TABLET | Freq: Three times a day (TID) | ORAL | Status: DC | PRN

## 2023-10-02 NOTE — TOC Progression Note (Signed)
Transition of Care [facility]) - Progression Note    Patient Details  Name: Pamela Smith MRN: [id number] Date of Birth: 24-Jan-1946  Transition of Care [facility]) CM/SW Contact  Chapman Fitch, RN Phone Number: 10/02/2023, 10:23 AM  Clinical Narrative:      Sherron Monday with Alcario Drought at [facility] who confirms expedited appeal is in process.  Case ID [id number]  Clinical faxed to [phone]      Expected Discharge Plan and Services                                               Social Determinants of Health (SDOH) Interventions SDOH Screenings   Food Insecurity: No Food Insecurity (09/24/2023)  Housing: Medium Risk (09/24/2023)  Transportation Needs: No Transportation Needs (09/24/2023)  Utilities: Not At Risk (09/24/2023)  Financial Resource Strain: Low Risk  (08/23/2023)   Received from [facility]  Tobacco Use: Medium Risk (09/23/2023)    Readmission Risk Interventions    09/24/2023   11:01 AM  Readmission Risk Prevention Plan  Post Dischage Appt Complete  Medication Screening Complete  Transportation Screening Complete

## 2023-10-02 NOTE — Care Management Important Message (Signed)
Important Message  Patient Details  Name: Pamela Smith MRN: [id number] Date of Birth: 23-Aug-1946   Important Message Given:  Yes - Medicare IM     Dan Dissinger, Stephan Minister 10/02/2023, 2:40 PM

## 2023-10-02 NOTE — Progress Notes (Signed)
Progress Note   Patient: Pamela Smith:[id number] DOB: Oct 04, 1946 DOA: 09/23/2023     9 DOS: the patient was seen and examined on 10/02/2023   Brief hospital course: "Ms. Avalin Smith is a 77 year old female with history of insulin-dependent diabetes mellitus, hyperlipidemia, hypothyroid, hypertension, GERD, who presents emergency department for chief concerns of fall.  Patient found to have mild rhabdomyolysis, urinary tract infection and periorbital ecchymosis with no fractures or intracranial pathology."  See H&P for full HPI on admission & ED course.   Further hospital course and management as outlined below.  Medically stable awaiting SNF placement.    Assessment and Plan: Rhabdomyolysis -Mild  Status post sodium chloride 1 L bolus per EDP, followed by NS at 100 mL/h Encourage p.o. hydration CK improving 459 >> 317 >> 219 normalized 12/12 Resumed Lipitor   Periorbital ecchymosis, left, initial encounter CT scan of the brain did not show any orbital fractures or acute intracranial pathology With left forehead ecchymosis, present on admission Secondary to falling at home As needed ice to the area Continue as needed pain medication   Numbness of fingers of both hands likely in the setting of alcohol use vs carpal tunnel syndrome Patient reported that this started after the fall and that she has never had this before MRI of the brain without acute intracranial pathology CT C-spine was negative for acute injuries  Normal levels of folic acid and vitamin B12   Alcohol use Patient states she drinks several times per week, drinking wine and/or vodka Monitored on CIWA and scores have been zero. d/c'd CIWA protocol and Ativan   Urinary tract infection Completed 3 days ceftriaxone  No urine culture was sent Pt now asymptomatic   Type 2 diabetes mellitus with hyperglycemia, with long-term current use of insulin (HCC) CBG's above goal with several > 200 Holding  metformin Tradjenta (sub for home Januvia) continued Increased basal insulin 50>>52 units daily Increase scheduled Novolog 5 >> 7>>10 units TID + sliding scale  Goal inpatient blood glucose level is 140-180   Mixed hyperlipidemia Atorvastatin resumed, initially held due to rhabdo   Essential (primary) hypertension Continue metoprolol, felodipine Hydralazine 5 mg IV PRN   Hypothyroidism Continue levothyroxine 75 mcg daily before breakfast   GERD  Continue PPI  Mood disorder Continue home Wellbutrin  Vitamin B12 level 224, goal >400, started B12 1000 mcg p.o. daily.  Follow with PCP to repeat vitamin B12 level after 3 to 6 months.  Body mass index is 28.34 kg/m.  heparin injection 5,000 Units Start: 09/23/23 2200 Place TED hose Start: 09/23/23 2036     Code Status: Full code  Diet: Heart healthy  Family Communication: No one was available at bedside  Disposition Plan: SNF  Consults called: TOC     Subjective: No significant events overnight.  Patient was laying down in the bed, has not heard anything about her expedited appeal.  Denied any complaints. Patient did work with physical therapy, she feels off balance and needs physical therapy.  Physical Exam: General exam: awake, alert, no acute distress HEENT: improving left anterior forehead abrasion with no surrounding erythema or swelling, moist mucus membranes, hearing grossly normal  Respiratory system: normal respiratory effort on room air. Cardiovascular system: normal S1/S2, RRR, no pedal edema.   Gastrointestinal system: soft, NT, ND Central nervous system: A&O x 3. no gross focal neurologic deficits, normal speech Extremities: moves all, no edema, normal tone Psychiatry: normal mood, congruent affect, judgement and insight appear normal  Vitals:   10/02/23 0323 10/02/23 0422 10/02/23 0746 10/02/23 1018  BP: (!) 181/58 (!) 151/52 (!) 162/62 (!) 139/57  Pulse: 73  71 74  Resp: 16  18   Temp: 97.7  F (36.5 C)  97.6 F (36.4 C)   TempSrc: Oral  Oral   SpO2: 96%  96% 94%  Weight:      Height:        Data Reviewed:      Latest Ref Rng & Units 10/02/2023    6:02 AM 09/30/2023   10:15 AM 09/25/2023    3:15 AM  CMP  Glucose 70 - 99 mg/dL 176  160  737   BUN 8 - 23 mg/dL 31  27  13    Creatinine 0.44 - 1.00 mg/dL 1.06  2.69  4.85   Sodium 135 - 145 mmol/L 134  133  134   Potassium 3.5 - 5.1 mmol/L 4.6  4.8  3.8   Chloride 98 - 111 mmol/L 104  101  102   CO2 22 - 32 mmol/L 22  22  23    Calcium 8.9 - 10.3 mg/dL 9.7  9.2  8.9        Latest Ref Rng & Units 10/02/2023    6:02 AM 09/30/2023   10:15 AM 09/28/2023    5:39 AM  CBC  WBC 4.0 - 10.5 K/uL 11.7  9.0  9.4   Hemoglobin 12.0 - 15.0 g/dL 46.2  70.3  50.0   Hematocrit 36.0 - 46.0 % 36.6  36.2  36.3   Platelets 150 - 400 K/uL 284  234  219      Family Communication: none present.  Pamela Smith updated at bedside on 12/13.  Disposition: Status is: Inpatient   Planned Discharge Destination: Needs SNF placement for rehab 12/16 peer-to-peer done, insurance denied Informed to [facility], awaiting for official denial and patient would probably appeal the discharge. As per TOC expedited appeal is in process.    Time spent: 35 minutes  Author: Gillis Santa, MD 10/02/2023 3:59 PM  For on call review [email or web address].

## 2023-10-02 NOTE — Inpatient Diabetes Management (Signed)
Inpatient Diabetes Program Recommendations  AACE/ADA: New Consensus Statement on Inpatient Glycemic Control   Target Ranges:  Prepandial:   less than 140 mg/dL      Peak postprandial:   less than 180 mg/dL (1-2 hours)      Critically ill patients:  140 - 180 mg/dL    Latest Reference Range & Units 10/01/23 07:39 10/01/23 11:54 10/01/23 17:38 10/01/23 19:59 10/01/23 21:36 10/02/23 07:45  Glucose-Capillary 70 - 99 mg/dL 474 (H) 259 (H) 563 (H) 271 (H) 300 (H) 236 (H)   Review of Glycemic Control  Diabetes history: DM2 Outpatient Diabetes medications: Lantus 50 units daily, Metformin 1000 mg BID, Januvia 100 mg daily Current orders for Inpatient glycemic control: Semglee 52 units daily, Novolog 0-15 units TID with meals, Novolog 0-5 units at bedtime, Novolog 10 units TID with meals, Tradjenta 5 mg daily   Inpatient Diabetes Program Recommendations:     Insulin: CBG 236 mg/dl this am; noted Semglee 52 units ordered and given today (received 50 units on 12/17).  Please consider increasing meal coverage to Novolog 14 units TID with meals.   Thanks, Orlando Penner, RN, MSN, CDCES Diabetes Coordinator Inpatient Diabetes Program [phone] (Team Pager from 8am to 5pm)

## 2023-10-02 NOTE — Progress Notes (Signed)
Occupational Therapy Treatment Patient Details Name: BRAYAH HAENEL MRN: [id number] DOB: 1946-04-17 Today's Date: 10/02/2023   History of present illness Pt is a 77 year old female with history of insulin-dependent diabetes mellitus, hyperlipidemia, hypothyroid, hypertension, GERD, who presents emergency department for chief concerns of fall.  Work up showed mild rhabdomyolysis, urinary tract infection and periorbital ecchymosis.     Pt seen for OT treatment on this date. Upon arrival to room pt in bed, agreeable to OT Tx session. OT provides skilled education and recommendations for DME/AE for use in home to reduce risk of falls and hospital readmission. Pt reports feeling "wobbly", stating that she "almost" fell while walking with PT earlier, but therapist provided necessary level of physical assist to prevent. OT completes assessments as below to further assess safe return home at hospital discharge as pt notes she was previously independent in ADLs/IADLs/driving/mobility. No family or friends nearby to provide support.   Assessments completed:  OT comments  Barthel Index for Activities of Daily Living (ADL) = score 70/100 (minimally dependent) STEDI 30 Second Sit to Stand Test  = score 4 reps in 30 seconds; score under 10 indicates risk for falls based on norms for gender + age   Results of these assessment support increased risk for falls at home, and need for physical assist for safe, efficient ADL performance. Pt requires min-modA to correct posterior LOB while performing sit<>stands, presents with generalized weakness, decreased tolerance to activity and is unsafe to return home alone.   Pt is progressing toward OT goals and continues to benefit from skilled OT services to maximize return to PLOF and minimize risk of future falls, injury, caregiver burden, and readmission. Will continue to follow POC as written. Discharge recommendation remains appropriate. Patient will benefit from  continued inpatient follow up therapy, <3 hours/day.      If plan is discharge home, recommend the following:  A little help with walking and/or transfers;A little help with bathing/dressing/bathroom;Help with stairs or ramp for entrance;Assist for transportation;Assistance with cooking/housework   Equipment Recommendations  BSC/3in1;Tub/shower bench    Recommendations for Other Services      Precautions / Restrictions Precautions Precautions: Fall Restrictions Weight Bearing Restrictions Per Provider Order: No       Mobility Bed Mobility Overal bed mobility: Needs Assistance Bed Mobility: Supine to Sit, Sit to Supine     Supine to sit: Supervision          Transfers Overall transfer level: Needs assistance Equipment used: Rolling walker (2 wheels) Transfers: Sit to/from Stand, Bed to chair/wheelchair/BSC Sit to Stand: Min assist     Step pivot transfers: Min assist     General transfer comment: cues for hand placement and use of AD (pt initally wanting to transfer without AD, OT directs use of RW for safety as pt has demonstrated posterior LOB, requiring physical assist to correct     Balance Overall balance assessment: Needs assistance Sitting-balance support: Feet supported Sitting balance-Leahy Scale: Good     Standing balance support: Bilateral upper extremity supported Standing balance-Leahy Scale: Poor Standing balance comment: multiple imbalances with walker and static standing                           ADL either performed or assessed with clinical judgement   ADL Overall ADL's : Needs assistance/impaired  General ADL Comments: Pt provided items to brush teeth after lunch, declining need to toilet or perform other ADLs. Educated on falls prevention strategies and safe ADL performance in the home environment.      Cognition Arousal: Alert Behavior During Therapy: Anxious Overall  Cognitive Status: Within Functional Limits for tasks assessed                                          Exercises Exercises: Other exercises Other Exercises Other Exercises: Educated pt on DME/AE recommendations to improve safety in bathroom and reduce risk of falls. Other Exercises: 30 second sit to stand test performed; pt completes 4 reps in 30 second timeframe, requiring minA to correct imbalance x 2            Pertinent Vitals/ Pain       Pain Assessment Pain Assessment: 0-10 Pain Score: 3  Pain Location: bilat hands Pain Descriptors / Indicators: Discomfort, Numbness, Tingling Pain Intervention(s): Limited activity within patient's tolerance, Monitored during session, Repositioned   Frequency  Min 1X/week        Progress Toward Goals  OT Goals(current goals can now be found in the care plan section)  Progress towards OT goals: Progressing toward goals  Acute Rehab OT Goals OT Goal Formulation: With patient Time For Goal Achievement: 10/09/23 Potential to Achieve Goals: Fair ADL Goals Pt Will Perform Grooming: with modified independence;sitting;standing Pt Will Perform Lower Body Dressing: with modified independence;sit to/from stand Pt Will Transfer to Toilet: with modified independence;ambulating Pt Will Perform Toileting - Clothing Manipulation and hygiene: with modified independence;sit to/from stand  Plan         AM-PAC OT "6 Clicks" Daily Activity     Outcome Measure   Help from another person eating meals?: None Help from another person taking care of personal grooming?: A Little Help from another person toileting, which includes using toliet, bedpan, or urinal?: A Little Help from another person bathing (including washing, rinsing, drying)?: A Little Help from another person to put on and taking off regular upper body clothing?: A Little Help from another person to put on and taking off regular lower body clothing?: A Little 6 Click  Score: 19    End of Session Equipment Utilized During Treatment: Gait belt;Rolling walker (2 wheels)  OT Visit Diagnosis: Other abnormalities of gait and mobility (R26.89)   Activity Tolerance Patient limited by fatigue   Patient Left in chair;with call bell/phone within reach;with chair alarm set;with SCD's reapplied   Nurse Communication Mobility status        Time: 4627-0350 OT Time Calculation (min): 26 min  Charges: OT General Charges $OT Visit: 1 Visit OT Treatments $Self Care/Home Management : 23-37 mins  Laketta Soderberg L. Angalina Ante, OTR/L  10/02/23, 12:48 PM

## 2023-10-02 NOTE — Progress Notes (Signed)
Physical Therapy Treatment Patient Details Name: Pamela Smith MRN: [id number] DOB: 05-06-1946 Today's Date: 10/02/2023   History of Present Illness Pt is a 77 year old female with history of insulin-dependent diabetes mellitus, hyperlipidemia, hypothyroid, hypertension, GERD, who presents emergency department for chief concerns of fall.  Work up showed mild rhabdomyolysis, urinary tract infection and periorbital ecchymosis.    PT Comments  Ready for session.  She is able to enter/exit bed with supervision and is generally steady in sitting.  She walks x 1 lap on unit with RW and close cag/min a and occasionally mod a x 1 to correct imbalances.  She has x 4 times where falls would have been likely without intervention.  Gait remains with varied speed and occasionally she seems to stomp her foot while walking.  When questioned she is unsure why she does this.  She voices she feels she would do better with shoes on but stated her only shoes here are sneakers that "feel like bricks" when she puts them on.  She needs general cues for hand placements with transfers and to step up into walker.  Berg balance test is given with score 10/56 indicating very high fall risk.  She attempts some tasks but when asked on others she states "I can't do that!" And will sit back down.    Per TOC noted, insurance has denied rehab.  Pt lives alone with no outside supports.  Family lives hours away and she has no children to help.  She was independent prior to admission and not requires significant assist for LOB's.  Concern for balance and falls at home are supported by performance in facility and BERG score.  Recommendations for <3 hours of therapy at discharge remains.    If plan is discharge home, recommend the following: A lot of help with walking and/or transfers;A lot of help with bathing/dressing/bathroom;Assist for transportation;Assistance with cooking/housework;Help with stairs or ramp for entrance   Can travel  by private vehicle        Equipment Recommendations       Recommendations for Other Services       Precautions / Restrictions Precautions Precautions: Fall Restrictions Weight Bearing Restrictions Per Provider Order: No     Mobility  Bed Mobility Overal bed mobility: Modified Independent               Patient Response: Cooperative  Transfers Overall transfer level: Needs assistance Equipment used: Rolling walker (2 wheels) Transfers: Sit to/from Stand Sit to Stand: Min assist                Ambulation/Gait Ambulation/Gait assistance: Min assist, Mod assist Gait Distance (Feet): 200 Feet Assistive device: Rolling walker (2 wheels) Gait Pattern/deviations: Step-through pattern, Trunk flexed Gait velocity: dec     General Gait Details: several imbalances that likely would have resulted in fall without Clinical research associate intervention.  unsafe to walk unassisted   Stairs             Wheelchair Mobility     Tilt Bed Tilt Bed Patient Response: Cooperative  Modified Rankin (Stroke Patients Only)       Balance Overall balance assessment: Needs assistance Sitting-balance support: Feet supported Sitting balance-Leahy Scale: Good     Standing balance support: Bilateral upper extremity supported Standing balance-Leahy Scale: Poor Standing balance comment: multiple imbalances with walker and static standing                 Standardized Balance Assessment Standardized Balance Assessment : Berg Balance Test  Berg Balance Test Sit to Stand: Able to stand using hands after several tries Standing Unsupported: Unable to stand 30 seconds unassisted Sitting with Back Unsupported but Feet Supported on Floor or Stool: Able to sit safely and securely 2 minutes Stand to Sit: Uses backs of legs against chair to control descent Transfers: Needs one person to assist Standing Unsupported with Eyes Closed: Needs help to keep from falling Standing Ubsupported with  Feet Together: Needs help to attain position and unable to hold for 15 seconds From Standing, Reach Forward with Outstretched Arm: Reaches forward but needs supervision From Standing Position, Pick up Object from Floor: Unable to try/needs assist to keep balance From Standing Position, Turn to Look Behind Over each Shoulder: Needs assist to keep from losing balance and falling Turn 360 Degrees: Needs assistance while turning Standing Unsupported, Alternately Place Feet on Step/Stool: Needs assistance to keep from falling or unable to try Standing Unsupported, One Foot in Front: Loses balance while stepping or standing Standing on One Leg: Unable to try or needs assist to prevent fall Total Score: 10        Cognition Arousal: Alert Behavior During Therapy: Anxious Overall Cognitive Status: Within Functional Limits for tasks assessed                                          Exercises      General Comments        Pertinent Vitals/Pain Pain Assessment Pain Assessment: No/denies pain    Home Living                          Prior Function            PT Goals (current goals can now be found in the care plan section) Progress towards PT goals: Progressing toward goals    Frequency    Min 1X/week      PT Plan      Co-evaluation              AM-PAC PT "6 Clicks" Mobility   Outcome Measure  Help needed turning from your back to your side while in a flat bed without using bedrails?: None Help needed moving from lying on your back to sitting on the side of a flat bed without using bedrails?: None Help needed moving to and from a bed to a chair (including a wheelchair)?: A Little Help needed standing up from a chair using your arms (e.g., wheelchair or bedside chair)?: A Little Help needed to walk in hospital room?: A Lot Help needed climbing 3-5 steps with a railing? : A Lot 6 Click Score: 18    End of Session Equipment Utilized  During Treatment: Gait belt Activity Tolerance: Patient limited by fatigue Patient left: in bed;with call bell/phone within reach;with bed alarm set Nurse Communication: Mobility status PT Visit Diagnosis: Other abnormalities of gait and mobility (R26.89);Muscle weakness (generalized) (M62.81);Difficulty in walking, not elsewhere classified (R26.2)     Time: 9562-1308 PT Time Calculation (min) (ACUTE ONLY): 11 min  Charges:    $Gait Training: 8-22 mins PT General Charges $$ ACUTE PT VISIT: 1 Visit                   Danielle Dess, PTA 10/02/23, 9:53 AM

## 2023-10-03 DIAGNOSIS — T796XXA Traumatic ischemia of muscle, initial encounter: Secondary | ICD-10-CM | POA: Diagnosis not present

## 2023-10-03 LAB — CBC
HCT: 35.6 % — ABNORMAL LOW (ref 36.0–46.0)
Hemoglobin: 11.5 g/dL — ABNORMAL LOW (ref 12.0–15.0)
MCH: 29.6 pg (ref 26.0–34.0)
MCHC: 32.3 g/dL (ref 30.0–36.0)
MCV: 91.8 fL (ref 80.0–100.0)
Platelets: 283 10*3/uL (ref 150–400)
RBC: 3.88 MIL/uL (ref 3.87–5.11)
RDW: 13.9 % (ref 11.5–15.5)
WBC: 10.8 10*3/uL — ABNORMAL HIGH (ref 4.0–10.5)
nRBC: 0 % (ref 0.0–0.2)

## 2023-10-03 LAB — GLUCOSE, CAPILLARY
Glucose-Capillary: 171 mg/dL — ABNORMAL HIGH (ref 70–99)
Glucose-Capillary: 221 mg/dL — ABNORMAL HIGH (ref 70–99)
Glucose-Capillary: 191 mg/dL — ABNORMAL HIGH (ref 70–99)
Glucose-Capillary: 217 mg/dL — ABNORMAL HIGH (ref 70–99)

## 2023-10-03 LAB — BASIC METABOLIC PANEL
Anion gap: 9 (ref 5–15)
BUN: 36 mg/dL — ABNORMAL HIGH (ref 8–23)
CO2: 22 mmol/L (ref 22–32)
Calcium: 9.7 mg/dL (ref 8.9–10.3)
Chloride: 104 mmol/L (ref 98–111)
Creatinine, Ser: 1.61 mg/dL — ABNORMAL HIGH (ref 0.44–1.00)
GFR, Estimated: 33 mL/min — ABNORMAL LOW (ref >60)
Glucose, Bld: 170 mg/dL — ABNORMAL HIGH (ref 70–99)
Potassium: 4.5 mmol/L (ref 3.5–5.1)
Sodium: 135 mmol/L (ref 135–145)

## 2023-10-03 LAB — MAGNESIUM: Magnesium: 2.1 mg/dL (ref 1.7–2.4)

## 2023-10-03 LAB — PHOSPHORUS: Phosphorus: 4.7 mg/dL — ABNORMAL HIGH (ref 2.5–4.6)

## 2023-10-03 MED ORDER — ACETAMINOPHEN 325 MG PO TABS
650.0000 mg | ORAL_TABLET | Freq: Four times a day (QID) | ORAL | Status: DC | PRN
  Administered 2023-10-03 (×2): 650 mg via ORAL
  Filled 2023-10-03 (×2): qty 2

## 2023-10-03 MED ORDER — SODIUM CHLORIDE 0.9 % IV SOLN: INTRAVENOUS | Status: DC

## 2023-10-03 MED ORDER — OXYCODONE HCL 5 MG PO TABS: 5.0000 mg | ORAL_TABLET | Freq: Four times a day (QID) | ORAL | Status: DC | PRN

## 2023-10-03 NOTE — Inpatient Diabetes Management (Signed)
Inpatient Diabetes Program Recommendations  AACE/ADA: New Consensus Statement on Inpatient Glycemic Control   Target Ranges:  Prepandial:   less than 140 mg/dL      Peak postprandial:   less than 180 mg/dL (1-2 hours)      Critically ill patients:  140 - 180 mg/dL    Latest Reference Range & Units 10/02/23 07:45 10/02/23 11:55 10/02/23 16:24 10/02/23 21:17 10/03/23 08:18 10/03/23 11:13  Glucose-Capillary 70 - 99 mg/dL 782 (H) 956 (H) 213 (H) 213 (H) 171 (H) 221 (H)   Review of Glycemic Control  Outpatient Diabetes medications: Lantus 50 units daily, Metformin 1000 mg BID, Januvia 100 mg daily Current orders for Inpatient glycemic control: Semglee 52 units daily, Novolog 0-15 units TID with meals, Novolog 0-5 units at bedtime, Novolog 10 units TID with meals, Tradjenta 5 mg daily   Inpatient Diabetes Program Recommendations:     Insulin: Please consider increasing meal coverage to Novolog 13 units TID with meals.  Thanks, Orlando Penner, RN, MSN, CDCES Diabetes Coordinator Inpatient Diabetes Program [phone] (Team Pager from 8am to 5pm)

## 2023-10-03 NOTE — Progress Notes (Addendum)
PT Cancellation Note  Patient Details Name: Pamela Smith MRN: [id number] DOB: [birth year]   Cancelled Treatment:     PT attempt. Pt just returned to bed from going to [facility]." I have a really bad headache right now. Can you come back later?" Acute PT will continue to follow + progress per current POC.    Rushie Chestnut 10/03/2023, 1:38 PM

## 2023-10-03 NOTE — TOC Progression Note (Signed)
Transition of Care [facility]) - Progression Note    Patient Details  Name: XYLA KNITTER MRN: [id number] Date of Birth: 12/26/1945  [facility]) CM/SW Contact  Chapman Fitch, RN Phone Number: 10/03/2023, 1:47 PM  Clinical Narrative:          Notified by Talbot Grumbling that appeal was overturned and patient has been approved for SNF.  Per MD cr elevated today, anticipated dc tomorrow.  Kenney Houseman at [facility] notified and confirms they can admit tomorrow   Expected Discharge Plan and Services                                               Social Determinants of Health (SDOH) Interventions SDOH Screenings   Food Insecurity: No Food Insecurity (09/24/2023)  Housing: Medium Risk (09/24/2023)  Transportation Needs: No Transportation Needs (09/24/2023)  Utilities: Not At Risk (09/24/2023)  Financial Resource Strain: Low Risk  (08/23/2023)   Received from [facility]  Tobacco Use: Medium Risk (09/23/2023)    Readmission Risk Interventions    09/24/2023   11:01 AM  Readmission Risk Prevention Plan  Post Dischage Appt Complete  Medication Screening Complete  Transportation Screening Complete

## 2023-10-03 NOTE — Plan of Care (Signed)
  Problem: Coping: Goal: Ability to adjust to condition or change in health will improve Outcome: Progressing   Problem: Metabolic: Goal: Ability to maintain appropriate glucose levels will improve Outcome: Progressing   Problem: Nutritional: Goal: Maintenance of adequate nutrition will improve Outcome: Progressing   Problem: Skin Integrity: Goal: Risk for impaired skin integrity will decrease Outcome: Progressing   

## 2023-10-03 NOTE — TOC Progression Note (Signed)
Transition of Care [facility]) - Progression Note    Patient Details  Name: Pamela Smith MRN: [id number] Date of Birth: 11/16/1945  Transition of Care [facility]) CM/SW Contact  Chapman Fitch, RN Phone Number: 10/03/2023, 11:29 AM  Clinical Narrative:      Expedited appeal pending      Expected Discharge Plan and Services                                               Social Determinants of Health (SDOH) Interventions SDOH Screenings   Food Insecurity: No Food Insecurity (09/24/2023)  Housing: Medium Risk (09/24/2023)  Transportation Needs: No Transportation Needs (09/24/2023)  Utilities: Not At Risk (09/24/2023)  Financial Resource Strain: Low Risk  (08/23/2023)   Received from [facility]  Tobacco Use: Medium Risk (09/23/2023)    Readmission Risk Interventions    09/24/2023   11:01 AM  Readmission Risk Prevention Plan  Post Dischage Appt Complete  Medication Screening Complete  Transportation Screening Complete

## 2023-10-03 NOTE — Plan of Care (Signed)
  Problem: Education: Goal: Ability to describe self-care measures that may prevent or decrease complications (Diabetes Survival Skills Education) will improve Outcome: Progressing Goal: Individualized Educational Video(s) Outcome: Progressing   Problem: Coping: Goal: Ability to adjust to condition or change in health will improve Outcome: Progressing   Problem: Fluid Volume: Goal: Ability to maintain a balanced intake and output will improve Outcome: Progressing   Problem: Health Behavior/Discharge Planning: Goal: Ability to identify and utilize available resources and services will improve Outcome: Progressing Goal: Ability to manage health-related needs will improve Outcome: Progressing   Problem: Metabolic: Goal: Ability to maintain appropriate glucose levels will improve Outcome: Progressing   Problem: Nutritional: Goal: Maintenance of adequate nutrition will improve Outcome: Progressing Goal: Progress toward achieving an optimal weight will improve Outcome: Progressing   Problem: Skin Integrity: Goal: Risk for impaired skin integrity will decrease Outcome: Progressing   Problem: Education: Goal: Knowledge of General Education information will improve Description: Including pain rating scale, medication(s)/side effects and non-pharmacologic comfort measures Outcome: Progressing   Problem: Health Behavior/Discharge Planning: Goal: Ability to manage health-related needs will improve Outcome: Progressing   Problem: Clinical Measurements: Goal: Ability to maintain clinical measurements within normal limits will improve Outcome: Progressing Goal: Will remain free from infection Outcome: Progressing Goal: Diagnostic test results will improve Outcome: Progressing Goal: Respiratory complications will improve Outcome: Progressing Goal: Cardiovascular complication will be avoided Outcome: Progressing   Problem: Nutrition: Goal: Adequate nutrition will be  maintained Outcome: Progressing   Problem: Activity: Goal: Risk for activity intolerance will decrease Outcome: Progressing   Problem: Coping: Goal: Level of anxiety will decrease Outcome: Progressing   Problem: Pain Management: Goal: General experience of comfort will improve Outcome: Progressing

## 2023-10-03 NOTE — Progress Notes (Signed)
Progress Note   Patient: Pamela Smith ZOX:[id number] DOB: [birth year] DOA: 09/23/2023     10 DOS: the patient was seen and examined on 10/03/2023   Brief hospital course: "Ms. Raegen Ton is a 77 year old female with history of insulin-dependent diabetes mellitus, hyperlipidemia, hypothyroid, hypertension, GERD, who presents emergency department for chief concerns of fall.  Patient found to have mild rhabdomyolysis, urinary tract infection and periorbital ecchymosis with no fractures or intracranial pathology."  See H&P for full HPI on admission & ED course.   Further hospital course and management as outlined below.  Medically stable awaiting SNF placement.    Assessment and Plan:  # Rhabdomyolysis -Mild  Status post sodium chloride 1 L bolus per EDP, followed by NS at 100 mL/h Encourage p.o. hydration CK improving 459 >> 317 >> 219 normalized 12/12 Resumed Lipitor   # Aki on CKD stage IIIa, most likely due to dehydration Creatinine slightly elevated 12/19 started NS 75 mL/h overnight for hydration Avoid nephrotoxic medication, use renally dose medications Monitor urine output Repeat BMP tomorrow a.m.  # Periorbital ecchymosis, left, initial encounter CT scan of the brain did not show any orbital fractures or acute intracranial pathology With left forehead ecchymosis, present on admission Secondary to falling at home As needed ice to the area Continue as needed pain medication   # Numbness of fingers of both hands likely in the setting of alcohol use vs carpal tunnel syndrome Patient reported that this started after the fall and that she has never had this before MRI of the brain without acute intracranial pathology CT C-spine was negative for acute injuries  Normal levels of folic acid and vitamin B12   # Alcohol use Patient states she drinks several times per week, drinking wine and/or vodka Monitored on CIWA and scores have been zero. d/c'd CIWA protocol and  Ativan   # Urinary tract infection Completed 3 days ceftriaxone  No urine culture was sent Pt now asymptomatic   # Type 2 diabetes mellitus with hyperglycemia, with long-term current use of insulin (HCC) CBG's above goal with several > 200 Holding metformin Tradjenta (sub for home Januvia) continued Increased basal insulin 50>>52 units daily Increase scheduled Novolog 5 >> 7>>10 units TID + sliding scale  Goal inpatient blood glucose level is 140-180   # Mixed hyperlipidemia Atorvastatin resumed, initially held due to rhabdo   # Essential (primary) hypertension Continue metoprolol, felodipine Hydralazine 5 mg IV PRN   # Hypothyroidism Continue levothyroxine 75 mcg daily before breakfast   # GERD  Continue PPI  # Mood disorder Continue home Wellbutrin  # Vitamin B12 level 224, goal >400, started B12 1000 mcg p.o. daily.  Follow with PCP to repeat vitamin B12 level after 3 to 6 months.  Body mass index is 28.34 kg/m.  heparin injection 5,000 Units Start: 09/23/23 2200 Place TED hose Start: 09/23/23 2036    Code Status: Full code  Diet: Heart healthy  Family Communication: No one was available at bedside  Disposition Plan: SNF  Consults called: TOC     Subjective: No significant events overnight.  Patient had a headache in the morning time, blood pressure slightly elevated could be due to headache versus high blood pressure causing headache.  Patient was advised to take Tylenol and we will continue to monitor BP.  Oral hydration was encouraged, patient may not be drinking enough water as her creatinine is elevated. Started IV fluid for hydration, we will continue to monitor today. Insurance approved patient's appeal  for SNF placement. We will plan to discharge her tomorrow morning  Physical Exam: General exam: awake, alert, no acute distress HEENT: improving left anterior forehead abrasion with no surrounding erythema or swelling, moist mucus membranes, hearing  grossly normal  Respiratory system: normal respiratory effort on room air. Cardiovascular system: normal S1/S2, RRR, no pedal edema.   Gastrointestinal system: soft, NT, ND Central nervous system: A&O x 3. no gross focal neurologic deficits, normal speech Extremities: moves all, no edema, normal tone Psychiatry: normal mood, congruent affect, judgement and insight appear normal     Vitals:   10/02/23 1951 10/03/23 0315 10/03/23 0817 10/03/23 1102  BP: (!) 135/55 (!) 153/65 (!) 180/76 (!) 150/69  Pulse: 77 74 74 76  Resp: 18 16 16    Temp: 98.2 F (36.8 C) 97.8 F (36.6 C) 97.9 F (36.6 C)   TempSrc: Oral Oral    SpO2: 95% 95% 95%   Weight:      Height:        Data Reviewed:      Latest Ref Rng & Units 10/03/2023    5:32 AM 10/02/2023    6:02 AM 09/30/2023   10:15 AM  CMP  Glucose 70 - 99 mg/dL 409  811  914   BUN 8 - 23 mg/dL 36  31  27   Creatinine 0.44 - 1.00 mg/dL 7.82  9.56  2.13   Sodium 135 - 145 mmol/L 135  134  133   Potassium 3.5 - 5.1 mmol/L 4.5  4.6  4.8   Chloride 98 - 111 mmol/L 104  104  101   CO2 22 - 32 mmol/L 22  22  22    Calcium 8.9 - 10.3 mg/dL 9.7  9.7  9.2        Latest Ref Rng & Units 10/03/2023    5:32 AM 10/02/2023    6:02 AM 09/30/2023   10:15 AM  CBC  WBC 4.0 - 10.5 K/uL 10.8  11.7  9.0   Hemoglobin 12.0 - 15.0 g/dL 08.6  57.8  46.9   Hematocrit 36.0 - 46.0 % 35.6  36.6  36.2   Platelets 150 - 400 K/uL 283  284  234      Family Communication: none present.  Erskine Squibb updated at bedside on 12/13.  Disposition: Status is: Inpatient   Planned Discharge Destination: Needs SNF placement for rehab 12/16 peer-to-peer done, insurance denied Patient did not presented appeal which got approved by insurance company for SNF placement. Due to elevated creatinine and elevated BP, we will continue to monitor today and started IV fluid for hydration.  Repeat BMP tomorrow a.m., discharge planning tomorrow a.m.  Time spent: 40  minutes  Author: Gillis Santa, MD 10/03/2023 2:52 PM  For on call review [email or web address].

## 2023-10-04 DIAGNOSIS — T796XXA Traumatic ischemia of muscle, initial encounter: Secondary | ICD-10-CM | POA: Diagnosis not present

## 2023-10-04 LAB — BASIC METABOLIC PANEL
Anion gap: 10 (ref 5–15)
BUN: 29 mg/dL — ABNORMAL HIGH (ref 8–23)
CO2: 23 mmol/L (ref 22–32)
Calcium: 9.4 mg/dL (ref 8.9–10.3)
Chloride: 105 mmol/L (ref 98–111)
Creatinine, Ser: 1.13 mg/dL — ABNORMAL HIGH (ref 0.44–1.00)
GFR, Estimated: 50 mL/min — ABNORMAL LOW (ref >60)
Glucose, Bld: 174 mg/dL — ABNORMAL HIGH (ref 70–99)
Potassium: 4.3 mmol/L (ref 3.5–5.1)
Sodium: 138 mmol/L (ref 135–145)

## 2023-10-04 LAB — CBC
HCT: 33.2 % — ABNORMAL LOW (ref 36.0–46.0)
Hemoglobin: 10.9 g/dL — ABNORMAL LOW (ref 12.0–15.0)
MCH: 29.7 pg (ref 26.0–34.0)
MCHC: 32.8 g/dL (ref 30.0–36.0)
MCV: 90.5 fL (ref 80.0–100.0)
Platelets: 243 10*3/uL (ref 150–400)
RBC: 3.67 MIL/uL — ABNORMAL LOW (ref 3.87–5.11)
RDW: 13.9 % (ref 11.5–15.5)
WBC: 11.5 10*3/uL — ABNORMAL HIGH (ref 4.0–10.5)
nRBC: 0 % (ref 0.0–0.2)

## 2023-10-04 LAB — GLUCOSE, CAPILLARY
Glucose-Capillary: 162 mg/dL — ABNORMAL HIGH (ref 70–99)
Glucose-Capillary: 170 mg/dL — ABNORMAL HIGH (ref 70–99)
Glucose-Capillary: 156 mg/dL — ABNORMAL HIGH (ref 70–99)

## 2023-10-04 LAB — CK: Total CK: 138 U/L (ref 38–234)

## 2023-10-04 LAB — MAGNESIUM: Magnesium: 1.9 mg/dL (ref 1.7–2.4)

## 2023-10-04 LAB — PHOSPHORUS: Phosphorus: 3.8 mg/dL (ref 2.5–4.6)

## 2023-10-04 MED ORDER — VITAMIN B-1 100 MG PO TABS: 100.0000 mg | ORAL_TABLET | Freq: Every day | ORAL | Status: AC

## 2023-10-04 MED ORDER — CYANOCOBALAMIN 1000 MCG PO TABS: 1000.0000 ug | ORAL_TABLET | Freq: Every day | ORAL | Status: AC

## 2023-10-04 MED ORDER — ISOSORBIDE MONONITRATE ER 60 MG PO TB24: 60.0000 mg | ORAL_TABLET | Freq: Every day | ORAL | Status: DC

## 2023-10-04 MED ORDER — FELODIPINE ER 10 MG PO TB24: 10.0000 mg | ORAL_TABLET | Freq: Every day | ORAL | Status: DC

## 2023-10-04 MED ORDER — FELODIPINE ER 10 MG PO TB24: 10.0000 mg | ORAL_TABLET | Freq: Every day | ORAL | Status: AC

## 2023-10-04 MED ORDER — HYDRALAZINE HCL 50 MG PO TABS: 50.0000 mg | ORAL_TABLET | Freq: Three times a day (TID) | ORAL | Status: DC | PRN

## 2023-10-04 NOTE — TOC Transition Note (Signed)
Transition of Care [facility]) - Discharge Note   Patient Details  Name: Pamela Smith MRN: [id number] Date of Birth: 1946-07-14  Transition of Care [facility]) CM/SW Contact:  Chapman Fitch, RN Phone Number: 10/04/2023, 1:58 PM   Clinical Narrative:     Patient will DC to: [facility]  Anticipated DC date: 10/04/23  Family notified: Patient to update family Transport by: Wendie Simmer  Per MD patient ready for DC to . RN, patient, and facility notified of DC. Discharge Summary sent to facility. RN given number for report. DC packet on chart. Ambulance transport requested for patient.  TOC signing off.          Patient Goals and CMS Choice            Discharge Placement                       Discharge Plan and Services Additional resources added to the After Visit Summary for                                       Social Drivers of Health (SDOH) Interventions SDOH Screenings   Food Insecurity: No Food Insecurity (09/24/2023)  Housing: Medium Risk (09/24/2023)  Transportation Needs: No Transportation Needs (09/24/2023)  Utilities: Not At Risk (09/24/2023)  Financial Resource Strain: Low Risk  (08/23/2023)   Received from [facility]  Tobacco Use: Medium Risk (09/23/2023)     Readmission Risk Interventions    09/24/2023   11:01 AM  Readmission Risk Prevention Plan  Post Dischage Appt Complete  Medication Screening Complete  Transportation Screening Complete

## 2023-10-04 NOTE — Plan of Care (Signed)

## 2023-10-04 NOTE — TOC Progression Note (Signed)
Transition of Care [facility]) - Progression Note    Patient Details  Name: Pamela Smith MRN: [id number] Date of Birth: [birth year]  Transition of Care [facility]) CM/SW Contact  Chapman Fitch, RN Phone Number: 10/04/2023, 11:39 AM  Clinical Narrative:      Message sent to MD to determine if patient is medically stable for discharge Patient states that she will need [facility].  EMS packet on chart      Expected Discharge Plan and Services                                               Social Determinants of Health (SDOH) Interventions SDOH Screenings   Food Insecurity: No Food Insecurity (09/24/2023)  Housing: Medium Risk (09/24/2023)  Transportation Needs: No Transportation Needs (09/24/2023)  Utilities: Not At Risk (09/24/2023)  Financial Resource Strain: Low Risk  (08/23/2023)   Received from [facility]  Tobacco Use: Medium Risk (09/23/2023)    Readmission Risk Interventions    09/24/2023   11:01 AM  Readmission Risk Prevention Plan  Post Dischage Appt Complete  Medication Screening Complete  Transportation Screening Complete

## 2023-10-04 NOTE — Discharge Summary (Signed)
[facility] Discharge Summary   Patient: Pamela Smith BJY:[id number]  PCP: Barbette Reichmann, MD  Date of admission: 09/23/2023   Date of discharge:  10/04/2023     Discharge Diagnoses:  Principal Problem:   Rhabdomyolysis Active Problems:   Hypothyroidism   Essential (primary) hypertension   Mixed hyperlipidemia   Type 2 diabetes mellitus with hyperglycemia, with long-term current use of insulin (HCC)   Pyuria   Alcohol use   Numbness of fingers of both hands   Periorbital ecchymosis, left, initial encounter   Admitted From: Home Disposition:  SNF   Recommendations for Outpatient Follow-up:  Follow-up with PCP, patient should be seen by an MD in 1 to 2 days, continue to monitor BP and titrate medication accordingly Follow up LABS/TEST:     Contact information for after-discharge care     Destination     [facility] .   Service: Skilled Nursing Contact information: [address] [place] [address] [phone]                    Diet recommendation: Cardiac diet  Activity: The patient is advised to gradually reintroduce usual activities, as tolerated  Discharge Condition: stable  Code Status: Full code   History of present illness: As per the H and P dictated on admission. Hospital Course:  "Ms. Pamela Smith is a 77 year old female with history of insulin-dependent diabetes mellitus, hyperlipidemia, hypothyroid, hypertension, GERD, who presents emergency department for chief concerns of fall.  Patient found to have mild rhabdomyolysis, urinary tract infection and periorbital ecchymosis with no fractures or intracranial pathology."  See H&P for full HPI on admission & ED course. Further hospital course and management as outlined below. Medically stable awaiting SNF placement.   Assessment and Plan:   # Rhabdomyolysis -Mild, Status post sodium chloride 1 L bolus per EDP, followed by NS at 100 mL/h. Encourage p.o.  hydration. CK improving 459 >> 317 >> 219 normalized 12/12 Resumed Lipitor  # Aki on CKD stage IIIa, most likely due to dehydration.  Creatinine slightly elevated, on 12/19 started NS 75 mL/h overnight for hydration Avoid nephrotoxic medication. Cr 1.13 today improved.  Continue oral hydration # Periorbital ecchymosis, left, initial encounter. Resolved  CT scan of the brain did not show any orbital fractures or acute intracranial pathology With left forehead ecchymosis, present on admission. Secondary to falling at home # Numbness of fingers of both hands likely in the setting of alcohol use vs carpal tunnel syndrome. Patient reported that this started after the fall and that she has never had this before MRI of the brain without acute intracranial pathology. CT C-spine was negative for acute injuries  Normal levels of folic acid and vitamin B12 level 224 at lower end, goal >400.  Started oral supplement. # Alcohol use: Patient states she drinks several times per week, drinking wine and/or vodka Monitored on CIWA and scores have been zero. d/c'd CIWA protocol and Ativan.  Continue thiamine for 30 days. # Urinary tract infection: Completed 3 days ceftriaxone. No urine culture was sent Pt now asymptomatic # Type 2 diabetes mellitus with hyperglycemia, with long-term current use of insulin During hospital stay patient was given basal insulin and NovoLog sliding scale.  Resumed home dose basal insulin, and resumed oral meds metformin and Januvia.  Continue diabetic diet, monitor CBG and titrate medication accordingly. # Mixed hyperlipidemia: Atorvastatin resumed, initially held due to rhabdo # Essential (primary) hypertension: Blood pressure elevated, medications adjusted below.  Continue metoprolol 25 mg p.o. twice daily.encouraged felodipine from 5 to 10 mg p.o. daily, started Imdur 60 mg p.o. daily and continue hydralazine 50 mg p.o. every 8 hourly prn SBP >150 mmHg # Hypothyroidism: Continue  levothyroxine 75 mcg daily before breakfast  # GERD: Continue PPI # Mood disorder: Continue home Wellbutrin # Vitamin B12 level 224, goal >400, started B12 1000 mcg p.o. daily.  Follow with PCP to repeat vitamin B12 level after 3 to 6 months.  Body mass index is 28.34 kg/m.  Nutrition Interventions:  - Patient was instructed, not to drive, operate heavy machinery, perform activities at heights, swimming or participation in water activities or provide baby sitting services while on Pain, Sleep and Anxiety Medications; until her outpatient Physician has advised to do so again.  - Also recommended to not to take more than prescribed Pain, Sleep and Anxiety Medications.  Patient was seen by physical therapy, who recommended Therapy, SNF placement, which was arranged. On the day of the discharge the patient's vitals were stable, and no other acute medical condition were reported by patient. the patient was felt safe to be discharge at [facility].  Consultants: None Procedures: None  Discharge Exam: General: Appear in no distress, no Rash; Oral Mucosa Clear, moist. Cardiovascular: S1 and S2 Present, no Murmur, Respiratory: normal respiratory effort, Bilateral Air entry present and no Crackles, no wheezes Abdomen: Bowel Sound present, Soft and no tenderness, no hernia Extremities: no Pedal edema, no calf tenderness Neurology: CN grossly intact, no focal deficits. affect appropriate.  Filed Weights   09/23/23 1435  Weight: 72.6 kg   Vitals:   10/04/23 0807 10/04/23 1056  BP: (!) 176/77 (!) 140/56  Pulse: 78 84  Resp: 16 16  Temp: 97.6 F (36.4 C) 97.8 F (36.6 C)  SpO2: 95% 97%    DISCHARGE MEDICATION: Allergies as of 10/04/2023       Reactions   Amlodipine    Carvedilol    Felodipine Swelling   Greater than 5 mg   Lisinopril         Medication List     TAKE these medications    albuterol 108 (90 Base) MCG/ACT inhaler Commonly known as: VENTOLIN HFA Inhale 2 puffs into  the lungs every 6 (six) hours as needed for wheezing.   aspirin 81 MG chewable tablet Chew by mouth daily.   atorvastatin 40 MG tablet Commonly known as: LIPITOR Take 40 mg by mouth daily.   buPROPion 150 MG 12 hr tablet Commonly known as: WELLBUTRIN SR Take 150 mg by mouth 2 (two) times daily.   CALCIUM WITH D3 PO Take 1 capsule by mouth 2 (two) times daily.   cyanocobalamin 1000 MCG tablet Take 1 tablet (1,000 mcg total) by mouth daily. Start taking on: October 05, 2023   felodipine 10 MG 24 hr tablet Commonly known as: PLENDIL Take 1 tablet (10 mg total) by mouth daily. What changed:  medication strength how much to take   Fish Oil 1000 MG Caps Take 1 capsule by mouth 2 (two) times daily.   hydrALAZINE 50 MG tablet Commonly known as: APRESOLINE Take 1 tablet (50 mg total) by mouth every 8 (eight) hours as needed (SBP>150).   insulin glargine 100 UNIT/ML injection Commonly known as: LANTUS Inject 50 Units into the skin as directed.   isosorbide mononitrate 60 MG 24 hr tablet Commonly known as: IMDUR Take 1 tablet (60 mg total) by mouth daily. Start taking on: October 05, 2023   levothyroxine 75  MCG tablet Commonly known as: SYNTHROID Take 75 mcg by mouth daily before breakfast.   metFORMIN 1000 MG tablet Commonly known as: GLUCOPHAGE Take 1,000 mg by mouth 2 (two) times daily with a meal.   metoprolol tartrate 25 MG tablet Commonly known as: LOPRESSOR Take 25 mg by mouth 2 (two) times daily.   omeprazole 40 MG capsule Commonly known as: PRILOSEC Take 40 mg by mouth daily.   OneTouch Verio test strip Generic drug: glucose blood 1 each 2 (two) times daily.   sitaGLIPtin 100 MG tablet Commonly known as: JANUVIA Take 100 mg by mouth daily.   thiamine 100 MG tablet Commonly known as: Vitamin B-1 Take 1 tablet (100 mg total) by mouth daily. Start taking on: October 05, 2023       Allergies  Allergen Reactions   Amlodipine    Carvedilol     Felodipine Swelling    Greater than 5 mg   Lisinopril    Discharge Instructions     Call MD for:  difficulty breathing, headache or visual disturbances   Complete by: As directed    Call MD for:  extreme fatigue   Complete by: As directed    Call MD for:  persistant dizziness or light-headedness   Complete by: As directed    Call MD for:  persistant nausea and vomiting   Complete by: As directed    Call MD for:  severe uncontrolled pain   Complete by: As directed    Call MD for:  temperature >100.4   Complete by: As directed    Diet - low sodium heart healthy   Complete by: As directed    Discharge instructions   Complete by: As directed    Follow-up with PCP, patient should be seen by an MD in 1 to 2 days, continue to monitor BP and titrate medication accordingly.   Increase activity slowly   Complete by: As directed        The results of significant diagnostics from this hospitalization (including imaging, microbiology, ancillary and laboratory) are listed below for reference.    Significant Diagnostic Studies: MR BRAIN WO CONTRAST Result Date: 09/24/2023 CLINICAL DATA:  Initial evaluation for neuro deficit, stroke suspected. EXAM: MRI HEAD WITHOUT CONTRAST TECHNIQUE: Multiplanar, multiecho pulse sequences of the brain and surrounding structures were obtained without intravenous contrast. COMPARISON:  Prior CT from 09/23/2023. FINDINGS: Brain: Mild age-related cerebral atrophy. Patchy T2/FLAIR hyperintensity involving the periventricular and deep white matter both cerebral hemispheres, most characteristic of chronic microvascular ischemic disease, moderately advanced in nature. Small remote lacunar infarct present at the left frontal cerebral white matter. No evidence for acute or subacute ischemia. Gray-white matter differentiation maintained. No acute or chronic intracranial blood products. No mass lesion, midline shift or mass effect. No hydrocephalus or extra-axial fluid  collection. Pituitary gland and suprasellar region within normal limits. Vascular: Major intracranial vascular flow voids are maintained. Skull and upper cervical spine: Craniocervical junction within normal limits. Bone marrow signal intensity normal. No scalp soft tissue abnormality. Sinuses/Orbits: Globes orbital soft tissues within normal limits. Paranasal sinuses are largely clear. No significant mastoid effusion. Other: None. IMPRESSION: 1. No acute intracranial abnormality. 2. Age-related cerebral atrophy with moderate chronic microvascular ischemic disease. Electronically Signed   By: Rise Mu M.D.   On: 09/24/2023 05:07   CT Lumbar Spine Wo Contrast Result Date: 09/23/2023 CLINICAL DATA:  Back trauma, no prior imaging (Age >= 16y). EXAM: CT LUMBAR SPINE WITHOUT CONTRAST TECHNIQUE: Multidetector CT imaging of the  lumbar spine was performed without intravenous contrast administration. Multiplanar CT image reconstructions were also generated. RADIATION DOSE REDUCTION: This exam was performed according to the departmental dose-optimization program which includes automated exposure control, adjustment of the mA and/or kV according to patient size and/or use of iterative reconstruction technique. COMPARISON:  CT abdomen/pelvis 11/29/2015. FINDINGS: Segmentation: Conventional numbering is assumed with 5 non-rib-bearing, lumbar type vertebral bodies. Alignment: Grade 1 anterolisthesis of L4 on L5. Vertebrae: Degenerative Schmorl's node in the inferior endplate of L4. Normal vertebral body heights. No suspicious bone lesions or evidence of acute fracture. Paraspinal and other soft tissues: Atherosclerotic calcifications of the abdominal aorta and its branches. Disc levels: Multilevel lumbar spondylosis, worst at L4-5, where anterolisthesis with uncovered disc and facet arthropathy contribute to at least mild spinal canal stenosis and severe narrowing of the left lateral recess. IMPRESSION: 1. No acute  fracture or traumatic listhesis of the lumbar spine. 2. Multilevel lumbar spondylosis, worst at L4-5, where anterolisthesis with uncovered disc and facet arthropathy contribute to at least mild spinal canal stenosis and severe narrowing of the left lateral recess. Aortic Atherosclerosis (ICD10-I70.0). Electronically Signed   By: Orvan Falconer M.D.   On: 09/23/2023 18:33   DG Chest 2 View Result Date: 09/23/2023 CLINICAL DATA:  Weakness EXAM: CHEST - 2 VIEW COMPARISON:  None Available. FINDINGS: Heart is nonenlarged. No consolidation, pneumothorax or effusion. No edema. Frontal view is rotated to the left and is kyphotic obscuring left lung apex. Lateral view is under penetrated. IMPRESSION: Limited x-rays.  Grossly no acute cardiopulmonary disease. Electronically Signed   By: Karen Kays M.D.   On: 09/23/2023 16:15   CT Cervical Spine Wo Contrast Result Date: 09/23/2023 CLINICAL DATA:  Neck trauma (Age >= 65y); Head trauma, minor (Age >= 65y) EXAM: CT HEAD WITHOUT CONTRAST CT CERVICAL SPINE WITHOUT CONTRAST TECHNIQUE: Multidetector CT imaging of the head and cervical spine was performed following the standard protocol without intravenous contrast. Multiplanar CT image reconstructions of the cervical spine were also generated. RADIATION DOSE REDUCTION: This exam was performed according to the departmental dose-optimization program which includes automated exposure control, adjustment of the mA and/or kV according to patient size and/or use of iterative reconstruction technique. COMPARISON:  None Available. FINDINGS: CT HEAD FINDINGS Brain: No evidence of acute infarction, hemorrhage, hydrocephalus, extra-axial collection or mass lesion/mass effect. Patchy white matter hypodensities, nonspecific but compatible with chronic microvascular ischemic disease. Vascular: Calcific atherosclerosis. Skull: No acute fracture. Sinuses/Orbits: No acute finding. CT CERVICAL SPINE FINDINGS Alignment: Mild anterolisthesis of  C4 on C5, likely degenerative given severe facet arthropathy is level. No substantial sagittal subluxation. Skull base and vertebrae: Vertebral body heights are maintained. No evidence of acute fracture. Soft tissues and spinal canal: No prevertebral fluid or swelling. No visible canal hematoma. Disc levels: Moderate to severe multilevel degenerative change. This includes facet uncovertebral hypertrophy with varying degrees of neural foraminal stenosis. Upper chest: Visualized lung apices are clear. Other: Calcific atherosclerosis. IMPRESSION: No evidence of acute abnormality intracranially or in the cervical spine Electronically Signed   By: Feliberto Harts M.D.   On: 09/23/2023 15:51   CT Head Wo Contrast Result Date: 09/23/2023 CLINICAL DATA:  Neck trauma (Age >= 65y); Head trauma, minor (Age >= 65y) EXAM: CT HEAD WITHOUT CONTRAST CT CERVICAL SPINE WITHOUT CONTRAST TECHNIQUE: Multidetector CT imaging of the head and cervical spine was performed following the standard protocol without intravenous contrast. Multiplanar CT image reconstructions of the cervical spine were also generated. RADIATION DOSE REDUCTION: This exam was  performed according to the departmental dose-optimization program which includes automated exposure control, adjustment of the mA and/or kV according to patient size and/or use of iterative reconstruction technique. COMPARISON:  None Available. FINDINGS: CT HEAD FINDINGS Brain: No evidence of acute infarction, hemorrhage, hydrocephalus, extra-axial collection or mass lesion/mass effect. Patchy white matter hypodensities, nonspecific but compatible with chronic microvascular ischemic disease. Vascular: Calcific atherosclerosis. Skull: No acute fracture. Sinuses/Orbits: No acute finding. CT CERVICAL SPINE FINDINGS Alignment: Mild anterolisthesis of C4 on C5, likely degenerative given severe facet arthropathy is level. No substantial sagittal subluxation. Skull base and vertebrae: Vertebral  body heights are maintained. No evidence of acute fracture. Soft tissues and spinal canal: No prevertebral fluid or swelling. No visible canal hematoma. Disc levels: Moderate to severe multilevel degenerative change. This includes facet uncovertebral hypertrophy with varying degrees of neural foraminal stenosis. Upper chest: Visualized lung apices are clear. Other: Calcific atherosclerosis. IMPRESSION: No evidence of acute abnormality intracranially or in the cervical spine Electronically Signed   By: Feliberto Harts M.D.   On: 09/23/2023 15:51    Microbiology: No results found for this or any previous visit (from the past 240 hours).   Labs: CBC: Recent Labs  Lab 09/28/23 0539 09/30/23 1015 10/02/23 0602 10/03/23 0532 10/04/23 0445  WBC 9.4 9.0 11.7* 10.8* 11.5*  HGB 11.9* 11.8* 12.0 11.5* 10.9*  HCT 36.3 36.2 36.6 35.6* 33.2*  MCV 90.5 91.9 89.9 91.8 90.5  PLT 219 234 284 283 243   Basic Metabolic Panel: Recent Labs  Lab 09/30/23 1015 10/02/23 0602 10/03/23 0532 10/04/23 0445  NA 133* 134* 135 138  K 4.8 4.6 4.5 4.3  CL 101 104 104 105  CO2 22 22 22 23   GLUCOSE 361* 233* 170* 174*  BUN 27* 31* 36* 29*  CREATININE 1.19* 1.29* 1.61* 1.13*  CALCIUM 9.2 9.7 9.7 9.4  MG 1.8 2.0 2.1 1.9  PHOS 3.7 4.3 4.7* 3.8   Liver Function Tests: No results for input(s): "AST", "ALT", "ALKPHOS", "BILITOT", "PROT", "ALBUMIN" in the last 168 hours. No results for input(s): "LIPASE", "AMYLASE" in the last 168 hours. No results for input(s): "AMMONIA" in the last 168 hours. Cardiac Enzymes: Recent Labs  Lab 10/04/23 0445  CKTOTAL 138   BNP (last 3 results) No results for input(s): "BNP" in the last 8760 hours. CBG: Recent Labs  Lab 10/03/23 1113 10/03/23 1725 10/03/23 2013 10/04/23 0809 10/04/23 1144  GLUCAP 221* 191* 217* 162* 170*    Time spent: 35 minutes  Signed:  Gillis Santa  [facility] 10/04/2023 1:34 PM

## 2023-10-04 NOTE — Progress Notes (Signed)
Physical Therapy Treatment Patient Details Name: Pamela Smith MRN: [id number] DOB: [birth year] Today's Date: 10/04/2023   History of Present Illness Pt is a 77 year old female with history of insulin-dependent diabetes mellitus, hyperlipidemia, hypothyroid, hypertension, GERD, who presents emergency department for chief concerns of fall.  Work up showed mild rhabdomyolysis, urinary tract infection and periorbital ecchymosis.    PT Comments  Pt was long sitting in bed upon arrival. [name] is alert and cooperative. Does have som anxiety observed during session but overall was able to fully participate. She demonstrated abilities to exit bed, stand to RW, and ambulate 200 ft without physical assistance. Pt does have some unsteadiness observed however no intervention required. Pt overall states she is feeling better. Acute PT will continue to follow and progress per current POC. Dc recs updated to reflect pt's change in abilities.    If plan is discharge home, recommend the following: A lot of help with walking and/or transfers;A lot of help with bathing/dressing/bathroom;Assist for transportation;Assistance with cooking/housework;Help with stairs or ramp for entrance     Equipment Recommendations  Rolling walker (2 wheels);BSC/3in1;Wheelchair (measurements PT);Wheelchair cushion (measurements PT) (If DCing home)       Precautions / Restrictions Precautions Precautions: Fall Restrictions Weight Bearing Restrictions Per Provider Order: No     Mobility  Bed Mobility Overal bed mobility: Modified Independent Bed Mobility: Supine to Sit, Sit to Supine  Supine to sit: Supervision Sit to supine: Supervision   Transfers Overall transfer level: Needs assistance Equipment used: Rolling walker (2 wheels) Transfers: Sit to/from Stand Sit to Stand: Supervision  General transfer comment: pt did not require physical assistance to stand form EOB or standard toilet. vcs only for improved technique     Ambulation/Gait Ambulation/Gait assistance: Contact guard assist, Supervision Gait Distance (Feet): 200 Feet Assistive device: Rolling walker (2 wheels) Gait Pattern/deviations: Step-through pattern Gait velocity: dec  General Gait Details: Pt easily ambulated 200 ft. Vcs to slow down for additional safety. Slight unsteadiness observed at times but no intervention required   Stairs Stairs:  (pt refused. Seemed anxious with discussion about trialing stairs)      Balance Overall balance assessment: Needs assistance Sitting-balance support: Feet supported Sitting balance-Leahy Scale: Good     Standing balance support: Bilateral upper extremity supported, During functional activity, Reliant on assistive device for balance Standing balance-Leahy Scale: Fair    Cognition Arousal: Alert Behavior During Therapy: WFL for tasks assessed/performed Overall Cognitive Status: Within Functional Limits for tasks assessed    General Comments: Pt endorses feeling much better today versus previous date. Agreeable to session and motivated throughout.               Pertinent Vitals/Pain Pain Assessment Pain Assessment: 0-10 Pain Score: 2  Pain Location: hands Pain Descriptors / Indicators: Discomfort, Numbness, Tingling Pain Intervention(s): Limited activity within patient's tolerance, Monitored during session, Repositioned     PT Goals (current goals can now be found in the care plan section) Acute Rehab PT Goals Patient Stated Goal: to feel better Progress towards PT goals: Progressing toward goals    Frequency    Min 1X/week       AM-PAC PT "6 Clicks" Mobility   Outcome Measure  Help needed turning from your back to your side while in a flat bed without using bedrails?: None Help needed moving from lying on your back to sitting on the side of a flat bed without using bedrails?: A Little Help needed moving to and from a bed to a chair (  including a wheelchair)?: A  Little Help needed standing up from a chair using your arms (e.g., wheelchair or bedside chair)?: A Little Help needed to walk in hospital room?: A Little Help needed climbing 3-5 steps with a railing? : A Little 6 Click Score: 19    End of Session Equipment Utilized During Treatment: Gait belt Activity Tolerance: Patient tolerated treatment well Patient left: in bed;with call bell/phone within reach;with bed alarm set Nurse Communication: Mobility status PT Visit Diagnosis: Other abnormalities of gait and mobility (R26.89);Muscle weakness (generalized) (M62.81);Difficulty in walking, not elsewhere classified (R26.2)     Time: 0981-1914 PT Time Calculation (min) (ACUTE ONLY): 17 min  Charges:    $Gait Training: 8-22 mins PT General Charges $$ ACUTE PT VISIT: 1 Visit                     Jetta Lout PTA 10/04/23, 11:34 AM

## 2023-10-04 NOTE — Care Management Important Message (Signed)
Important Message  Patient Details  Name: Pamela Smith MRN: [id number] Date of Birth: 04/15/1946   Important Message Given:  Yes - Medicare IM     Bernadette Hoit 10/04/2023, 10:11 AM

## 2024-01-28 ENCOUNTER — Encounter

## 2024-06-18 ENCOUNTER — Other Ambulatory Visit: Payer: Self-pay

## 2024-06-18 ENCOUNTER — Emergency Department
Admission: EM | Admit: 2024-06-18 | Discharge: 2024-06-19 | Disposition: A | Discharge status: Home / Self Care | Source: Home / Self Care | Attending: Emergency Medicine | Admitting: Emergency Medicine

## 2024-06-18 DIAGNOSIS — E039 Hypothyroidism, unspecified: Secondary | ICD-10-CM | POA: Insufficient documentation

## 2024-06-18 DIAGNOSIS — I10 Essential (primary) hypertension: Secondary | ICD-10-CM | POA: Insufficient documentation

## 2024-06-18 DIAGNOSIS — Z91148 Patient's other noncompliance with medication regimen for other reason: Secondary | ICD-10-CM | POA: Insufficient documentation

## 2024-06-18 DIAGNOSIS — I6389 Other cerebral infarction: Secondary | ICD-10-CM | POA: Diagnosis not present

## 2024-06-18 DIAGNOSIS — E1165 Type 2 diabetes mellitus with hyperglycemia: Secondary | ICD-10-CM | POA: Insufficient documentation

## 2024-06-18 DIAGNOSIS — R531 Weakness: Secondary | ICD-10-CM | POA: Insufficient documentation

## 2024-06-18 DIAGNOSIS — R0689 Other abnormalities of breathing: Secondary | ICD-10-CM | POA: Insufficient documentation

## 2024-06-18 DIAGNOSIS — R739 Hyperglycemia, unspecified: Secondary | ICD-10-CM

## 2024-06-18 DIAGNOSIS — I639 Cerebral infarction, unspecified: Secondary | ICD-10-CM | POA: Diagnosis not present

## 2024-06-18 LAB — BLOOD GAS, VENOUS
Acid-Base Excess: 4.5 mmol/L — ABNORMAL HIGH (ref 0.0–2.0)
Bicarbonate: 29.8 mmol/L — ABNORMAL HIGH (ref 20.0–28.0)
O2 Saturation: 52.8 %
Patient temperature: 37
pCO2, Ven: 46 mmHg (ref 44–60)
pH, Ven: 7.42 (ref 7.25–7.43)
pO2, Ven: 33 mmHg (ref 32–45)

## 2024-06-18 LAB — CBC WITH DIFFERENTIAL/PLATELET
Abs Immature Granulocytes: 0.03 K/uL (ref 0.00–0.07)
Basophils Absolute: 0 K/uL (ref 0.0–0.1)
Basophils Relative: 0 %
Eosinophils Absolute: 0 K/uL (ref 0.0–0.5)
Eosinophils Relative: 0 %
HCT: 34.8 % — ABNORMAL LOW (ref 36.0–46.0)
Hemoglobin: 11.1 g/dL — ABNORMAL LOW (ref 12.0–15.0)
Immature Granulocytes: 0 %
Lymphocytes Relative: 25 %
Lymphs Abs: 2.8 K/uL (ref 0.7–4.0)
MCH: 27.8 pg (ref 26.0–34.0)
MCHC: 31.9 g/dL (ref 30.0–36.0)
MCV: 87.2 fL (ref 80.0–100.0)
Monocytes Absolute: 1.2 K/uL — ABNORMAL HIGH (ref 0.1–1.0)
Monocytes Relative: 11 %
Neutro Abs: 7.2 K/uL (ref 1.7–7.7)
Neutrophils Relative %: 64 %
Platelets: 228 K/uL (ref 150–400)
RBC: 3.99 MIL/uL (ref 3.87–5.11)
RDW: 15.9 % — ABNORMAL HIGH (ref 11.5–15.5)
WBC: 11.4 K/uL — ABNORMAL HIGH (ref 4.0–10.5)
nRBC: 0 % (ref 0.0–0.2)

## 2024-06-18 LAB — URINALYSIS, ROUTINE W REFLEX MICROSCOPIC
Bacteria, UA: NONE SEEN
Bilirubin Urine: NEGATIVE
Glucose, UA: >=500 mg/dL — AB
Hgb urine dipstick: NEGATIVE
Ketones, ur: 5 mg/dL — AB
Leukocytes,Ua: NEGATIVE
Nitrite: NEGATIVE
Protein, ur: NEGATIVE mg/dL
Specific Gravity, Urine: 1.023 (ref 1.005–1.030)
pH: 6 (ref 5.0–8.0)

## 2024-06-18 LAB — COMPREHENSIVE METABOLIC PANEL WITH GFR
ALT: 43 U/L (ref 0–44)
AST: 48 U/L — ABNORMAL HIGH (ref 15–41)
Albumin: 3.4 g/dL — ABNORMAL LOW (ref 3.5–5.0)
Alkaline Phosphatase: 63 U/L (ref 38–126)
Anion gap: 13 (ref 5–15)
BUN: 16 mg/dL (ref 8–23)
CO2: 25 mmol/L (ref 22–32)
Calcium: 9.1 mg/dL (ref 8.9–10.3)
Chloride: 94 mmol/L — ABNORMAL LOW (ref 98–111)
Creatinine, Ser: 1.21 mg/dL — ABNORMAL HIGH (ref 0.44–1.00)
GFR, Estimated: 46 mL/min — ABNORMAL LOW (ref >60)
Glucose, Bld: 591 mg/dL (ref 70–99)
Potassium: 4.6 mmol/L (ref 3.5–5.1)
Sodium: 132 mmol/L — ABNORMAL LOW (ref 135–145)
Total Bilirubin: 0.9 mg/dL (ref 0.0–1.2)
Total Protein: 6.5 g/dL (ref 6.5–8.1)

## 2024-06-18 LAB — T4, FREE: Free T4: 1.13 ng/dL — ABNORMAL HIGH (ref 0.61–1.12)

## 2024-06-18 LAB — CBG MONITORING, ED
Glucose-Capillary: 540 mg/dL (ref 70–99)
Glucose-Capillary: 451 mg/dL — ABNORMAL HIGH (ref 70–99)
Glucose-Capillary: 418 mg/dL — ABNORMAL HIGH (ref 70–99)
Glucose-Capillary: 447 mg/dL — ABNORMAL HIGH (ref 70–99)

## 2024-06-18 LAB — TSH: TSH: 3.292 u[IU]/mL (ref 0.350–4.500)

## 2024-06-18 LAB — MAGNESIUM: Magnesium: 1.6 mg/dL — ABNORMAL LOW (ref 1.7–2.4)

## 2024-06-18 LAB — BRAIN NATRIURETIC PEPTIDE: B Natriuretic Peptide: 102.5 pg/mL — ABNORMAL HIGH (ref 0.0–100.0)

## 2024-06-18 LAB — BETA-HYDROXYBUTYRIC ACID: Beta-Hydroxybutyric Acid: 0.56 mmol/L — ABNORMAL HIGH (ref 0.05–0.27)

## 2024-06-18 MED ORDER — ALBUTEROL SULFATE (2.5 MG/3ML) 0.083% IN NEBU: 3.0000 mL | INHALATION_SOLUTION | Freq: Four times a day (QID) | RESPIRATORY_TRACT | Status: DC | PRN

## 2024-06-18 MED ORDER — BUPROPION HCL ER (SR) 150 MG PO TB12
150.0000 mg | ORAL_TABLET | Freq: Every day | ORAL | Status: DC
Start: 2024-06-18 — End: 2024-06-19
  Administered 2024-06-18 – 2024-06-19 (×2): 150 mg via ORAL
  Filled 2024-06-18 (×2): qty 1

## 2024-06-18 MED ORDER — LEVOTHYROXINE SODIUM 50 MCG PO TABS
50.0000 ug | ORAL_TABLET | Freq: Every day | ORAL | Status: DC
  Administered 2024-06-19: 50 ug via ORAL
  Filled 2024-06-18: qty 1

## 2024-06-18 MED ORDER — ASPIRIN 81 MG PO CHEW
81.0000 mg | CHEWABLE_TABLET | Freq: Every day | ORAL | Status: DC
  Administered 2024-06-18 – 2024-06-19 (×2): 81 mg via ORAL
  Filled 2024-06-18 (×2): qty 1

## 2024-06-18 MED ORDER — LEVOTHYROXINE SODIUM 50 MCG PO TABS
50.0000 ug | ORAL_TABLET | Freq: Once | ORAL | Status: AC
  Administered 2024-06-18: 50 ug via ORAL
  Filled 2024-06-18: qty 1

## 2024-06-18 MED ORDER — HYDRALAZINE HCL 50 MG PO TABS: 50.0000 mg | ORAL_TABLET | Freq: Three times a day (TID) | ORAL | Status: DC | PRN

## 2024-06-18 MED ORDER — LINAGLIPTIN 5 MG PO TABS
5.0000 mg | ORAL_TABLET | Freq: Every day | ORAL | Status: DC
  Administered 2024-06-18 – 2024-06-19 (×2): 5 mg via ORAL
  Filled 2024-06-18 (×2): qty 1

## 2024-06-18 MED ORDER — METOPROLOL TARTRATE 25 MG PO TABS
25.0000 mg | ORAL_TABLET | Freq: Two times a day (BID) | ORAL | Status: DC
  Administered 2024-06-18 – 2024-06-19 (×2): 25 mg via ORAL
  Filled 2024-06-18 (×2): qty 1

## 2024-06-18 MED ORDER — SODIUM CHLORIDE 0.9 % IV BOLUS
1000.0000 mL | Freq: Once | INTRAVENOUS | Status: AC
  Administered 2024-06-18: 1000 mL via INTRAVENOUS

## 2024-06-18 MED ORDER — INSULIN GLARGINE 100 UNIT/ML ~~LOC~~ SOLN
50.0000 [IU] | SUBCUTANEOUS | Status: DC
  Administered 2024-06-18: 50 [IU] via SUBCUTANEOUS
  Filled 2024-06-18 (×2): qty 0.5

## 2024-06-18 MED ORDER — INSULIN ASPART 100 UNIT/ML IJ SOLN: 0.0000 [IU] | Freq: Every day | INTRAMUSCULAR | Status: DC

## 2024-06-18 MED ORDER — ATORVASTATIN CALCIUM 20 MG PO TABS
40.0000 mg | ORAL_TABLET | Freq: Every day | ORAL | Status: DC
  Administered 2024-06-18 – 2024-06-19 (×2): 40 mg via ORAL
  Filled 2024-06-18 (×2): qty 2

## 2024-06-18 MED ORDER — PANTOPRAZOLE SODIUM 40 MG PO TBEC
40.0000 mg | DELAYED_RELEASE_TABLET | Freq: Every day | ORAL | Status: DC
  Administered 2024-06-18 – 2024-06-19 (×2): 40 mg via ORAL
  Filled 2024-06-18 (×2): qty 1

## 2024-06-18 MED ORDER — INSULIN ASPART 100 UNIT/ML IJ SOLN: 0.0000 [IU] | Freq: Three times a day (TID) | INTRAMUSCULAR | Status: DC

## 2024-06-18 MED ORDER — ISOSORBIDE MONONITRATE ER 60 MG PO TB24
60.0000 mg | ORAL_TABLET | Freq: Every day | ORAL | Status: DC
  Administered 2024-06-18 – 2024-06-19 (×2): 60 mg via ORAL
  Filled 2024-06-18 (×2): qty 1

## 2024-06-18 MED ORDER — INSULIN ASPART 100 UNIT/ML IJ SOLN
5.0000 [IU] | Freq: Once | INTRAMUSCULAR | Status: AC
  Administered 2024-06-18: 5 [IU] via INTRAVENOUS
  Filled 2024-06-18: qty 5

## 2024-06-18 MED ORDER — METFORMIN HCL 500 MG PO TABS
1000.0000 mg | ORAL_TABLET | Freq: Two times a day (BID) | ORAL | Status: DC
  Administered 2024-06-19: 1000 mg via ORAL
  Filled 2024-06-18 (×2): qty 2

## 2024-06-18 MED ORDER — MAGNESIUM SULFATE 2 GM/50ML IV SOLN
2.0000 g | Freq: Once | INTRAVENOUS | Status: AC
  Administered 2024-06-18: 2 g via INTRAVENOUS
  Filled 2024-06-18: qty 50

## 2024-06-18 NOTE — ED Notes (Signed)
 This tech and Therapist, sports changed pt at this time. New brief and chuck pad have been placed.

## 2024-06-18 NOTE — Discharge Instructions (Signed)
 You were seen in our emergency department for generalized weakness after 1 month of not taking your home medication including your insulin .   1) please take your medications as prescribed.  Do not miss any medications 2) only ambulate with your walker.  You have difficulty going from sitting to standing and you need your walker to push-up against 3) call your primary care physician tomorrow and make an urgent appointment and have your friend the nurse come and check in on you.  We have also arranged home health services. 4) return with any acutely worsening symptoms or any other emergency -- RETURN PRECAUTIONS & AFTERCARE: (ENGLISH) RETURN PRECAUTIONS: Return immediately to the emergency department or see/call your doctor if you feel worse, weak or have changes in speech or vision, are short of breath, have fever, vomiting, pain, bleeding or dark stool, trouble urinating or any new issues. Return here or see/call your doctor if not improving as expected for your suspected condition. FOLLOW-UP CARE: Call your doctor and/or any doctors we referred you to for more advice and to make an appointment. Do this today, tomorrow or after the weekend. Some doctors only take PPO insurance so if you have HMO insurance you may want to contact your HMO or your regular doctor for referral to a specialist within your plan. Either way tell the doctor's office that it was a referral from the emergency department so you get the soonest possible appointment.  YOUR TEST RESULTS: Take result reports of any blood or urine tests, imaging tests and EKG's to your doctor and any referral doctor. Have any abnormal tests repeated. Your doctor or a referral doctor can let you know when this should be done. Also make sure your doctor contacts this hospital to get any test results that are not currently available such as cultures or special tests for infection and final imaging reports, which are often not available at the time you leave  the ER but which may list additional important findings that are not documented on the preliminary report. BLOOD PRESSURE: If your blood pressure was greater than 120/80 have your blood pressure rechecked within 1 to 2 weeks. MEDICATION SIDE EFFECTS: Do not drive, walk, bike, take the bus, etc. if you have received or are being prescribed any sedating medications such as those for pain or anxiety or certain antihistamines like Benadryl. If you have been give one of these here get a taxi home or have a friend drive you home. Ask your pharmacist to counsel you on potential side effects of any new medication   Home Health services for PT, RN, OT and SW set up with [facility]. You will receive a follow-up call after your hospital discharge.

## 2024-06-18 NOTE — ED Notes (Signed)
 Patient arrived by EMS with Bowel incontinence. Patient cleaned and brief placed. Warm blankets given.  Patient soiled clothing placed in patient belongings bag at bedside

## 2024-06-18 NOTE — ED Provider Notes (Signed)
 [facility] Provider Note    Event Date/Time   First MD Initiated Contact with Patient 06/18/24 1500     (approximate)   History   No chief complaint on file.   HPI  Pamela Smith is a 78 y.o. female  ith history of insulin -dependent diabetes mellitus, hyperlipidemia, hypothyroid, hypertension, GERD who presents with generalized weakness found after wellness check called in by her [place] sisters.  Patient tells me that she has not taken any of her medications for the past month because she is stubborn.  Denies any pain including no chest pain shortness of breath abdominal pain.  States that she has not fallen in the last month.  Per report EMS has been to the house on 2 occasions today as she has called for weakness and help getting off of the toilet.  Patient lives by herself alone.      Physical Exam   Triage Vital Signs: ED Triage Vitals  Encounter Vitals Group     BP 06/18/24 1505 (!) 150/76     Girls Systolic BP Percentile --      Girls Diastolic BP Percentile --      Boys Systolic BP Percentile --      Boys Diastolic BP Percentile --      Pulse Rate 06/18/24 1505 (!) 111     Resp 06/18/24 1505 20     Temp --      Temp src --      SpO2 06/18/24 1505 97 %     Weight 06/18/24 1506 158 lb 11.7 oz (72 kg)     Height 06/18/24 1506 5' 3 (1.6 m)     Head Circumference --      Peak Flow --      Pain Score 06/18/24 1506 0     Pain Loc --      Pain Education --      Exclude from Growth Chart --     Most recent vital signs: Vitals:   06/18/24 1830 06/18/24 1854  BP: (!) 164/84   Pulse: 80   Resp: 19   Temp:  97.8 F (36.6 C)  SpO2: 97%     Nursing Triage Note reviewed. Vital signs reviewed and patients oxygen saturation is normoxic  General: Patient is well nourished, well developed, awake and alert, resting comfortably in no acute distress Head: Normocephalic and atraumatic Eyes: Normal inspection, extraocular muscles intact,  no conjunctival pallor Ear, nose, throat: Normal external exam Neck: Normal range of motion Respiratory: Patient is in no respiratory distress, lungs CTAB Cardiovascular: Patient is not tachycardic, RRR without murmur appreciated GI: Abd SNT with no guarding or rebound  Back: Normal inspection of the back with good strength and range of motion throughout all ext Extremities: pulses intact with good cap refills, no LE pitting edema or calf tenderness Neuro: The patient is alert and oriented to person, place, and time, but easily confused with 5/5 bilat UE/LE strength, no gross motor or sensory defects noted. Coordination appears to be adequate. Skin: Warm, dry, and intact Psych: normal mood and affect, no SI or HI  ED Results / Procedures / Treatments   Labs (all labs ordered are listed, but only abnormal results are displayed) Labs Reviewed  BETA-HYDROXYBUTYRIC ACID - Abnormal; Notable for the following components:      Result Value   Beta-Hydroxybutyric Acid 0.56 (*)    All other components within normal limits  COMPREHENSIVE METABOLIC PANEL WITH GFR - Abnormal; Notable  for the following components:   Sodium 132 (*)    Chloride 94 (*)    Glucose, Bld 591 (*)    Creatinine, Ser 1.21 (*)    Albumin 3.4 (*)    AST 48 (*)    GFR, Estimated 46 (*)    All other components within normal limits  MAGNESIUM  - Abnormal; Notable for the following components:   Magnesium  1.6 (*)    All other components within normal limits  BRAIN NATRIURETIC PEPTIDE - Abnormal; Notable for the following components:   B Natriuretic Peptide 102.5 (*)    All other components within normal limits  URINALYSIS, ROUTINE W REFLEX MICROSCOPIC - Abnormal; Notable for the following components:   Color, Urine YELLOW (*)    APPearance CLEAR (*)    Glucose, UA >=500 (*)    Ketones, ur 5 (*)    All other components within normal limits  T4, FREE - Abnormal; Notable for the following components:   Free T4 1.13 (*)     All other components within normal limits  CBC WITH DIFFERENTIAL/PLATELET - Abnormal; Notable for the following components:   WBC 11.4 (*)    Hemoglobin 11.1 (*)    HCT 34.8 (*)    RDW 15.9 (*)    Monocytes Absolute 1.2 (*)    All other components within normal limits  BLOOD GAS, VENOUS - Abnormal; Notable for the following components:   Bicarbonate 29.8 (*)    Acid-Base Excess 4.5 (*)    All other components within normal limits  CBG MONITORING, ED - Abnormal; Notable for the following components:   Glucose-Capillary 540 (*)    All other components within normal limits  CBG MONITORING, ED - Abnormal; Notable for the following components:   Glucose-Capillary 451 (*)    All other components within normal limits  CBG MONITORING, ED - Abnormal; Notable for the following components:   Glucose-Capillary 418 (*)    All other components within normal limits  TSH  HEMOGLOBIN A1C     EKG EKG and rhythm strip are interpreted by myself:   EKG: tachycardic sinus rhythm] at heart rate of 112, normal QRS duration, QTc 452, normal or nonspecific ST segments and T waves no ectopy EKG not consistent with Acute STEMI Rhythm strip: tachycardic sinus rhythm in lead II   RADIOLOGY None   PROCEDURES:  Critical Care performed: No  Procedures   MEDICATIONS ORDERED IN ED: Medications  albuterol  (PROVENTIL ) (2.5 MG/3ML) 0.083% nebulizer solution 3 mL (has no administration in time range)  aspirin  chewable tablet 81 mg (has no administration in time range)  atorvastatin  (LIPITOR) tablet 40 mg (has no administration in time range)  buPROPion  (WELLBUTRIN  SR) 12 hr tablet 150 mg (has no administration in time range)  hydrALAZINE  (APRESOLINE ) tablet 50 mg (has no administration in time range)  insulin  glargine (LANTUS ) injection 50 Units (has no administration in time range)  isosorbide  mononitrate (IMDUR ) 24 hr tablet 60 mg (has no administration in time range)  levothyroxine  (SYNTHROID )  tablet 50 mcg (has no administration in time range)  metoprolol  tartrate (LOPRESSOR ) tablet 25 mg (has no administration in time range)  metFORMIN  (GLUCOPHAGE ) tablet 1,000 mg (has no administration in time range)  linagliptin  (TRADJENTA ) tablet 5 mg (has no administration in time range)  pantoprazole  (PROTONIX ) EC tablet 40 mg (has no administration in time range)  sodium chloride  0.9 % bolus 1,000 mL (0 mLs Intravenous Stopped 06/18/24 1855)  magnesium  sulfate IVPB 2 g 50 mL (0 g Intravenous  Stopped 06/18/24 1848)  levothyroxine  (SYNTHROID ) tablet 50 mcg (50 mcg Oral Given 06/18/24 1656)  insulin  aspart (novoLOG ) injection 5 Units (5 Units Intravenous Given 06/18/24 1645)     IMPRESSION / MDM / ASSESSMENT AND PLAN / ED COURSE                                Differential diagnosis includes, but is not limited to, DKA, hyprerglycemic, hyperosmotic state, electrolyte derangement thyroid abnormality, UTI  ED course: Patient presents with generalized weakness after 1 month of not taking any of her medications.  She has no evidence of trauma on her denies any falls.  She has no focal neurological deficits.  Her glucose read high for EMS and I am concerned about the potential of DKA.  She is tachycardic but afebrile here.  EKG demonstrated no acute ischemia and she denies any pain or physical complaints other than weakness generally.  Disposition pending blood work and reassessment.  In the interim, I have initiated IV fluid  8:51 PM on 06/18/2024: Patient does not demonstrate evidence of DKA.  Labs are reassuring except for elevated glucose and thyroid derangement in the setting of noncompliance of her medications.  Patient was convinced by her sister to stay for [facility] and possibly skilled nursing facility placement.  Patient made a boarder.  I did did place consults for [facility] and also the diabetes educator.  I will also order PT and OT. She is on glucose checks.  will plan to sign patient out to oncoming  physician at 11 PM    Clinical Course as of 06/18/24 2052  Thu Jun 18, 2024  1600 B Natriuretic Peptide(!): 102.5 Only mildly elevated [HD]  1622 Glucose(!!): 591 Bili elevated [HD]  1622 Anion gap: 13 No anion gap [HD]  1622 Magnesium (!): 1.6 Will replete [HD]  1647 Patient states that she would prefer to return home at all possible and understands that she was fluish not taking her medications.  She gives me permission to talk to her younger sister once all of her labs have returned [HD]  1750 Blood gas, venous(!) No acidosis [HD]  1751 Attempted to call patient's sister and emergency contact Pamela Smith, no answer, left a message to call back the ED  [HD]  1759 Patient is adamant that she wants to return home.  She states that she is still paying off bills from her last hospitalization.  She states that there was a Engineer, civil (consulting) in town that is a family friend that can take care of her.  She was able to ambulate around the unit however has difficulty initiating sitting to standing but it has not been using her 2 walkers which she has at home [HD]  1852 CBC with Differential(!) Slight leukocytosis but no profound anemia [HD]  1914 Glucose-Capillary(!): 451 Improving [HD]  2022 Urinalysis, Routine w reflex microscopic -Urine, Clean Catch(!) No evidence of UTI and no ketones.  Patient offered admission once again and declines.  I do think she has the capacity to make this decision.  Will ready the patient for discharge [HD]  2043 Patient's sister did return call and patient has been convinced to this day as a portal for skilled nursing placement.  Will transition the patient [HD]    Clinical Course User Index [HD] Nicholaus Rolland BRAVO, MD     FINAL CLINICAL IMPRESSION(S) / ED DIAGNOSES   Final diagnoses:  Hyperglycemia  Medication nonadherence due to  psychosocial problem  General weakness     Rx / DC Orders   ED Discharge Orders     None        Note:  This document was  prepared using Dragon voice recognition software and may include unintentional dictation errors.   Nicholaus Rolland BRAVO, MD 06/18/24 2053    Nicholaus Rolland BRAVO, MD 06/19/24 [phone]

## 2024-06-18 NOTE — ED Notes (Signed)
 Spoke to Devere (sister at [phone]) and updated her with the patient's permission.  She didn't know some of the facts that led to her coming here such as unable to get off toilet and calling EMS to come help her, not taking her medications and her BG being over 500.  Devere said that she does not need to be going home alone because she can't take care of herself, she has a fridge of moldy food that she will eat because she doesn't know any better and that her and her sister, Slater, will be coming to town tomorrow to work on getting her in to a home.  Devere spoke to patient and convinced her to stay the night with us .  Devere and Slater are hoping to be in town tomorrow around lunch time as both live over 3 hours away.

## 2024-06-18 NOTE — ED Triage Notes (Signed)
 Pt to ED via [facility] from home. Pt's sister called for wellness check. Pt reports to EMS that she has been having weakness. Unknown LKW. Pt is unable to provide details but states she is feels out of it. CBG 503. Noncompliant with medications. Pt is poor historian.  BP 116/62 HR 116 95% RA  T 97.5  RR 20

## 2024-06-19 ENCOUNTER — Encounter: Payer: Self-pay | Admitting: Oncology

## 2024-06-19 ENCOUNTER — Other Ambulatory Visit: Payer: Self-pay

## 2024-06-19 LAB — CBG MONITORING, ED
Glucose-Capillary: 217 mg/dL — ABNORMAL HIGH (ref 70–99)
Glucose-Capillary: 313 mg/dL — ABNORMAL HIGH (ref 70–99)
Glucose-Capillary: 376 mg/dL — ABNORMAL HIGH (ref 70–99)
Glucose-Capillary: 470 mg/dL — ABNORMAL HIGH (ref 70–99)

## 2024-06-19 LAB — HEMOGLOBIN A1C
Hgb A1c MFr Bld: 11.1 % — ABNORMAL HIGH (ref 4.8–5.6)
Mean Plasma Glucose: 272 mg/dL

## 2024-06-19 MED ORDER — INSULIN ASPART 100 UNIT/ML IJ SOLN
0.0000 [IU] | Freq: Three times a day (TID) | INTRAMUSCULAR | Status: DC
  Administered 2024-06-19: 11 [IU] via SUBCUTANEOUS
  Administered 2024-06-19: 5 [IU] via SUBCUTANEOUS
  Filled 2024-06-19: qty 5
  Filled 2024-06-19: qty 11

## 2024-06-19 NOTE — ED Notes (Signed)
 This tech and [place] EDT assisted pt to the toilet. Pt urinated in toilet. Pt assisted back to bed. Call bell in reach. No other needs verbalized at this time.

## 2024-06-19 NOTE — ED Notes (Signed)
 Discharge instructions reviewed with pt and sister(Susan). Pt and sister verbalized understanding. Pt wheeled out to car in wheelchair to car.

## 2024-06-19 NOTE — ED Provider Notes (Signed)
-----------------------------------------   5:56 AM on 06/19/2024 -----------------------------------------   Blood pressure (!) 111/57, pulse 82, temperature 97.8 F (36.6 C), temperature source Oral, resp. rate (!) 27, height 5' 3 (1.6 m), weight 72 kg, SpO2 97%.  The patient is calm and cooperative at this time.  There have been no acute events since the last update.  Awaiting disposition plan from case management/social work.    Lanayah Gartley, Josette SAILOR, DO 06/19/24 [phone]

## 2024-06-19 NOTE — TOC Initial Note (Addendum)
 Transition of Care [facility]) - Initial/Assessment Note    Patient Details  Name: Pamela Smith MRN: [id number] Date of Birth: [birth year]  Transition [facility] CM/SW Contact:    [place] L Georgene Kopper, LCSW Phone Number: 06/19/2024, 11:16 AM  Clinical Narrative:                  Brief assessment completed with patient. No family at bedside. Patient reported that she resides alone. She stated that she has two sisters and a brother. She is the eldest child. Her brother lives in [place]. One sister lives in [place], [place] and the other lives in [place], [place]. She stated that her sister that resides in [facility] [place], supports her financially. She stated that her sister has purchased her DME re: RW and shower chair. She advised that she has no doubt that her sister would support her with private care if needed.   Prior to this admission patient was driving herself to doctor's appointments and managing her needs at home. She stated that her prescriptions are delivered by [employer]. She utilizes [employer] for her groceries.   Recommendations were discussed. Patient declined SNF placement. She was open to [facility] services in the home. CSW advised that max assistance re: PT, OT RN and SW were recommended. Patient received services from [facility] as recent as May 2025 and she was agreeable to a referral being made to the same agency.   Recommendations for DME were discussed. Patient was open to receiving a BSC. She stated that if her insurance does not pay for it, her sister Devere will pay for it. Patient was agreeable to using [facility] to fill DME order.   PCP is at [facility]. Patient is A&O x4.   Patient confirmed her address to be [address] [place], [place] [address]. She advised that she will need transportation to return home. A taxi seems appropriate for patient as she is mobile. She was agreeable to being transported home in a taxi. She reported having a bill from [facility] that she has not  been able to pay. Transportation will be further evaluated at discharge.   CSW reached out to [place]. Patient needs were discussed. Services have been set up with a delay in RN. RN not available to next week. Larraine agreed to re-assess the need for RN when available.     12:28pm: CSW contacted sister, Slater, and advised of discharge plan. Slater expressed some concerns about patient living alone and not taking care of herself. CSW explained that patient is alert and oriented and she is able to make her own decisions. CSW expressed an understanding for the family's concern and advised that patient is being set up with HHA and possibly medications to set patient up for success.   Slater stated patient has no food in the home and the sister that lives in [facility] owns the condo but doesn't want patient there because the condo is a mess. CSW advised that patient is medically stable and she at this time is declining SNF placement. CSW explained that patient is not in the condition where the insurance would approve SNF placement.   Slater advised that the sister that lives in [place] is on her way to [facility] to assist patient. CSW advised that unless there is a HCPOA or legal guardian in place, patient is allowed autonomy. Slater advised that she understood.    Patient Goals and CMS Choice  Expected Discharge Plan and Services                                              Prior Living Arrangements/Services                       Activities of Daily Living      Permission Sought/Granted                  Emotional Assessment              Admission diagnosis:  Hyperglycemia Patient Active Problem List   Diagnosis Date Noted   Alcohol use 09/24/2023   Numbness of fingers of both hands 09/24/2023   Periorbital ecchymosis, left, initial encounter 09/24/2023   Rhabdomyolysis 09/23/2023   Pyuria 09/23/2023   Type 2 diabetes mellitus with hyperglycemia, with  long-term current use of insulin  (HCC) 08/07/2019   Osteopenia of multiple sites 07/12/2018   Iron  deficiency anemia due to chronic blood loss 04/04/2018   Transaminitis 10/04/2017   Chronic anemia 09/01/2015   Mixed hyperlipidemia 03/01/2015   Vasovagal reaction 03/01/2015   Hyponatremia 11/13/2013   Hypo-osmolality and hyponatremia 11/13/2013   Obesity 06/25/2013   Major depressive disorder, single episode, unspecified 08/20/2006   Hypothyroidism 08/14/2004   Essential (primary) hypertension 08/14/2004   PCP:  Sadie Manna, MD Pharmacy:   [facility]) - [place], [place] - [address] [facility] [address] [place] [address] [place] [address] Phone: [phone] Fax: [phone]  [facility] [address], [place] - [address] [place] [address] Phone: [phone] Fax: [phone]     Social Drivers of Health (SDOH) Social History: SDOH Screenings   Food Insecurity: No Food Insecurity (09/24/2023)  Housing: Low Risk  (12/23/2023)   Received from [facility]  Recent Concern: Housing - Medium Risk (09/24/2023)  Transportation Needs: No Transportation Needs (09/24/2023)  Utilities: Not At Risk (09/24/2023)  Financial Resource Strain: Low Risk  (08/23/2023)   Received from [facility]  Tobacco Use: Medium Risk (06/18/2024)   SDOH Interventions:     Readmission Risk Interventions    09/24/2023   11:01 AM  Readmission Risk Prevention Plan  Post Dischage Appt Complete  Medication Screening Complete  Transportation Screening Complete

## 2024-06-19 NOTE — Evaluation (Signed)
 Physical Therapy Evaluation Patient Details Name: Pamela Smith MRN: [id number] DOB: 02-04-1946 Today's Date: 06/19/2024  History of Present Illness  Pt is a 78 y.o. female  ith history of insulin -dependent diabetes mellitus, hyperlipidemia, hypothyroid, hypertension, GERD who presents with generalized weakness found after wellness check called in by her out-of-state sisters. MD assessment includes: hyperglycemia, general weakness, and medication nonadherence due to psychosocial problem.   Clinical Impression  Pt was pleasant and motivated to participate during the session and put forth good effort throughout. Pt required no physical assistance with below functional tasks and was generally steady with no overt LOB during static standing without an AD and while using a RW to ambulate.  Min verbal cues given to amb closer to the RW with upright posture and to stay within the RW during sharp turns.  Pt reported no adverse symptoms during the session with SpO2 and HR WNL throughout on room air.  Pt will benefit from continued PT services upon discharge to safely address deficits listed in patient problem list for decreased caregiver assistance and eventual return to PLOF.           If plan is discharge home, recommend the following: A little help with walking and/or transfers;A little help with bathing/dressing/bathroom;Assist for transportation;Assistance with cooking/housework   Can travel by private vehicle        Equipment Recommendations BSC/3in1  Recommendations for Other Services       Functional Status Assessment Patient has had a recent decline in their functional status and demonstrates the ability to make significant improvements in function in a reasonable and predictable amount of time.     Precautions / Restrictions Precautions Precautions: Fall Restrictions Weight Bearing Restrictions Per Provider Order: No      Mobility  Bed Mobility Overal bed mobility: Modified  Independent             General bed mobility comments: Min extra time and effort only    Transfers Overall transfer level: Needs assistance Equipment used: Rolling walker (2 wheels) Transfers: Sit to/from Stand Sit to Stand: Supervision           General transfer comment: Good eccentric and concentric control and stability    Ambulation/Gait Ambulation/Gait assistance: Supervision Gait Distance (Feet): 100 Feet Assistive device: Rolling walker (2 wheels) Gait Pattern/deviations: Step-through pattern, Decreased step length - right, Decreased step length - left Gait velocity: decreased     General Gait Details: Min decreased cadence and bilateral step length but steady with no overt LOB  Stairs            Wheelchair Mobility     Tilt Bed    Modified Rankin (Stroke Patients Only)       Balance Overall balance assessment: Needs assistance, History of Falls   Sitting balance-Leahy Scale: Normal     Standing balance support: Bilateral upper extremity supported, During functional activity Standing balance-Leahy Scale: Good                               Pertinent Vitals/Pain Pain Assessment Pain Assessment: No/denies pain    Home Living Family/patient expects to be discharged to:: Private residence Living Arrangements: Alone Available Help at Discharge: Available PRN/intermittently;Family;Friend(s) Type of Home: House Home Access: Level entry       Home Layout: One level Home Equipment: Cane - single Librarian, academic (2 wheels);Rollator (4 wheels);Shower seat;Grab bars - tub/shower;Hand held shower head  Prior Function Prior Level of Function : Independent/Modified Independent;Driving             Mobility Comments: Mod Ind amb mostly with a rollator but occasionally with a SPC or without an AD in the home, 3-5 falls in the last 6 months, pt unsure of cause, drives, recent history of going to a fitness center 2x/wk ADLs  Comments: Ind with ADLs, sits down on shower chair and uses HH shower to bathe     Extremity/Trunk Assessment   Upper Extremity Assessment Upper Extremity Assessment: Overall WFL for tasks assessed    Lower Extremity Assessment Lower Extremity Assessment: Generalized weakness       Communication   Communication Communication: No apparent difficulties    Cognition Arousal: Alert Behavior During Therapy: WFL for tasks assessed/performed   PT - Cognitive impairments: No apparent impairments                         Following commands: Intact       Cueing Cueing Techniques: Verbal cues     General Comments      Exercises     Assessment/Plan    PT Assessment Patient needs continued PT services  PT Problem List Decreased strength;Decreased activity tolerance;Decreased balance;Decreased mobility       PT Treatment Interventions DME instruction;Gait training;Functional mobility training;Therapeutic activities;Therapeutic exercise;Balance training;Patient/family education    PT Goals (Current goals can be found in the Care Plan section)  Acute Rehab PT Goals Patient Stated Goal: To return home PT Goal Formulation: With patient Time For Goal Achievement: 07/02/24 Potential to Achieve Goals: Good    Frequency Min 2X/week     Co-evaluation               AM-PAC PT 6 Clicks Mobility  Outcome Measure Help needed turning from your back to your side while in a flat bed without using bedrails?: None Help needed moving from lying on your back to sitting on the side of a flat bed without using bedrails?: None Help needed moving to and from a bed to a chair (including a wheelchair)?: A Little Help needed standing up from a chair using your arms (e.g., wheelchair or bedside chair)?: A Little Help needed to walk in hospital room?: A Little Help needed climbing 3-5 steps with a railing? : A Little 6 Click Score: 20    End of Session Equipment Utilized  During Treatment: Gait belt Activity Tolerance: Patient tolerated treatment well Patient left: in bed;with call bell/phone within reach Nurse Communication: Mobility status PT Visit Diagnosis: History of falling (Z91.81);Difficulty in walking, not elsewhere classified (R26.2);Muscle weakness (generalized) (M62.81)    Time: 9150-9085 PT Time Calculation (min) (ACUTE ONLY): 25 min   Charges:   PT Evaluation $PT Eval Moderate Complexity: 1 Mod   PT General Charges $$ ACUTE PT VISIT: 1 Visit       D. Scott Annaston Upham PT, DPT 06/19/24, 9:55 AM

## 2024-06-19 NOTE — Progress Notes (Signed)
 Patient is not able to walk the distance required to go the bathroom, or he/she is unable to safely negotiate stairs required to access the bathroom.  A 3in1 BSC will alleviate this problem

## 2024-06-19 NOTE — Evaluation (Signed)
 Occupational Therapy Evaluation Patient Details Name: Pamela Smith MRN: [id number] DOB: 11-Sep-1946 Today's Date: 06/19/2024   History of Present Illness   Pt is a 78 year old female presents with generalized weakness found after wellness check called in by her out-of-state sisters    PMH significant for insulin -dependent diabetes mellitus, hyperlipidemia, hypothyroid, hypertension, GERD     Clinical Impressions Chart reviewed to date, pt greeted semi supine in bed, alert and oriented x4, agreeable to OT evaluation. PTA pt reports she is MOD I-I in ADL/IADL however recently she has not been taking her medicines or driving to the grocery store. She reports she had trouble getting off the toilet recently. Pt has fair safety awareness and does presents with fair carry over of education during session. Pt presents with deficits in strength, endurance, activity tolerance, balance, cognition, affecting safe and optimal ADL completion. Bed mobility completed with MOD I, STS from ED stretchier with supervision with RW, from ED toilet with MIN A. Pt requires multi modal cues for problem solving. She performs toileting with supervision. Pt educated re use of AE/DME for safer ADL completion. Would recommend follow up therapy to address functional deficits. Pt is left in bed, all needs met. OT will continue to follow.    Pt completed SLUMS examination this date scoring 16/30 indicating a positive screen for dementia. Of note, it is not within occupational therapy scope of practice to diagnose cognitive impairments, this screen indicates need for further testing. The SLUMS is a 30 point, 11 question screening questionnaire that tests orientation, memory, attention, and executive function. Pt with noted impairments, limiting ability to safely perform ADL/IADL   [facility] Mental Status Examination Orientation: 3/3  Calculations: 0/3 Naming animals: 1/3 Patient named 9 animals (0 points is 0-4  animals; 1 is 5-9 animals; 2 is 10-14 animals; 3 is 15+ animals) Recall: 2/5  Attention: 0/2 Clock drawing: 0/4 Visual Processing: 2/2 Paragraph Memory: 8/8  Total: 16/30;  Given that patient has some college level of education, this score falls in the dementia range.       If plan is discharge home, recommend the following:   A little help with bathing/dressing/bathroom;Assist for transportation;Assistance with cooking/housework;Direct supervision/assist for medications management;Direct supervision/assist for financial management     Functional Status Assessment   Patient has had a recent decline in their functional status and demonstrates the ability to make significant improvements in function in a reasonable and predictable amount of time.     Equipment Recommendations   BSC/3in1     Recommendations for Other Services         Precautions/Restrictions   Precautions Precautions: Fall Recall of Precautions/Restrictions: Intact Restrictions Weight Bearing Restrictions Per Provider Order: No     Mobility Bed Mobility Overal bed mobility: Modified Independent                  Transfers Overall transfer level: Needs assistance Equipment used: Rolling walker (2 wheels) Transfers: Sit to/from Stand Sit to Stand: Supervision, Min assist (supervision off ED strecher, MIN A off toilet)                  Balance Overall balance assessment: Needs assistance, History of Falls   Sitting balance-Leahy Scale: Normal     Standing balance support: Bilateral upper extremity supported, During functional activity Standing balance-Leahy Scale: Good  ADL either performed or assessed with clinical judgement   ADL Overall ADL's : Needs assistance/impaired Eating/Feeding: Set up   Grooming: Set up           Upper Body Dressing : Set up       Toilet Transfer: Minimal assistance;Cueing for sequencing Toilet  Transfer Details (indicate cue type and reason): MIN A with multi modal cueing for STS off ED toilet- pt reports being scared- she has had to call EMS bc she cannot get off toilet recently; problem solving deficits noted; educated on use of DME at home Toileting- Architect and Hygiene: Supervision/safety;Sitting/lateral lean Toileting - Clothing Manipulation Details (indicate cue type and reason): continent urine     Functional mobility during ADLs: Supervision/safety;Rolling walker (2 wheels) (approx 15' two attempts with RW)       Vision Patient Visual Report: No change from baseline       Perception         Praxis         Pertinent Vitals/Pain Pain Assessment Pain Assessment: No/denies pain     Extremity/Trunk Assessment Upper Extremity Assessment Upper Extremity Assessment: Overall WFL for tasks assessed   Lower Extremity Assessment Lower Extremity Assessment: Generalized weakness       Communication Communication Communication: No apparent difficulties   Cognition Arousal: Alert Behavior During Therapy: WFL for tasks assessed/performed Cognition: Cognition impaired       Memory impairment (select all impairments): Working memory Attention impairment (select first level of impairment): Selective attention Executive functioning impairment (select all impairments): Problem solving, Reasoning OT - Cognition Comments: participated in [place], scoring 16/30                 Following commands: Intact       Cueing  General Comments   Cueing Techniques: Verbal cues  vss throughout   Exercises Other Exercises Other Exercises: edu re role of OT, role of rehab, discharge recommendations, safe ADL completion with AE/DME   Shoulder Instructions      Home Living Family/patient expects to be discharged to:: Private residence Living Arrangements: Alone Available Help at Discharge: Available PRN/intermittently;Family;Friend(s) Type of Home:  House Home Access: Level entry     Home Layout: One level     Bathroom Shower/Tub: Tub/shower unit;Walk-in shower   Bathroom Toilet: Standard     Home Equipment: Cane - single Librarian, academic (2 wheels);Rollator (4 wheels);Shower seat;Grab bars - tub/shower;Hand held shower head          Prior Functioning/Environment Prior Level of Function : Independent/Modified Independent;Driving;Patient poor historian/Family not available             Mobility Comments: pt reports MOD I amb with rollator, fall history; ADLs Comments: pt report MOD I- I with ADL/IADL but reports recently unable to take meds, drive to grocery store because she just doesn't feel like it    OT Problem List: Decreased activity tolerance;Decreased knowledge of use of DME or AE;Impaired balance (sitting and/or standing);Decreased safety awareness;Decreased cognition   OT Treatment/Interventions: Self-care/ADL training;Energy conservation;Balance training;Therapeutic exercise;DME and/or AE instruction;Therapeutic activities;Patient/family education      OT Goals(Current goals can be found in the care plan section)   Acute Rehab OT Goals Patient Stated Goal: go home OT Goal Formulation: With patient Time For Goal Achievement: 07/03/24 Potential to Achieve Goals: Good ADL Goals Pt Will Perform Grooming: with modified independence;sitting;standing Pt Will Perform Lower Body Dressing: with modified independence;sitting/lateral leans;sit to/from stand Pt Will Transfer to Toilet: with modified independence;ambulating Pt Will  Perform Toileting - Clothing Manipulation and hygiene: with modified independence;sitting/lateral leans;sit to/from stand Additional ADL Goal #1: pt will participate in pill box assessment to further assess IADL performance   OT Frequency:  Min 2X/week    Co-evaluation              AM-PAC OT 6 Clicks Daily Activity     Outcome Measure Help from another person eating  meals?: None Help from another person taking care of personal grooming?: None Help from another person toileting, which includes using toliet, bedpan, or urinal?: None Help from another person bathing (including washing, rinsing, drying)?: A Little Help from another person to put on and taking off regular upper body clothing?: None Help from another person to put on and taking off regular lower body clothing?: A Little 6 Click Score: 22   End of Session Equipment Utilized During Treatment: Rolling walker (2 wheels) Nurse Communication: Mobility status (team re: eval findings)  Activity Tolerance: Patient tolerated treatment well Patient left: in bed;with call bell/phone within reach  OT Visit Diagnosis: Other abnormalities of gait and mobility (R26.89)                Time: 9071-9054 OT Time Calculation (min): 17 min Charges:  OT General Charges $OT Visit: 1 Visit OT Evaluation $OT Eval Moderate Complexity: 1 Mod Therisa Sheffield, OTD OTR/L  06/19/24, 10:37 AM

## 2024-06-19 NOTE — ED Provider Notes (Addendum)
 Notified by PT and OT that patient had been evaluated. Patient moving well, they do not recommend rehab.  Did recommend home health services with bedside commode.  Occupational Therapy team did note that patient performed poorly on their cognitive screening with concerns for dementia.  Patient evaluated, awake, alert.  She tells me that she does not want to be placed in a facility and does demonstrate decision-making capacity currently.  Spoke with patient's sister, Pamela Smith over the phone who  did have further questions about patient returning home, understands concern for dementia based on screening here.  Spoke with our social worker who will contact the sister to provide additional information.  Order for bedside commode and home health services placed.  Reports she has access to all of her medications at home.  Strict return precautions provided.  Patient discharged in stable condition.    Levander Slate, MD 06/19/24 [phone]

## 2024-06-19 NOTE — Progress Notes (Signed)
   06/19/24 1555  Spiritual Encounters  Type of Visit Initial  Care provided to: Pt and family  Conversation partners present during encounter Nurse  Referral source Patient request  Reason for visit Advance directives  Spiritual Framework  Presenting Themes Significant life change  Interventions  Spiritual Care Interventions Made Established relationship of care and support  Intervention Outcomes  Outcomes Connection to spiritual care   Pt requested chaplain to come and pt asked for advance directives. Chaplain left advance directives with pt.

## 2024-06-19 NOTE — ED Notes (Signed)
 Pt sister Devere on the way to get pt  [phone]

## 2024-06-19 NOTE — Inpatient Diabetes Management (Signed)
 Inpatient Diabetes Program Recommendations  AACE/ADA: New Consensus Statement on Inpatient Glycemic Control   Target Ranges:  Prepandial:   less than 140 mg/dL      Peak postprandial:   less than 180 mg/dL (1-2 hours)      Critically ill patients:  140 - 180 mg/dL    Latest Reference Range & Units 06/18/24 16:11 06/18/24 17:45 06/18/24 19:56 06/18/24 22:30 06/19/24 00:08 06/19/24 05:26 06/19/24 08:18  Glucose-Capillary 70 - 99 mg/dL 459 (HH) 548 (H) 581 (H) 447 (H) 470 (H) 376 (H) 313 (H)    Latest Reference Range & Units 06/18/24 15:11  CO2 22 - 32 mmol/L 25  Glucose 70 - 99 mg/dL 408 (HH)  Anion gap 5 - 15  13   Review of Glycemic Control  Diabetes history: DM2 Outpatient Diabetes medications: Lantus  50 units daily, Metformin  1000 mg BID, Januvia 100 mg daily Current orders for Inpatient glycemic control: Metformin  1000 mg BID, Lantus  50 units Q24H, Novolog  0-15 units TID with meals, Tradjenta  5 mg daily  Inpatient Diabetes Program Recommendations:    Insulin : Please consider adding Novolog  0-5 units at bedtime for bedtime correction and adding Novolog   5 units TID with meals for meal coverage if patient eats at least 50% of meals.  NOTE: Noted consult for diabetes coordinator for discharge recommendations. Patient currently in ED after wellness check and noted EMS was called 2 times on 9/4 due to weakness and patient needing help getting off the toilet.  Per H&P, patient has not taken medications in 1 month. Patient last seen PCP on 04/27/24 and A1C was 8.9% at that time.  Per ED note by J. Janet, RN on 9/4 at 8:45 pm patient's sisters are coming in from out of town today and planning to find placement for patient due to patient not being able to care for self at home safely.   Thanks, Earnie Gainer, RN, MSN, CDCES Diabetes Coordinator Inpatient Diabetes Program [phone] (Team Pager from 8am to 5pm)

## 2024-06-19 NOTE — ED Notes (Signed)
 Pt assisted with repositioning and breakfast tray set up

## 2024-06-19 NOTE — ED Notes (Signed)
Pt assisted to toilet 

## 2024-06-20 ENCOUNTER — Emergency Department

## 2024-06-20 ENCOUNTER — Inpatient Hospital Stay
Admission: EM | Admit: 2024-06-20 | Discharge: 2024-06-23 | DRG: 065 | Disposition: A | Discharge status: Skilled Nursing Facility | Attending: Internal Medicine | Admitting: Internal Medicine

## 2024-06-20 ENCOUNTER — Other Ambulatory Visit: Payer: Self-pay

## 2024-06-20 DIAGNOSIS — K219 Gastro-esophageal reflux disease without esophagitis: Secondary | ICD-10-CM | POA: Diagnosis present

## 2024-06-20 DIAGNOSIS — Z803 Family history of malignant neoplasm of breast: Secondary | ICD-10-CM | POA: Diagnosis not present

## 2024-06-20 DIAGNOSIS — N183 Chronic kidney disease, stage 3 unspecified: Secondary | ICD-10-CM

## 2024-06-20 DIAGNOSIS — Z79890 Hormone replacement therapy (postmenopausal): Secondary | ICD-10-CM

## 2024-06-20 DIAGNOSIS — Z87891 Personal history of nicotine dependence: Secondary | ICD-10-CM | POA: Diagnosis not present

## 2024-06-20 DIAGNOSIS — I10 Essential (primary) hypertension: Secondary | ICD-10-CM | POA: Diagnosis not present

## 2024-06-20 DIAGNOSIS — E1165 Type 2 diabetes mellitus with hyperglycemia: Secondary | ICD-10-CM | POA: Diagnosis present

## 2024-06-20 DIAGNOSIS — I129 Hypertensive chronic kidney disease with stage 1 through stage 4 chronic kidney disease, or unspecified chronic kidney disease: Secondary | ICD-10-CM | POA: Diagnosis present

## 2024-06-20 DIAGNOSIS — Z79899 Other long term (current) drug therapy: Secondary | ICD-10-CM | POA: Diagnosis not present

## 2024-06-20 DIAGNOSIS — F419 Anxiety disorder, unspecified: Secondary | ICD-10-CM | POA: Diagnosis present

## 2024-06-20 DIAGNOSIS — N1831 Chronic kidney disease, stage 3a: Secondary | ICD-10-CM | POA: Diagnosis present

## 2024-06-20 DIAGNOSIS — E119 Type 2 diabetes mellitus without complications: Secondary | ICD-10-CM

## 2024-06-20 DIAGNOSIS — I517 Cardiomegaly: Secondary | ICD-10-CM | POA: Diagnosis not present

## 2024-06-20 DIAGNOSIS — E039 Hypothyroidism, unspecified: Secondary | ICD-10-CM | POA: Diagnosis present

## 2024-06-20 DIAGNOSIS — Z794 Long term (current) use of insulin: Secondary | ICD-10-CM | POA: Diagnosis not present

## 2024-06-20 DIAGNOSIS — D72829 Elevated white blood cell count, unspecified: Secondary | ICD-10-CM | POA: Diagnosis present

## 2024-06-20 DIAGNOSIS — I639 Cerebral infarction, unspecified: Secondary | ICD-10-CM | POA: Diagnosis present

## 2024-06-20 DIAGNOSIS — E1122 Type 2 diabetes mellitus with diabetic chronic kidney disease: Secondary | ICD-10-CM | POA: Diagnosis present

## 2024-06-20 DIAGNOSIS — Z7984 Long term (current) use of oral hypoglycemic drugs: Secondary | ICD-10-CM

## 2024-06-20 DIAGNOSIS — T796XXA Traumatic ischemia of muscle, initial encounter: Secondary | ICD-10-CM | POA: Diagnosis not present

## 2024-06-20 DIAGNOSIS — M6282 Rhabdomyolysis: Secondary | ICD-10-CM | POA: Diagnosis present

## 2024-06-20 DIAGNOSIS — R54 Age-related physical debility: Secondary | ICD-10-CM | POA: Diagnosis present

## 2024-06-20 DIAGNOSIS — E785 Hyperlipidemia, unspecified: Secondary | ICD-10-CM | POA: Diagnosis present

## 2024-06-20 DIAGNOSIS — R296 Repeated falls: Secondary | ICD-10-CM | POA: Diagnosis present

## 2024-06-20 DIAGNOSIS — I6389 Other cerebral infarction: Principal | ICD-10-CM | POA: Diagnosis present

## 2024-06-20 DIAGNOSIS — Z91148 Patient's other noncompliance with medication regimen for other reason: Secondary | ICD-10-CM | POA: Diagnosis not present

## 2024-06-20 DIAGNOSIS — I5030 Unspecified diastolic (congestive) heart failure: Secondary | ICD-10-CM | POA: Diagnosis not present

## 2024-06-20 DIAGNOSIS — F0393 Unspecified dementia, unspecified severity, with mood disturbance: Secondary | ICD-10-CM | POA: Diagnosis present

## 2024-06-20 DIAGNOSIS — Z7982 Long term (current) use of aspirin: Secondary | ICD-10-CM

## 2024-06-20 DIAGNOSIS — F32A Depression, unspecified: Secondary | ICD-10-CM | POA: Diagnosis present

## 2024-06-20 LAB — COMPREHENSIVE METABOLIC PANEL WITH GFR
ALT: 39 U/L (ref 0–44)
AST: 48 U/L — ABNORMAL HIGH (ref 15–41)
Albumin: 3.5 g/dL (ref 3.5–5.0)
Alkaline Phosphatase: 66 U/L (ref 38–126)
Anion gap: 13 (ref 5–15)
BUN: 21 mg/dL (ref 8–23)
CO2: 22 mmol/L (ref 22–32)
Calcium: 9.2 mg/dL (ref 8.9–10.3)
Chloride: 94 mmol/L — ABNORMAL LOW (ref 98–111)
Creatinine, Ser: 1.22 mg/dL — ABNORMAL HIGH (ref 0.44–1.00)
GFR, Estimated: 46 mL/min — ABNORMAL LOW (ref >60)
Glucose, Bld: 357 mg/dL — ABNORMAL HIGH (ref 70–99)
Potassium: 4.3 mmol/L (ref 3.5–5.1)
Sodium: 129 mmol/L — ABNORMAL LOW (ref 135–145)
Total Bilirubin: 1.4 mg/dL — ABNORMAL HIGH (ref 0.0–1.2)
Total Protein: 7.2 g/dL (ref 6.5–8.1)

## 2024-06-20 LAB — CBC
HCT: 39.3 % (ref 36.0–46.0)
Hemoglobin: 12.9 g/dL (ref 12.0–15.0)
MCH: 27.7 pg (ref 26.0–34.0)
MCHC: 32.8 g/dL (ref 30.0–36.0)
MCV: 84.5 fL (ref 80.0–100.0)
Platelets: 295 K/uL (ref 150–400)
RBC: 4.65 MIL/uL (ref 3.87–5.11)
RDW: 15.6 % — ABNORMAL HIGH (ref 11.5–15.5)
WBC: 15.6 K/uL — ABNORMAL HIGH (ref 4.0–10.5)
nRBC: 0 % (ref 0.0–0.2)

## 2024-06-20 LAB — TSH: TSH: 3.436 u[IU]/mL (ref 0.350–4.500)

## 2024-06-20 LAB — GLUCOSE, CAPILLARY: Glucose-Capillary: 249 mg/dL — ABNORMAL HIGH (ref 70–99)

## 2024-06-20 MED ORDER — INSULIN ASPART 100 UNIT/ML IJ SOLN
0.0000 [IU] | Freq: Every day | INTRAMUSCULAR | Status: DC
  Administered 2024-06-20: 5 [IU] via SUBCUTANEOUS
  Administered 2024-06-21: 2 [IU] via SUBCUTANEOUS
  Filled 2024-06-20 (×2): qty 1

## 2024-06-20 MED ORDER — INSULIN GLARGINE 100 UNIT/ML ~~LOC~~ SOLN
30.0000 [IU] | Freq: Every day | SUBCUTANEOUS | Status: DC
  Administered 2024-06-20 – 2024-06-21 (×2): 30 [IU] via SUBCUTANEOUS
  Filled 2024-06-20 (×3): qty 0.3

## 2024-06-20 MED ORDER — SODIUM CHLORIDE 0.9 % IV SOLN: INTRAVENOUS | Status: DC

## 2024-06-20 MED ORDER — ALBUTEROL SULFATE (2.5 MG/3ML) 0.083% IN NEBU: 3.0000 mL | INHALATION_SOLUTION | Freq: Four times a day (QID) | RESPIRATORY_TRACT | Status: DC | PRN

## 2024-06-20 MED ORDER — ACETAMINOPHEN 160 MG/5ML PO SOLN
650.0000 mg | ORAL | Status: DC | PRN
Start: 2024-06-20 — End: 2024-06-23

## 2024-06-20 MED ORDER — LORAZEPAM 0.5 MG PO TABS: 0.5000 mg | ORAL_TABLET | Freq: Four times a day (QID) | ORAL | Status: DC | PRN

## 2024-06-20 MED ORDER — HEPARIN SODIUM (PORCINE) 5000 UNIT/ML IJ SOLN
5000.0000 [IU] | Freq: Two times a day (BID) | INTRAMUSCULAR | Status: DC
  Administered 2024-06-20 – 2024-06-23 (×7): 5000 [IU] via SUBCUTANEOUS
  Filled 2024-06-20 (×7): qty 1

## 2024-06-20 MED ORDER — ASPIRIN 81 MG PO CHEW
81.0000 mg | CHEWABLE_TABLET | Freq: Every day | ORAL | Status: DC
  Administered 2024-06-20 – 2024-06-23 (×4): 81 mg via ORAL
  Filled 2024-06-20 (×5): qty 1

## 2024-06-20 MED ORDER — SENNOSIDES-DOCUSATE SODIUM 8.6-50 MG PO TABS: 1.0000 | ORAL_TABLET | Freq: Every evening | ORAL | Status: DC | PRN

## 2024-06-20 MED ORDER — ACETAMINOPHEN 650 MG RE SUPP: 650.0000 mg | RECTAL | Status: DC | PRN

## 2024-06-20 MED ORDER — FELODIPINE ER 5 MG PO TB24
10.0000 mg | ORAL_TABLET | Freq: Every day | ORAL | Status: DC
  Administered 2024-06-21 – 2024-06-23 (×3): 10 mg via ORAL
  Filled 2024-06-20 (×3): qty 2
  Filled 2024-06-20: qty 1

## 2024-06-20 MED ORDER — ACETAMINOPHEN 325 MG PO TABS: 650.0000 mg | ORAL_TABLET | ORAL | Status: DC | PRN

## 2024-06-20 MED ORDER — STROKE: EARLY STAGES OF RECOVERY BOOK: Freq: Once | Status: AC

## 2024-06-20 MED ORDER — PANTOPRAZOLE SODIUM 40 MG PO TBEC
40.0000 mg | DELAYED_RELEASE_TABLET | Freq: Every day | ORAL | Status: DC
  Administered 2024-06-21 – 2024-06-23 (×3): 40 mg via ORAL
  Filled 2024-06-20 (×3): qty 1

## 2024-06-20 MED ORDER — INSULIN ASPART 100 UNIT/ML IJ SOLN
0.0000 [IU] | Freq: Three times a day (TID) | INTRAMUSCULAR | Status: DC
  Administered 2024-06-20: 11 [IU] via SUBCUTANEOUS
  Administered 2024-06-21: 3 [IU] via SUBCUTANEOUS
  Administered 2024-06-21 (×2): 5 [IU] via SUBCUTANEOUS
  Administered 2024-06-22: 11 [IU] via SUBCUTANEOUS
  Administered 2024-06-22: 3 [IU] via SUBCUTANEOUS
  Administered 2024-06-22: 5 [IU] via SUBCUTANEOUS
  Filled 2024-06-20 (×7): qty 1

## 2024-06-20 MED ORDER — HYDRALAZINE HCL 20 MG/ML IJ SOLN: 5.0000 mg | Freq: Four times a day (QID) | INTRAMUSCULAR | Status: DC | PRN

## 2024-06-20 MED ORDER — BUPROPION HCL ER (SR) 150 MG PO TB12
150.0000 mg | ORAL_TABLET | Freq: Two times a day (BID) | ORAL | Status: DC
  Administered 2024-06-20 – 2024-06-23 (×6): 150 mg via ORAL
  Filled 2024-06-20 (×6): qty 1

## 2024-06-20 MED ORDER — ATORVASTATIN CALCIUM 20 MG PO TABS
40.0000 mg | ORAL_TABLET | Freq: Every day | ORAL | Status: DC
  Administered 2024-06-20 – 2024-06-23 (×4): 40 mg via ORAL
  Filled 2024-06-20 (×5): qty 2

## 2024-06-20 MED ORDER — LEVOTHYROXINE SODIUM 75 MCG PO TABS
75.0000 ug | ORAL_TABLET | Freq: Every day | ORAL | Status: DC
  Administered 2024-06-21 – 2024-06-23 (×3): 75 ug via ORAL
  Filled 2024-06-20: qty 3
  Filled 2024-06-20 (×3): qty 1
  Filled 2024-06-20 (×2): qty 3

## 2024-06-20 NOTE — ED Provider Notes (Signed)
 [facility] Provider Note    Event Date/Time   First MD Initiated Contact with Patient 06/20/24 1147     (approximate)   History   Fall   HPI  Pamela Smith is a 78 y.o. female with a history of diabetes, hypertension who was just discharged yesterday with home health who presents after a fall.  Patient reports she fell between the toilet and the shower, complains primarily of right hand pain.  She does appear to have some bruising to her forehead also, not on blood thinners.  On review of records patient was in our emergency department for generalized weakness, had been seen by PT and OT as well as social work.  Patient denies new neuro deficit     Physical Exam   Triage Vital Signs: ED Triage Vitals  Encounter Vitals Group     BP 06/20/24 1155 (!) 151/91     Girls Systolic BP Percentile --      Girls Diastolic BP Percentile --      Boys Systolic BP Percentile --      Boys Diastolic BP Percentile --      Pulse Rate 06/20/24 1155 94     Resp 06/20/24 1155 18     Temp 06/20/24 1157 (!) 97.5 F (36.4 C)     Temp Source 06/20/24 1157 Rectal     SpO2 06/20/24 1155 96 %     Weight 06/20/24 1157 72 kg (158 lb 11.7 oz)     Height 06/20/24 1157 1.6 m (5' 3)     Head Circumference --      Peak Flow --      Pain Score 06/20/24 1155 4     Pain Loc --      Pain Education --      Exclude from Growth Chart --     Most recent vital signs: Vitals:   06/20/24 1155 06/20/24 1157  BP: (!) 151/91   Pulse: 94   Resp: 18   Temp:  (!) 97.5 F (36.4 C)  SpO2: 96%      General: Awake, no distress.  CV:  Good peripheral perfusion.  Resp:  Normal effort.  Abd:  No distention.  Other:  Mild bruising to the forehead, right hand has small abrasion and bruise to the dorsal surface  Strength in the upper extremities seems normal, on lower extremity exam more difficulty elevating the left leg but she is able to get off the bed  On exam complains of pain or  numbness in her right hand but she reports the strength is normal   ED Results / Procedures / Treatments   Labs (all labs ordered are listed, but only abnormal results are displayed) Labs Reviewed  CBC - Abnormal; Notable for the following components:      Result Value   WBC 15.6 (*)    RDW 15.6 (*)    All other components within normal limits  COMPREHENSIVE METABOLIC PANEL WITH GFR     EKG  ED ECG REPORT I, Lamar Price, the attending physician, personally viewed and interpreted this ECG.  Date: 06/20/2024  Rhythm: normal sinus rhythm QRS Axis: normal Intervals: normal ST/T Wave abnormalities: normal Narrative Interpretation: no evidence of acute ischemia    RADIOLOGY Contacted by radiologist and notified of acute to subacute CVA    PROCEDURES:  Critical Care performed:   Procedures   MEDICATIONS ORDERED IN ED: Medications - No data to display   IMPRESSION / MDM /  ASSESSMENT AND PLAN / ED COURSE  I reviewed the triage vital signs and the nursing notes. Patient's presentation is most consistent with exacerbation of chronic illness.  Patient presents after fall as detailed above, patient has a history of calling EMS for help getting off the toilet and for falls  She is in no distress today, requires significant assistance standing and pivoting to bed  Will check CT head, x-ray of the right hand, recheck basic labs and reevaluate  CT scan is consistent with acute to subacute CVA, will consult the hospitalist team for admission      FINAL CLINICAL IMPRESSION(S) / ED DIAGNOSES   Final diagnoses:  Cerebrovascular accident (CVA), unspecified mechanism (HCC)     Rx / DC Orders   ED Discharge Orders     None        Note:  This document was prepared using Dragon voice recognition software and may include unintentional dictation errors.   Arlander Charleston, MD 06/20/24 1247

## 2024-06-20 NOTE — ED Notes (Signed)
 Attempted to perform swallow screen, pt unable to stay awake while discussing instructions, unable to repeat back instructions, repeatedly closing eyes, responsive to verbal stimuli. Will attempt again at later time

## 2024-06-20 NOTE — ED Triage Notes (Signed)
 Pr to ED [facility] from home for fall last night, states unsure what caused fall.  Reports feeling cold.  States she is here because she cannot walk on her own  Knot noted to top left head

## 2024-06-20 NOTE — H&P (Signed)
 History and Physical    Pamela Smith:[id number] DOB: 1946-03-02 DOA: 06/20/2024  PCP: Sadie Manna, MD (Confirm with patient/family/NH records and if not entered, this has to be entered at [facility]-Main point of entry) Patient coming from: Home  I have personally briefly reviewed patient's old medical records in [facility] Link  Chief Complaint: Feeling weak  HPI: Pamela Smith is a 78 y.o. female with medical history significant of IDDM, HTN, hypothyroidism, HLD, GERD, chronic ambulation impairment, presented with frequent falls and generalized weakness.  Patient's sister at bedside gave history.  Sister reported patient lives by herself but since last year her ambulation has been gradually deteriorating, sister bought her a rolling walker herself to address the problem.  2 days ago, sister contacted patient and patient complained about feeling weakness and has had multiple falls at home.  Sister was concerned than drove to her house and found the patient on the floor and sister called EMS.  Patient denied any significant weakness numbness, admitted she fell and hit her forehead, but denied any LOC.  ED Course: Afebrile, no tachycardia blood pressure 150/90 not hypoxic.  CT head positive for acute versus subacute infarct in right caudate, Rourk showed sodium 129 glucose 357 BUN 21 creatinine 1.2.  Review of Systems: As per HPI otherwise 14 point review of systems negative.    Past Medical History:  Diagnosis Date   Anemia    Anxiety    Asthma without status asthmaticus    Depression    Diabetes mellitus without complication (HCC)    Dyslipidemia    Hyperlipidemia    Hypertension    Hypothyroidism     Past Surgical History:  Procedure Laterality Date   APPENDECTOMY     broken leg     COLONOSCOPY WITH PROPOFOL  N/A 11/21/2015   Procedure: COLONOSCOPY WITH PROPOFOL ;  Surgeon: Donnice Vaughn Manes, MD;  Location: [facility] ENDOSCOPY;  Service: Endoscopy;  Laterality: N/A;   COLONOSCOPY  WITH PROPOFOL  N/A 08/19/2019   Procedure: COLONOSCOPY WITH PROPOFOL ;  Surgeon: Toledo, Ladell POUR, MD;  Location: [facility] ENDOSCOPY;  Service: Gastroenterology;  Laterality: N/A;   ESOPHAGOGASTRODUODENOSCOPY (EGD) WITH PROPOFOL  N/A 11/21/2015   Procedure: ESOPHAGOGASTRODUODENOSCOPY (EGD) WITH PROPOFOL ;  Surgeon: Donnice Vaughn Manes, MD;  Location: [facility] ENDOSCOPY;  Service: Endoscopy;  Laterality: N/A;   ESOPHAGOGASTRODUODENOSCOPY (EGD) WITH PROPOFOL  N/A 08/19/2019   Procedure: ESOPHAGOGASTRODUODENOSCOPY (EGD) WITH PROPOFOL ;  Surgeon: Toledo, Ladell POUR, MD;  Location: [facility] ENDOSCOPY;  Service: Gastroenterology;  Laterality: N/A;   TONSILLECTOMY       reports that she has quit smoking. Her smoking use included cigarettes. She has never used smokeless tobacco. She reports current alcohol use of about 10.0 standard drinks of alcohol per week. She reports that she does not use drugs.  Allergies  Allergen Reactions   Amlodipine    Carvedilol    Felodipine  Swelling    Greater than 5 mg   Lisinopril     Family History  Problem Relation Age of Onset   Breast cancer Maternal Aunt      Prior to Admission medications   Medication Sig Start Date End Date Taking? Authorizing Provider  albuterol  (VENTOLIN  HFA) 108 (90 Base) MCG/ACT inhaler Inhale 2 puffs into the lungs every 6 (six) hours as needed for wheezing. 12/28/22 12/28/23  [provider]  aspirin  81 MG chewable tablet Chew by mouth daily.    [provider]  atorvastatin  (LIPITOR) 40 MG tablet Take 40 mg by mouth daily. 08/05/23   [provider]  buPROPion  (WELLBUTRIN  SR) 150 MG 12 hr tablet Take 150 mg by mouth 2 (two) times daily.    [provider]  Ca Phosphate-Cholecalciferol  (CALCIUM  WITH D3 PO) Take 1 capsule by mouth 2 (two) times daily.    [provider]  felodipine  (PLENDIL ) 10 MG 24 hr tablet Take 1 tablet (10 mg total) by mouth daily. 10/04/23   Von Bellis, MD  hydrALAZINE  (APRESOLINE )  50 MG tablet Take 1 tablet (50 mg total) by mouth every 8 (eight) hours as needed (SBP>150). 10/04/23   Von Bellis, MD  insulin  glargine (LANTUS ) 100 UNIT/ML injection Inject 50 Units into the skin as directed.     [provider]  isosorbide  mononitrate (IMDUR ) 60 MG 24 hr tablet Take 1 tablet (60 mg total) by mouth daily. 10/05/23   Von Bellis, MD  levothyroxine  (SYNTHROID , LEVOTHROID) 75 MCG tablet Take 75 mcg by mouth daily before breakfast.    [provider]  metFORMIN  (GLUCOPHAGE ) 1000 MG tablet Take 1,000 mg by mouth 2 (two) times daily with a meal.    [provider]  metoprolol  tartrate (LOPRESSOR ) 25 MG tablet Take 25 mg by mouth 2 (two) times daily.    [provider]  Omega-3 Fatty Acids (FISH OIL) 1000 MG CAPS Take 1 capsule by mouth 2 (two) times daily.    [provider]  omeprazole (PRILOSEC) 40 MG capsule Take 40 mg by mouth daily.    [provider]  [facility] VERIO test strip 1 each 2 (two) times daily. 09/17/20   [provider]  sitaGLIPtin (JANUVIA) 100 MG tablet Take 100 mg by mouth daily.    [provider]    Physical Exam: Vitals:   06/20/24 1155 06/20/24 1157  BP: (!) 151/91   Pulse: 94   Resp: 18   Temp:  (!) 97.5 F (36.4 C)  TempSrc:  Rectal  SpO2: 96%   Weight:  72 kg  Height:  5' 3 (1.6 m)    Constitutional: NAD, calm, comfortable Vitals:   06/20/24 1155 06/20/24 1157  BP: (!) 151/91   Pulse: 94   Resp: 18   Temp:  (!) 97.5 F (36.4 C)  TempSrc:  Rectal  SpO2: 96%   Weight:  72 kg  Height:  5' 3 (1.6 m)   Eyes: PERRL, lids and conjunctivae normal ENMT: Mucous membranes are moist. Posterior pharynx clear of any exudate or lesions.Normal dentition.  Neck: normal, supple, no masses, no thyromegaly Respiratory: clear to auscultation bilaterally, no wheezing, no crackles. Normal respiratory effort. No accessory muscle use.  Cardiovascular: Regular rate and rhythm, no  murmurs / rubs / gallops. No extremity edema. 2+ pedal pulses. No carotid bruits.  Abdomen: no tenderness, no masses palpated. No hepatosplenomegaly. Bowel sounds positive.  Musculoskeletal: no clubbing / cyanosis. No joint deformity upper and lower extremities. Good ROM, no contractures. Normal muscle tone.  Skin: no rashes, lesions, ulcers. No induration Neurologic: CN 2-12 grossly intact. Sensation intact, DTR normal. Strength 5/5 in all 4.  Psychiatric: Normal judgment and insight. Alert and oriented x 3. Normal mood.    Labs on Admission: I have personally reviewed following labs and imaging studies  CBC: Recent Labs  Lab 06/18/24 1802 06/20/24 1221  WBC 11.4* 15.6*  NEUTROABS 7.2  --   HGB 11.1* 12.9  HCT 34.8* 39.3  MCV 87.2 84.5  PLT 228 295   Basic Metabolic Panel: Recent Labs  Lab 06/18/24 1511 06/20/24 1221  NA 132* 129*  K 4.6  4.3  CL 94* 94*  CO2 25 22  GLUCOSE 591* 357*  BUN 16 21  CREATININE 1.21* 1.22*  CALCIUM  9.1 9.2  MG 1.6*  --    GFR: Estimated Creatinine Clearance: 36.7 mL/min (A) (by C-G formula based on SCr of 1.22 mg/dL (H)). Liver Function Tests: Recent Labs  Lab 06/18/24 1511 06/20/24 1221  AST 48* 48*  ALT 43 39  ALKPHOS 63 66  BILITOT 0.9 1.4*  PROT 6.5 7.2  ALBUMIN 3.4* 3.5   No results for input(s): LIPASE, AMYLASE in the last 168 hours. No results for input(s): AMMONIA in the last 168 hours. Coagulation Profile: No results for input(s): INR, PROTIME in the last 168 hours. Cardiac Enzymes: No results for input(s): CKTOTAL, CKMB, CKMBINDEX, TROPONINI in the last 168 hours. BNP (last 3 results) No results for input(s): PROBNP in the last 8760 hours. HbA1C: Recent Labs    06/18/24 1802  HGBA1C 11.1*   CBG: Recent Labs  Lab 06/18/24 2230 06/19/24 0008 06/19/24 0526 06/19/24 0818 06/19/24 1159  GLUCAP 447* 470* 376* 313* 217*   Lipid Profile: No results for input(s): CHOL, HDL, LDLCALC,  TRIG, CHOLHDL, LDLDIRECT in the last 72 hours. Thyroid  Function Tests: Recent Labs    06/18/24 1511  TSH 3.292  FREET4 1.13*   Anemia Panel: No results for input(s): VITAMINB12, FOLATE, FERRITIN, TIBC, IRON , RETICCTPCT in the last 72 hours. Urine analysis:    Component Value Date/Time   COLORURINE YELLOW (A) 06/18/2024 2002   APPEARANCEUR CLEAR (A) 06/18/2024 2002   APPEARANCEUR Clear 10/24/2013 2049   LABSPEC 1.023 06/18/2024 2002   LABSPEC 1.018 10/24/2013 2049   PHURINE 6.0 06/18/2024 2002   GLUCOSEU >=500 (A) 06/18/2024 2002   GLUCOSEU 50 mg/dL 98/89/7984 7950   HGBUR NEGATIVE 06/18/2024 2002   BILIRUBINUR NEGATIVE 06/18/2024 2002   BILIRUBINUR Negative 10/24/2013 2049   KETONESUR 5 (A) 06/18/2024 2002   PROTEINUR NEGATIVE 06/18/2024 2002   NITRITE NEGATIVE 06/18/2024 2002   LEUKOCYTESUR NEGATIVE 06/18/2024 2002   LEUKOCYTESUR Negative 10/24/2013 2049    Radiological Exams on Admission: DG Hand Complete Right Result Date: 06/20/2024 CLINICAL DATA:  Status post fall. EXAM: RIGHT HAND - COMPLETE 3+ VIEW COMPARISON:  None Available. FINDINGS: There is no evidence of fracture or dislocation. Degenerative changes are seen involving the first carpometacarpal joint. Soft tissues are unremarkable. IMPRESSION: Degenerative changes without evidence of an acute osseous abnormality. Electronically Signed   By: Suzen Dials M.D.   On: 06/20/2024 12:36   CT Head Wo Contrast Result Date: 06/20/2024 CLINICAL DATA:  Patient found down. EXAM: CT HEAD WITHOUT CONTRAST TECHNIQUE: Contiguous axial images were obtained from the base of the skull through the vertex without intravenous contrast. RADIATION DOSE REDUCTION: This exam was performed according to the departmental dose-optimization program which includes automated exposure control, adjustment of the mA and/or kV according to patient size and/or use of iterative reconstruction technique. COMPARISON:  September 23, 2023  FINDINGS: Brain: There is generalized cerebral atrophy with widening of the extra-axial spaces and ventricular dilatation. There are areas of decreased attenuation within the white matter tracts of the supratentorial brain, consistent with microvascular disease changes. And area of white matter low attenuation is seen involving the right caudate head and adjacent portion of the anterior aspect of the basal nuclei on the right. This represents a new finding when compared to the prior study. There is no associated midline shift. Vascular: Moderate to marked severity bilateral cavernous carotid artery calcification is noted. Skull: Normal. Negative  for fracture or focal lesion. Sinuses/Orbits: No acute finding. Other: There is mild right frontal scalp soft tissue swelling. IMPRESSION: 1. Generalized cerebral atrophy with widening of the extra-axial spaces and ventricular dilatation. 2. Findings consistent with an acute to subacute infarct involving the right caudate head and adjacent portion of the anterior aspect of the basal nuclei on the right. MRI correlation is recommended. 3. Mild right frontal scalp soft tissue swelling. Electronically Signed   By: Suzen Dials M.D.   On: 06/20/2024 12:31    EKG: Independently reviewed.  Sinus, chronic Carlis changes in V1 and V2.  Assessment/Plan Principal Problem:   Stroke [facility]) Active Problems:   Stroke (cerebrum) (HCC)  (please populate well all problems here in Problem List. (For example, if patient is on BP meds at home and you resume or decide to hold them, it is a problem that needs to be her. Same for CAD, COPD, HLD and so on)  Stroke Acute on chronic ambulation impairment - Brain MRI, MRA - Not a candidate for TNK due to out of window. Etiology likely secondary to noncompliant with BP and diabetes medication and aspirin  for more than a month. - Resume aspirin , statin and diabetic medications - PT OT evaluation - Echocardiogram - Allow permissive  hypertension for 1 day.  IDDM - Lantus  - SSI - Hold off metformin   HT - Hold off home BP meds - As needed hydralazine   Hypothyroidism - Continue Synthroid   DVT prophylaxis: Lovenox Code Status: Full code Family Communication: Sister at bedside Disposition Plan: Patient sick with acute stroke and ambulation impairment requiring inpatient stroke workup, expect more than 2 midnights of stay Consults called: None Admission status: Telemetry admission   Cort ONEIDA Mana MD [facility] Pager [phone]  06/20/2024, 1:44 PM

## 2024-06-20 NOTE — ED Notes (Signed)
 First nurse note: AEMS from home, found today by daughter on floor in bathroom, unknown down time Pt was unable to tell EMS when she fell or what time it is. AMS is new since the fall. Hematoma to forehead. Released from hospital yesterday. Very unsteady on feet per EMS.  No thinners, takes aspirin   L wrist pain and bruising  Hx DM, CBG 340, no insulin  today, EMS could not get IV access  EMS VS: T 97 axillary, HR 100, 99% RA, etCo2 32, RR 18, 141/86 hx HTN

## 2024-06-20 NOTE — ED Notes (Signed)
 Pt in MRI.

## 2024-06-21 ENCOUNTER — Inpatient Hospital Stay
Admit: 2024-06-21 | Discharge: 2024-06-21 | Disposition: A | Discharge status: Home / Self Care | Attending: Internal Medicine | Admitting: Internal Medicine

## 2024-06-21 ENCOUNTER — Inpatient Hospital Stay

## 2024-06-21 DIAGNOSIS — I10 Essential (primary) hypertension: Secondary | ICD-10-CM

## 2024-06-21 DIAGNOSIS — N1831 Chronic kidney disease, stage 3a: Secondary | ICD-10-CM

## 2024-06-21 DIAGNOSIS — R296 Repeated falls: Secondary | ICD-10-CM | POA: Diagnosis not present

## 2024-06-21 DIAGNOSIS — T796XXA Traumatic ischemia of muscle, initial encounter: Secondary | ICD-10-CM | POA: Diagnosis not present

## 2024-06-21 DIAGNOSIS — I517 Cardiomegaly: Secondary | ICD-10-CM

## 2024-06-21 DIAGNOSIS — E119 Type 2 diabetes mellitus without complications: Secondary | ICD-10-CM | POA: Diagnosis not present

## 2024-06-21 DIAGNOSIS — F32A Depression, unspecified: Secondary | ICD-10-CM

## 2024-06-21 DIAGNOSIS — E039 Hypothyroidism, unspecified: Secondary | ICD-10-CM

## 2024-06-21 DIAGNOSIS — N183 Chronic kidney disease, stage 3 unspecified: Secondary | ICD-10-CM

## 2024-06-21 DIAGNOSIS — I639 Cerebral infarction, unspecified: Secondary | ICD-10-CM | POA: Diagnosis not present

## 2024-06-21 DIAGNOSIS — E785 Hyperlipidemia, unspecified: Secondary | ICD-10-CM | POA: Diagnosis not present

## 2024-06-21 DIAGNOSIS — I5030 Unspecified diastolic (congestive) heart failure: Secondary | ICD-10-CM

## 2024-06-21 LAB — CBC
HCT: 40.4 % (ref 36.0–46.0)
Hemoglobin: 12.9 g/dL (ref 12.0–15.0)
MCH: 27.1 pg (ref 26.0–34.0)
MCHC: 31.9 g/dL (ref 30.0–36.0)
MCV: 84.9 fL (ref 80.0–100.0)
Platelets: 304 K/uL (ref 150–400)
RBC: 4.76 MIL/uL (ref 3.87–5.11)
RDW: 16 % — ABNORMAL HIGH (ref 11.5–15.5)
WBC: 11.3 K/uL — ABNORMAL HIGH (ref 4.0–10.5)
nRBC: 0 % (ref 0.0–0.2)

## 2024-06-21 LAB — LIPID PANEL
Cholesterol: 210 mg/dL — ABNORMAL HIGH (ref 0–200)
HDL: 36 mg/dL — ABNORMAL LOW (ref >40)
LDL Cholesterol: 129 mg/dL — ABNORMAL HIGH (ref 0–99)
Total CHOL/HDL Ratio: 5.8 ratio
Triglycerides: 225 mg/dL — ABNORMAL HIGH (ref <150)
VLDL: 45 mg/dL — ABNORMAL HIGH (ref 0–40)

## 2024-06-21 LAB — COMPREHENSIVE METABOLIC PANEL WITH GFR
ALT: 38 U/L (ref 0–44)
AST: 73 U/L — ABNORMAL HIGH (ref 15–41)
Albumin: 3.3 g/dL — ABNORMAL LOW (ref 3.5–5.0)
Alkaline Phosphatase: 59 U/L (ref 38–126)
Anion gap: 15 (ref 5–15)
BUN: 21 mg/dL (ref 8–23)
CO2: 22 mmol/L (ref 22–32)
Calcium: 9.1 mg/dL (ref 8.9–10.3)
Chloride: 98 mmol/L (ref 98–111)
Creatinine, Ser: 1.34 mg/dL — ABNORMAL HIGH (ref 0.44–1.00)
GFR, Estimated: 41 mL/min — ABNORMAL LOW (ref >60)
Glucose, Bld: 170 mg/dL — ABNORMAL HIGH (ref 70–99)
Potassium: 3.7 mmol/L (ref 3.5–5.1)
Sodium: 135 mmol/L (ref 135–145)
Total Bilirubin: 1 mg/dL (ref 0.0–1.2)
Total Protein: 6.6 g/dL (ref 6.5–8.1)

## 2024-06-21 LAB — GLUCOSE, CAPILLARY
Glucose-Capillary: 173 mg/dL — ABNORMAL HIGH (ref 70–99)
Glucose-Capillary: 224 mg/dL — ABNORMAL HIGH (ref 70–99)
Glucose-Capillary: 231 mg/dL — ABNORMAL HIGH (ref 70–99)
Glucose-Capillary: 213 mg/dL — ABNORMAL HIGH (ref 70–99)

## 2024-06-21 LAB — ECHOCARDIOGRAM COMPLETE
Height: 63 in
S' Lateral: 2.4 cm
Weight: 2539.7 [oz_av]

## 2024-06-21 LAB — CK: Total CK: 3362 U/L — ABNORMAL HIGH (ref 38–234)

## 2024-06-21 LAB — VITAMIN B12: Vitamin B-12: 238 pg/mL (ref 180–914)

## 2024-06-21 MED ORDER — CALCIUM WITH D3 250-12.5 MG-MCG PO CHEW: CHEWABLE_TABLET | Freq: Two times a day (BID) | ORAL | Status: DC

## 2024-06-21 MED ORDER — GADOBUTROL 1 MMOL/ML IV SOLN
7.0000 mL | Freq: Once | INTRAVENOUS | Status: AC | PRN
Start: 2024-06-21 — End: 2024-06-21
  Administered 2024-06-21: 7 mL via INTRAVENOUS

## 2024-06-21 MED ORDER — INSULIN ASPART 100 UNIT/ML IJ SOLN
5.0000 [IU] | Freq: Three times a day (TID) | INTRAMUSCULAR | Status: DC
  Administered 2024-06-21 – 2024-06-22 (×4): 5 [IU] via SUBCUTANEOUS
  Filled 2024-06-21 (×4): qty 1

## 2024-06-21 MED ORDER — LACTATED RINGERS IV SOLN: INTRAVENOUS | Status: DC

## 2024-06-21 NOTE — Consult Note (Signed)
 NEUROLOGY CONSULT NOTE   Date of service: June 21, 2024 Patient Name: Pamela Smith MRN:  [id number] DOB:  November 08, 1945 Chief Complaint: Falls Requesting Provider: Caleen Qualia, MD  History of Present Illness  Pamela Smith is a 78 y.o. female with hx of diabetes, dyslipidemia, hypertension and concerns for dementia who presents with increasing falls.  She was initially brought in by her [place] on 9/4, found to be noncompliant with her medications with severely elevated glucoses.  She was evaluated by PT and OT and went home, but then re-presented yesterday, again complaining of a more generalized type of weakness.  As part of her workup an MRI was obtained which does demonstrate a right basal ganglia stroke.  Patient denies any focal weakness, numbness.  She does state that she thinks she hit her head when she fell, and may have lost consciousness briefly.  LKW: Unclear IV Thrombolysis: Unclear time of onset  NIHSS components Score: Comment  1a Level of Conscious 0[]  1[]  2[]  3[]      1b LOC Questions 0[]  1[]  2[]       1c LOC Commands 0[]  1[]  2[]       2 Best Gaze 0[]  1[]  2[]       3 Visual 0[]  1[]  2[]  3[]      4 Facial Palsy 0[]  1[]  2[]  3[]      5a Motor Arm - left 0[]  1[]  2[]  3[]  4[]  UN[]    5b Motor Arm - Right 0[]  1[]  2[]  3[]  4[]  UN[]    6a Motor Leg - Left 0[]  1[]  2[]  3[]  4[]  UN[]    6b Motor Leg - Right 0[]  1[]  2[]  3[]  4[]  UN[]    7 Limb Ataxia 0[]  1[]  2[]  UN[]      8 Sensory 0[]  1[]  2[]  UN[]      9 Best Language 0[]  1[]  2[]  3[]      10 Dysarthria 0[]  1[]  2[]  UN[]      11 Extinct. and Inattention 0[]  1[]  2[]       TOTAL:      Past History   Past Medical History:  Diagnosis Date   Anemia    Anxiety    Asthma without status asthmaticus    Depression    Diabetes mellitus without complication (HCC)    Dyslipidemia    Hyperlipidemia    Hypertension    Hypothyroidism     Past Surgical History:  Procedure Laterality Date   APPENDECTOMY     broken leg      COLONOSCOPY WITH PROPOFOL  N/A 11/21/2015   Procedure: COLONOSCOPY WITH PROPOFOL ;  Surgeon: Donnice Vaughn Manes, MD;  Location: [facility] ENDOSCOPY;  Service: Endoscopy;  Laterality: N/A;   COLONOSCOPY WITH PROPOFOL  N/A 08/19/2019   Procedure: COLONOSCOPY WITH PROPOFOL ;  Surgeon: Toledo, Ladell POUR, MD;  Location: [facility] ENDOSCOPY;  Service: Gastroenterology;  Laterality: N/A;   ESOPHAGOGASTRODUODENOSCOPY (EGD) WITH PROPOFOL  N/A 11/21/2015   Procedure: ESOPHAGOGASTRODUODENOSCOPY (EGD) WITH PROPOFOL ;  Surgeon: Donnice Vaughn Manes, MD;  Location: [facility] ENDOSCOPY;  Service: Endoscopy;  Laterality: N/A;   ESOPHAGOGASTRODUODENOSCOPY (EGD) WITH PROPOFOL  N/A 08/19/2019   Procedure: ESOPHAGOGASTRODUODENOSCOPY (EGD) WITH PROPOFOL ;  Surgeon: Toledo, Ladell POUR, MD;  Location: [facility] ENDOSCOPY;  Service: Gastroenterology;  Laterality: N/A;   TONSILLECTOMY      Family History: Family History  Problem Relation Age of Onset   Breast cancer Maternal Aunt     Social History  reports that she has quit smoking. Her smoking use included cigarettes. She has never used smokeless tobacco. She reports current alcohol use of about 10.0 standard drinks of alcohol per  week. She reports that she does not use drugs.  Allergies  Allergen Reactions   Amlodipine    Carvedilol    Felodipine  Swelling    Greater than 5 mg   Lisinopril     Medications   Current Facility-Administered Medications:    0.9 %  sodium chloride  infusion, , Intravenous, Continuous, Laurita Cort DASEN, MD, Stopped at 06/21/24 0520   acetaminophen  (TYLENOL ) tablet 650 mg, 650 mg, Oral, Q4H PRN **OR** acetaminophen  (TYLENOL ) 160 MG/5ML solution 650 mg, 650 mg, Per Tube, Q4H PRN **OR** acetaminophen  (TYLENOL ) suppository 650 mg, 650 mg, Rectal, Q4H PRN, Laurita Cort T, MD   albuterol  (PROVENTIL ) (2.5 MG/3ML) 0.083% nebulizer solution 3 mL, 3 mL, Inhalation, Q6H PRN, Laurita Cort DASEN, MD   aspirin  chewable tablet 81 mg, 81 mg, Oral, Daily, Zhang, Ping T, MD, 81 mg at  06/21/24 0900   atorvastatin  (LIPITOR) tablet 40 mg, 40 mg, Oral, Daily, Zhang, Ping T, MD, 40 mg at 06/21/24 0900   buPROPion  (WELLBUTRIN  SR) 12 hr tablet 150 mg, 150 mg, Oral, BID, Zhang, Ping T, MD, 150 mg at 06/21/24 0900   felodipine  (PLENDIL ) 24 hr tablet 10 mg, 10 mg, Oral, Daily, Zhang, Ping T, MD, 10 mg at 06/21/24 0900   heparin  injection 5,000 Units, 5,000 Units, Subcutaneous, Q12H, Laurita Cort T, MD, 5,000 Units at 06/21/24 0901   hydrALAZINE  (APRESOLINE ) injection 5 mg, 5 mg, Intravenous, Q6H PRN, Laurita Cort T, MD   insulin  aspart (novoLOG ) injection 0-15 Units, 0-15 Units, Subcutaneous, TID WC, Laurita Cort DASEN, MD, 3 Units at 06/21/24 0901   insulin  aspart (novoLOG ) injection 0-5 Units, 0-5 Units, Subcutaneous, QHS, Zhang, Ping T, MD, 5 Units at 06/20/24 2136   insulin  glargine (LANTUS ) injection 30 Units, 30 Units, Subcutaneous, QHS, Zhang, Ping T, MD, 30 Units at 06/20/24 2048   levothyroxine  (SYNTHROID ) tablet 75 mcg, 75 mcg, Oral, QAC breakfast, Laurita Cort T, MD, 75 mcg at 06/21/24 0507   LORazepam  (ATIVAN ) tablet 0.5 mg, 0.5 mg, Oral, Q6H PRN, Laurita Cort DASEN, MD   pantoprazole  (PROTONIX ) EC tablet 40 mg, 40 mg, Oral, Daily, Laurita Cort T, MD, 40 mg at 06/21/24 0900   senna-docusate (Senokot-S) tablet 1 tablet, 1 tablet, Oral, QHS PRN, Laurita Cort T, MD  Vitals   Vitals:   06/20/24 2001 06/20/24 2354 06/21/24 0402 06/21/24 0737  BP: (!) 118/55 103/61 (!) 100/57 122/72  Pulse: 95 90 83 (!) 101  Resp: 18 18 18 16   Temp: 97.6 F (36.4 C) 98.5 F (36.9 C) 98.3 F (36.8 C) 98.6 F (37 C)  TempSrc:   Oral Oral  SpO2: 98% 97% 97% 97%  Weight:      Height:        Body mass index is 28.12 kg/m.   Physical Exam   Constitutional: Appears well-developed and well-nourished.  Neurologic Examination    Neuro: Mental Status: Patient is awake, alert, oriented to person, place, month, year, and situation. No signs of aphasia or neglect Cranial Nerves: II: Visual Fields  are full. Pupils are equal, round, and reactive to light.   III,IV, VI: EOMI without ptosis or diploplia.  V: Facial sensation is symmetric to temperature VII: Facial movement is with mild left facial weakness VIII: hearing is intact to voice X: Uvula elevates symmetrically XII: tongue is midline without atrophy or fasciculations.  Motor: She does not have any drift in either upper extremity, but to confrontation she has mild weakness of the left arm and leg Sensory: Sensation is symmetric  to light touch and temperature in the arms and legs. Cerebellar: FNF intact bilaterally        Labs/Imaging/Neurodiagnostic studies   CBC:  Recent Labs  Lab 03-Jul-2024 1802 06/20/24 1221 06/21/24 0937  WBC 11.4* 15.6* 11.3*  NEUTROABS 7.2  --   --   HGB 11.1* 12.9 12.9  HCT 34.8* 39.3 40.4  MCV 87.2 84.5 84.9  PLT 228 295 304   Basic Metabolic Panel:  Lab Results  Component Value Date   NA 129 (L) 06/20/2024   K 4.3 06/20/2024   CO2 22 06/20/2024   GLUCOSE 357 (H) 06/20/2024   BUN 21 06/20/2024   CREATININE 1.22 (H) 06/20/2024   CALCIUM  9.2 06/20/2024   GFRNONAA 46 (L) 06/20/2024   GFRAA >60 10/29/2013   Lipid Panel:  Lab Results  Component Value Date   LDLCALC 129 (H) 06/21/2024   HgbA1c:  Lab Results  Component Value Date   HGBA1C 11.1 (H) July 03, 2024   Alcohol Level     Component Value Date/Time   ETH <10 09/23/2023 1716     MRI Brain(Personally reviewed): Right basal ganglia abnormality most consistent with an infarct  MRA head and neck-Motion degraded   ASSESSMENT   Pamela Smith is a 78 y.o. female with a history of increasing falls, some concern for dementia as well who has an abnormality on her MRI most consistent with a subacute infarct.  She certainly is at high risk given uncontrolled diabetes, hyperlipidemia, hypertension.  Based on the appearance on MRI, my suspicion is we are outside of the 3-week dual antiplatelet window, and therefore I would  continue just aspirin  monotherapy  RECOMMENDATIONS  Aspirin  81 mg daily High intensity statin with Lipitor 40 mg daily Carotid Dopplers Echo, telemetry PT, OT, ST Check B12 MRI brain with contrast, if still indeterminate may need repeat imaging in a couple of months ______________________________________________________________________    Signed, Aisha Seals, MD [facility]

## 2024-06-21 NOTE — Plan of Care (Signed)
  Problem: Education: Goal: Ability to describe self-care measures that may prevent or decrease complications (Diabetes Survival Skills Education) will improve Outcome: Progressing   Problem: Metabolic: Goal: Ability to maintain appropriate glucose levels will improve Outcome: Progressing   Problem: Ischemic Stroke/TIA Tissue Perfusion: Goal: Complications of ischemic stroke/TIA will be minimized Outcome: Progressing

## 2024-06-21 NOTE — Evaluation (Addendum)
 Occupational Therapy Evaluation Patient Details Name: Pamela Smith MRN: [id number] DOB: [birth year] Today's Date: 06/21/2024   History of Present Illness   Pt is a 78 year old female admitted after fall, MRI showing  Signal abnormality in the anterior right basal ganglia most  compatible with a subacute infarct.      PMH significant for IDDM, HTN, hypothyroidism, HLD, GERD, chronic ambulation impairment; of note, pt was in ED on 9/5     Clinical Impressions Chart reviewed to date, pt greeted semi supine in bed, oriented to self and date. Of note, this patient is known to this therapist from previous presentation to the ED and her cognition appears more impaired (she scored 16/30 on the SLUMS on that day). PTA pt reports she is MOD I-I with ADL/IADl amb with rollator however how has had multiple falls. She presents with deficits in strength, endurance, activity tolerance, balance, cognition, affecting safe and optimal ADL completion. She requires physical assist for all mobility and LB dressing. Poor sequencing/dual tasking with fair-poor safety awareness. Please see further details below. Pt will benefit from acute OT to address deficits and to facilitate optimal ADL performance.      If plan is discharge home, recommend the following:   A little help with bathing/dressing/bathroom;Assist for transportation;Assistance with cooking/housework;Direct supervision/assist for medications management;Direct supervision/assist for financial management;Supervision due to cognitive status     Functional Status Assessment   Patient has had a recent decline in their functional status and demonstrates the ability to make significant improvements in function in a reasonable and predictable amount of time.     Equipment Recommendations         Recommendations for Other Services         Precautions/Restrictions   Precautions Precautions: Fall Recall of Precautions/Restrictions:  Impaired Restrictions Weight Bearing Restrictions Per Provider Order: No     Mobility Bed Mobility Overal bed mobility: Needs Assistance Bed Mobility: Supine to Sit     Supine to sit: HOB elevated, Mod assist          Transfers Overall transfer level: Needs assistance Equipment used: Rolling walker (2 wheels) Transfers: Sit to/from Stand Sit to Stand: Mod assist (+1-2)                  Balance Overall balance assessment: Needs assistance, History of Falls   Sitting balance-Leahy Scale: Good     Standing balance support: Bilateral upper extremity supported, During functional activity Standing balance-Leahy Scale: Fair                             ADL either performed or assessed with clinical judgement   ADL Overall ADL's : Needs assistance/impaired             Lower Body Bathing: Moderate assistance;Sitting/lateral leans       Lower Body Dressing: Minimal assistance;Sitting/lateral leans Lower Body Dressing Details (indicate cue type and reason): doff/donn socks Toilet Transfer: Minimal assistance;Rolling walker (2 wheels);Cueing for sequencing;Cueing for safety Toilet Transfer Details (indicate cue type and reason): simulated, multi modal cues with poor attention to task on this date, pt with incontient of urine while standing talking to her roommate Toileting- Architect and Hygiene: Moderate assistance;Sit to/from stand       Functional mobility during ADLs: Contact guard assist;Minimal assistance;Rolling walker (2 wheels);Cueing for safety;Cueing for sequencing (approx 12' forward and back)       Vision Patient Visual Report: No change  from baseline       Perception         Praxis         Pertinent Vitals/Pain Pain Assessment Pain Assessment: No/denies pain     Extremity/Trunk Assessment Upper Extremity Assessment Upper Extremity Assessment: Right hand dominant;LUE deficits/detail (RUE appears WFL) LUE  Deficits / Details: 4/5 on L shoulder, elbow, wrist 4+/5; mildly weak grip strength   Lower Extremity Assessment Lower Extremity Assessment: Defer to PT evaluation       Communication Communication Communication: No apparent difficulties   Cognition Arousal: Alert Behavior During Therapy: WFL for tasks assessed/performed, Flat affect Cognition: Cognition impaired   Orientation impairments: Place, Situation Awareness: Intellectual awareness impaired, Online awareness impaired Memory impairment (select all impairments): Working Civil [employer] fast streamer, Conservation officer, historic buildings Attention impairment (select first level of impairment): Sustained attention Executive functioning impairment (select all impairments): Organization, Sequencing, Reasoning, Problem solving OT - Cognition Comments: impaired cognition as compared to OT evaluation on 9/5                 Following commands: Impaired Following commands impaired: Follows one step commands with increased time     Cueing  General Comments   Cueing Techniques: Verbal cues;Tactile cues;Visual cues  vss throughout   Exercises Other Exercises Other Exercises: edu re role of OT, role of rehab   Shoulder Instructions      Home Living Family/patient expects to be discharged to:: Private residence Living Arrangements: Alone Available Help at Discharge: Available PRN/intermittently;Family;Friend(s) Type of Home: House Home Access: Level entry     Home Layout: One level     Bathroom Shower/Tub: Tub/shower unit;Walk-in shower   Bathroom Toilet: Standard     Home Equipment: Cane - single Librarian, academic (2 wheels);Rollator (4 wheels);Shower seat;Grab bars - tub/shower;Hand held shower head          Prior Functioning/Environment Prior Level of Function : Independent/Modified Independent;Driving;Patient poor historian/Family not available             Mobility Comments: pt reports MOD I amb with rollator; fell in  bathroom which prompted return to ED ADLs Comments: pt report MOD I- I with ADL/IADL but reports recently unable to take meds, drive to grocery store because she just doesn't feel like it    OT Problem List: Decreased activity tolerance;Decreased knowledge of use of DME or AE;Impaired balance (sitting and/or standing);Decreased safety awareness;Decreased cognition   OT Treatment/Interventions: Self-care/ADL training;Energy conservation;Balance training;Therapeutic exercise;DME and/or AE instruction;Therapeutic activities;Patient/family education      OT Goals(Current goals can be found in the care plan section)   Acute Rehab OT Goals Patient Stated Goal: improve function OT Goal Formulation: With patient Time For Goal Achievement: 07/03/24 Potential to Achieve Goals: Good ADL Goals Pt Will Perform Grooming: with modified independence;sitting;standing Pt Will Perform Lower Body Dressing: with modified independence;sitting/lateral leans;sit to/from stand Pt Will Transfer to Toilet: with modified independence;ambulating Pt Will Perform Toileting - Clothing Manipulation and hygiene: with modified independence;sitting/lateral leans;sit to/from stand   OT Frequency:  Min 2X/week    Co-evaluation PT/OT/SLP Co-Evaluation/Treatment: Yes Reason for Co-Treatment: Complexity of the patient's impairments (multi-system involvement) PT goals addressed during session: Mobility/safety with mobility OT goals addressed during session: ADL's and self-care      AM-PAC OT 6 Clicks Daily Activity     Outcome Measure Help from another person eating meals?: None Help from another person taking care of personal grooming?: None Help from another person toileting, which includes using toliet, bedpan, or urinal?: None Help from  another person bathing (including washing, rinsing, drying)?: A Lot Help from another person to put on and taking off regular upper body clothing?: A Little Help from another  person to put on and taking off regular lower body clothing?: A Lot 6 Click Score: 19   End of Session Equipment Utilized During Treatment: Rolling walker (2 wheels) Nurse Communication: Mobility status  Activity Tolerance: Patient tolerated treatment well Patient left: in chair;with call bell/phone within reach;with chair alarm set (neurologist present)  OT Visit Diagnosis: Other abnormalities of gait and mobility (R26.89);Other symptoms and signs involving cognitive function                Time: 9092-9071 OT Time Calculation (min): 21 min Charges:  OT General Charges $OT Visit: 1 Visit OT Evaluation $OT Eval Moderate Complexity: 1 Mod Therisa Sheffield, OTD OTR/L  06/21/24, 10:26 AM

## 2024-06-21 NOTE — Assessment & Plan Note (Signed)
 Baseline creatinine seems to be around 1.1-1.2 Slight increase in creatinine to 1.34 but not meeting the criteria for AKI. - Giving some IV fluid -Monitor renal function - Avoid nephrotoxins

## 2024-06-21 NOTE — Hospital Course (Addendum)
 Partly taken from H&P.  Pamela Smith is a 78 y.o. female with medical history significant of IDDM, HTN, hypothyroidism, HLD, GERD, chronic ambulation impairment, presented with frequent falls and generalized weakness.   Patient was seen in ED on 9/4 with significant hyperglycemia, apparently she was not taking any medications.  PT and OT did not recommend SNF at that time and patient also does not want to go to any facility so she was discharged back home.  There is also a concern of underlying dementia.  On presentation hemodynamically stable, labs with corrected sodium of 133, blood glucose of 357, T. bili of 1.4, creatinine at 1.22 which is around her baseline, leukocytosis at 15.6. CT head with a concern of acute to subacute infarct involving the right caudate head and adjacent portion of the anterior aspect of basal nuclei on the right.  Generalized cerebral atrophy with widening of extra-axial spaces and ventricular dilatation.  Patient was admitted for stroke workup.  9/7: Vital stable, MRI with concern of a subacute anterior right basal ganglia infarct, moderate chronic small vessel disease. MRA head and neck was motion degraded but negative for any LVO, moderate to severe stenosis in the right P2 segment.  A1c checked on 9/4 was 11.1, lipid panel with elevated total cholesterol at 210, HDL 36, triglyceride 225 and LDL of 129.  CBG started improving and now at 173.  Patient was started on aspirin  and Lipitor.  Passed swallow evaluation.  Recent cognition evaluation is consistent with dementia. CK elevated at 3362, echocardiogram with normal EF and grade 1 diastolic dysfunction.  PT is recommending SNF MRI brain with contrast was ordered by neurology as her symptoms does not match with any acute stroke. Ordered some IV fluid for rhabdomyolysis.  9/8: Remained hemodynamically stable, MRI with contrast with a concern of subacute right basal ganglia lesion, neurology is recommending repeat  imaging in 23-month to ensure expected evolution.  Carotid ultrasound with less than 50% stenosis and mild atherosclerotic plaque formation bilaterally.  B12 low at 238. Neurology is recommending aspirin  and Lipitor along with B12 supplement.  She need to follow-up with outpatient neurology for further assistance. CK improving now at 1504. Obtained insurance authorization to go to rehab tomorrow.

## 2024-06-21 NOTE — Evaluation (Signed)
 Physical Therapy Evaluation Patient Details Name: Pamela Smith MRN: [id number] DOB: 1946/07/11 Today's Date: 06/21/2024  History of Present Illness  Pt is a 78 year old female admitted after fall, MRI showing  Signal abnormality in the anterior right basal ganglia most  compatible with a subacute infarct.      PMH significant for IDDM, HTN, hypothyroidism, HLD, GERD, chronic ambulation impairment; of note, pt was in ED on 9/5  Clinical Impression  Patient is agreeable to PT evaluation. She lives at home alone. Patient reports history of falls.  Today the patient has difficulty with sequencing and problem solving. She needs cues for safety with mobility. Increased assistance with mobility compared to recent hospital stay. Rehabilitation < 3 hours/day recommended after this hospital stay. PT will continue to follow.       If plan is discharge home, recommend the following: A little help with walking and/or transfers;A little help with bathing/dressing/bathroom;Assistance with cooking/housework;Supervision due to cognitive status;Help with stairs or ramp for entrance;Direct supervision/assist for medications management   Can travel by private vehicle   No    Equipment Recommendations None recommended by PT  Recommendations for Other Services       Functional Status Assessment Patient has had a recent decline in their functional status and demonstrates the ability to make significant improvements in function in a reasonable and predictable amount of time.     Precautions / Restrictions Precautions Precautions: Fall Recall of Precautions/Restrictions: Impaired Restrictions Weight Bearing Restrictions Per Provider Order: No      Mobility  Bed Mobility Overal bed mobility: Needs Assistance Bed Mobility: Supine to Sit     Supine to sit: HOB elevated, Mod assist     General bed mobility comments: increased time for processing. assistance for trunk support    Transfers Overall  transfer level: Needs assistance Equipment used: Rolling walker (2 wheels) Transfers: Sit to/from Stand Sit to Stand: Mod assist           General transfer comment: lifting and lowering assistance provided. cues for anterior weight shifting    Ambulation/Gait Ambulation/Gait assistance: Contact guard assist, Min assist Gait Distance (Feet): 12 Feet Assistive device: Rolling walker (2 wheels) Gait Pattern/deviations: Step-through pattern, Decreased stride length Gait velocity: decreased     General Gait Details: occasional steadying assistance required with taking steps backwards. recommend to continue using rolling walker for support. patient was incontinent of bladder with walking  Stairs            Wheelchair Mobility     Tilt Bed    Modified Rankin (Stroke Patients Only)       Balance Overall balance assessment: Needs assistance, History of Falls Sitting-balance support: Feet supported Sitting balance-Leahy Scale: Good     Standing balance support: Bilateral upper extremity supported, During functional activity Standing balance-Leahy Scale: Fair                               Pertinent Vitals/Pain Pain Assessment Pain Assessment: No/denies pain    Home Living Family/patient expects to be discharged to:: Private residence Living Arrangements: Alone Available Help at Discharge: Available PRN/intermittently;Family;Friend(s) Type of Home: House Home Access: Level entry       Home Layout: One level Home Equipment: Cane - single Librarian, academic (2 wheels);Rollator (4 wheels);Shower seat;Grab bars - tub/shower;Hand held shower head      Prior Function Prior Level of Function : Independent/Modified Independent;Driving;Patient poor historian/Family not available  Mobility Comments: pt reports MOD I amb with rollator; fell in bathroom which prompted return to ED ADLs Comments: pt report MOD I- I with ADL/IADL but reports  recently unable to take meds, drive to grocery store because she just doesn't feel like it     Extremity/Trunk Assessment   Upper Extremity Assessment Upper Extremity Assessment: Right hand dominant;Defer to OT evaluation LUE Deficits / Details: 4/5 on L shoulder, elbow, wrist 4+/5; mildly weak grip strength    Lower Extremity Assessment Lower Extremity Assessment: LLE deficits/detail LLE Deficits / Details: 4/5 knee extension, 5/5 dorsiflexion, plantarflexion, 4/5 hip flexion LLE Sensation: WNL       Communication   Communication Communication: No apparent difficulties    Cognition Arousal: Alert Behavior During Therapy: WFL for tasks assessed/performed, Flat affect   PT - Cognitive impairments: No family/caregiver present to determine baseline, Awareness, Memory, Initiation, Sequencing, Problem solving, Safety/Judgement                         Following commands: Impaired Following commands impaired: Follows one step commands with increased time     Cueing Cueing Techniques: Verbal cues, Tactile cues, Visual cues     General Comments General comments (skin integrity, edema, etc.): vss throughout    Exercises     Assessment/Plan    PT Assessment Patient needs continued PT services  PT Problem List Decreased strength;Decreased activity tolerance;Decreased balance;Decreased mobility;Decreased cognition;Decreased safety awareness       PT Treatment Interventions DME instruction;Gait training;Stair training;Functional mobility training;Therapeutic activities;Therapeutic exercise;Balance training;Neuromuscular re-education;Cognitive remediation;Patient/family education    PT Goals (Current goals can be found in the Care Plan section)  Acute Rehab PT Goals Patient Stated Goal: to go home PT Goal Formulation: With patient Time For Goal Achievement: 07/05/24 Potential to Achieve Goals: Fair    Frequency Min 2X/week     Co-evaluation PT/OT/SLP  Co-Evaluation/Treatment: Yes Reason for Co-Treatment: Complexity of the patient's impairments (multi-system involvement) PT goals addressed during session: Mobility/safety with mobility OT goals addressed during session: ADL's and self-care       AM-PAC PT 6 Clicks Mobility  Outcome Measure Help needed turning from your back to your side while in a flat bed without using bedrails?: None Help needed moving from lying on your back to sitting on the side of a flat bed without using bedrails?: A Lot Help needed moving to and from a bed to a chair (including a wheelchair)?: A Lot Help needed standing up from a chair using your arms (e.g., wheelchair or bedside chair)?: A Lot Help needed to walk in hospital room?: A Little Help needed climbing 3-5 steps with a railing? : A Lot 6 Click Score: 15    End of Session Equipment Utilized During Treatment: Gait belt Activity Tolerance: Patient tolerated treatment well Patient left: in chair;with call bell/phone within reach;with chair alarm set Nurse Communication: Mobility status PT Visit Diagnosis: Muscle weakness (generalized) (M62.81);Unsteadiness on feet (R26.81)    Time: 9092-9071 PT Time Calculation (min) (ACUTE ONLY): 21 min   Charges:   PT Evaluation $PT Eval Moderate Complexity: 1 Mod   PT General Charges $$ ACUTE PT VISIT: 1 Visit         Randine Essex, PT, MPT   Randine LULLA Essex 06/21/2024, 10:33 AM

## 2024-06-21 NOTE — Assessment & Plan Note (Signed)
 CK of 3362, likely secondary to frequent falls and staying on floor for a long time. - IV fluid -Monitor CK

## 2024-06-21 NOTE — TOC Progression Note (Addendum)
 Transition of Care [facility]) - Progression Note    Patient Details  Name: Pamela Smith MRN: [id number] Date of Birth: 1946-03-12  [facility] CM/SW Contact  Marinda Cooks, RN Phone Number: 06/21/2024, 2:36 PM  Clinical Narrative:    This CM spoke with pt introduced role and was granted permission to speak to sister Isidoro). This CM spoke with Devere and discussed dc plan. She expressed her concern regarding pt dc back home due to not having support in the home and having falls. This CM confirmed with MD pt is in agreement with SNF placement. This CM completed FL2 and sent referrals to review for bed offer. TOC will cont to follow dc planning / care coordination.                    Expected Discharge Plan and Services                                               Social Drivers of Health (SDOH) Interventions SDOH Screenings   Food Insecurity: No Food Insecurity (06/20/2024)  Housing: Low Risk  (06/21/2024)  Transportation Needs: No Transportation Needs (06/20/2024)  Utilities: Not At Risk (06/20/2024)  Financial Resource Strain: Low Risk  (08/23/2023)   Received from [facility]  Social Connections: Socially Isolated (06/21/2024)  Tobacco Use: Medium Risk (06/20/2024)    Readmission Risk Interventions    09/24/2023   11:01 AM  Readmission Risk Prevention Plan  Post Dischage Appt Complete  Medication Screening Complete  Transportation Screening Complete

## 2024-06-21 NOTE — Assessment & Plan Note (Signed)
 TSH level normal at 3.436 -Continue with Synthroid 

## 2024-06-21 NOTE — Progress Notes (Addendum)
 SLP Cancellation Note  Patient Details Name: Pamela Smith MRN: [id number] DOB: Dec 17, 1945   Cancelled treatment:       Reason Eval/Treat Not Completed:  (chart reviewed; consulted MD and NSG re: pt's status and POC moving forward. met w/ pt in room.)   Pt is a 78 y.o. female with medical history significant of Concern for Cognitive decline (per SLUMS performance on 06/19/2024: 16/30- recommendation for f/u w/ Neurology for formal assessment then), IDDM, HTN, hypothyroidism, ETOH use per chart, HLD, GERD, chronic ambulation impairment, presented with frequent falls and generalized weakness.   Patient's sister at bedside gave history.  Sister reported patient lives by herself, but since last year her ambulation has been gradually deteriorating.  Sister bought her a rolling walker herself to address the problem.  2 days ago, sister contacted patient and patient complained about feeling weakness and has had multiple falls at home.  Sister was concerned than drove to her house and found the patient on the floor and sister called EMS.  Family do Not want pt returning home alone- per NSG report.   Met w/ pt in room post return. Pt denied any difficulty swallowing and is currently on a regular diet; tolerates swallowing pills per NSG. She sipped on water via straw during this visit. Pt conversed in Basic conversation w/out overt expressive/receptive language deficits noted; speech clear. There is BASELINE, documented concern for Cognitive decline which could impact Safety in the home, meds, finances, etc. Her recent performance on the SLUMS indicates need for formal assessment and f/u by Neurology post Discharge. Pt is recommended to have 100% Supervision at Discharge for Safety reasons. This was conveyed to MD and NSG, pt.   No further skilled ST services indicated in an Acute setting. Recommend f/u at next venue of care as per Neurology assessment results/poc. Pt agreed. MD/NSG updated and agreed. MD to  reconsult if any decline in status while admitted.       Pamela Portugal, MS, CCC-SLP Speech Language Pathologist Rehab Services; [facility] [phone] (ascom) Zykiria Bruening 06/21/2024, 1:21 PM

## 2024-06-21 NOTE — Assessment & Plan Note (Signed)
 Blood pressure currently soft. Patient was on felodipine  at home which is currently being held. - Continue to monitor

## 2024-06-21 NOTE — Progress Notes (Signed)
 Progress Note   Patient: Pamela Smith FMW:[id number] DOB: Jun 04, 1946 DOA: 06/20/2024     1 DOS: the patient was seen and examined on 06/21/2024   Brief hospital course: Partly taken from H&P.  ABRINA PETZ is a 78 y.o. female with medical history significant of IDDM, HTN, hypothyroidism, HLD, GERD, chronic ambulation impairment, presented with frequent falls and generalized weakness.   Patient was seen in ED on 9/4 with significant hyperglycemia, apparently she was not taking any medications.  PT and OT did not recommend SNF at that time and patient also does not want to go to any facility so she was discharged back home.  There is also a concern of underlying dementia.  On presentation hemodynamically stable, labs with corrected sodium of 133, blood glucose of 357, T. bili of 1.4, creatinine at 1.22 which is around her baseline, leukocytosis at 15.6. CT head with a concern of acute to subacute infarct involving the right caudate head and adjacent portion of the anterior aspect of basal nuclei on the right.  Generalized cerebral atrophy with widening of extra-axial spaces and ventricular dilatation.  Patient was admitted for stroke workup.  9/7: Vital stable, MRI with concern of a subacute anterior right basal ganglia infarct, moderate chronic small vessel disease. MRA head and neck was motion degraded but negative for any LVO, moderate to severe stenosis in the right P2 segment.  A1c checked on 9/4 was 11.1, lipid panel with elevated total cholesterol at 210, HDL 36, triglyceride 225 and LDL of 129.  CBG started improving and now at 173.  Patient was started on aspirin  and Lipitor.  Passed swallow evaluation.  Recent cognition evaluation is consistent with dementia. CK elevated at 3362, echocardiogram with normal EF and grade 1 diastolic dysfunction.  PT is recommending SNF MRI brain with contrast was ordered by neurology as her symptoms does not match with any acute stroke. Ordered some IV  fluid for rhabdomyolysis  Assessment and Plan: * Stroke [facility]) MRI brain with concern of acute/subacute basal ganglia infarct but her symptoms are not consistent with any acute stroke.  Neurology ordered MRI brain with contrast.  MRA head and neck was negative for any LVO.  Completed the stroke workup Started on baby aspirin - -continue with Lipitor -Follow-up on MRI with contrast  Frequent falls Seems like progressive decline, cognition evaluation which was done on 9/4 with concern of significant dementia.  Lives alone and unable to take care of herself. -PT/OT are recommending SNF  Rhabdomyolysis CK of 3362, likely secondary to frequent falls and staying on floor for a long time. - IV fluid -Monitor CK  Diabetes mellitus without complication (HCC) Uncontrolled with hyperglycemia and A1c of 11.1 Patient was not taking her medications and insulin  regularly. - Continue with Lantus  and SSI -Adding 5 units with meal  Hypertension Blood pressure currently soft. Patient was on felodipine  at home which is currently being held. - Continue to monitor  Dyslipidemia - Continue with Lipitor  Depression - Continue Wellbutrin   Chronic renal disease, stage III (HCC) Baseline creatinine seems to be around 1.1-1.2 Slight increase in creatinine to 1.34 but not meeting the criteria for AKI. - Giving some IV fluid -Monitor renal function - Avoid nephrotoxins  Hypothyroidism TSH level normal at 3.436 -Continue with Synthroid    Subjective: Patient was seen and examined today.  She was having worsening generalized weakness, no focal deficit.  Unable to take care of herself and having frequent falls.  She was not taking her medications regularly  stating that she used to take them very regularly before but not sure why she cannot take them now anymore.  Lives alone and no immediate family, couple of sisters who checks on her.  Physical Exam: Vitals:   06/20/24 2354 06/21/24 0402 06/21/24  0737 06/21/24 1223  BP: 103/61 (!) 100/57 122/72 (!) 83/44  Pulse: 90 83 (!) 101 99  Resp: 18 18 16 16   Temp: 98.5 F (36.9 C) 98.3 F (36.8 C) 98.6 F (37 C) 97.6 F (36.4 C)  TempSrc:  Oral Oral   SpO2: 97% 97% 97% 98%  Weight:      Height:       General.  Frail elderly lady, in no acute distress. Pulmonary.  Lungs clear bilaterally, normal respiratory effort. CV.  Regular rate and rhythm, no JVD, rub or murmur. Abdomen.  Soft, nontender, nondistended, BS positive. CNS.  Alert and oriented .  No focal neurologic deficit. Extremities.  No edema, no cyanosis, pulses intact and symmetrical. Psychiatry.  Judgment and insight appears normal.   Data Reviewed: Prior data reviewed  Family Communication: Talked with sister on phone.  Disposition: Status is: Inpatient Remains inpatient appropriate because: Severity of illness  Planned Discharge Destination: Skilled nursing facility  DVT prophylaxis.  Subcu heparin  Time spent: 50 minutes  This record has been created using Conservation officer, historic buildings. Errors have been sought and corrected,but may not always be located. Such creation errors do not reflect on the standard of care.   Author: Amaryllis Dare, MD 06/21/2024 1:42 PM  For on call review [email or web address].

## 2024-06-21 NOTE — Assessment & Plan Note (Signed)
 Continue Wellbutrin .

## 2024-06-21 NOTE — Assessment & Plan Note (Signed)
 Seems like progressive decline, cognition evaluation which was done on 9/4 with concern of significant dementia.  Lives alone and unable to take care of herself. -PT/OT are recommending SNF

## 2024-06-21 NOTE — Assessment & Plan Note (Signed)
 Uncontrolled with hyperglycemia and A1c of 11.1 Patient was not taking her medications and insulin  regularly. - Continue with Lantus  and SSI -Adding 5 units with meal

## 2024-06-21 NOTE — NC FL2 (Signed)
 [place]  MEDICAID FL2 LEVEL OF CARE FORM     IDENTIFICATION  Patient Name: Pamela Smith Birthdate: 05-Feb-1946 Sex: female Admission Date (Current Location): 06/20/2024  [facility] and [place] Number:  Chiropodist and Address:  [facility], [address], [place], [place] [address]      Provider Number: [id number]  Attending Physician Name and Address:  Caleen Qualia, MD  Relative Name and Phone Number:  Slater  (Sister) [phone] & (Sister) Devere [phone]    Current Level of Care: Hospital Recommended Level of Care: Skilled Nursing Facility Prior Approval Number:    Date Approved/Denied:   PASRR Number: [id number]  Discharge Plan: SNF    Current Diagnoses: Patient Active Problem List   Diagnosis Date Noted   Frequent falls 06/21/2024   Chronic renal disease, stage III (HCC) 06/21/2024   Diabetes mellitus without complication (HCC)    Dyslipidemia    Depression    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   Stroke (HCC) 06/20/2024   Alcohol use 09/24/2023   Numbness of fingers of both hands 09/24/2023   Periorbital ecchymosis, left, initial encounter 09/24/2023   Rhabdomyolysis 09/23/2023   Pyuria 09/23/2023   Type 2 diabetes mellitus with hyperglycemia, with long-term current use of insulin  (HCC) 08/07/2019   Osteopenia of multiple sites 07/12/2018   Iron  deficiency anemia due to chronic blood loss 04/04/2018   Transaminitis 10/04/2017   Chronic anemia 09/01/2015   Mixed hyperlipidemia 03/01/2015   Vasovagal reaction 03/01/2015   Hyponatremia 11/13/2013   Hypo-osmolality and hyponatremia 11/13/2013   Obesity 06/25/2013   Major depressive disorder, single episode, unspecified 08/20/2006   Hypothyroidism 08/14/2004   Hypertension 08/14/2004    Orientation RESPIRATION BLADDER Height & Weight     Self, Situation, Time, Place (with intermitnent confuin per bedside RN)  Normal Incontinent Weight: 72 kg Height:  5' 3 (160 cm)   BEHAVIORAL SYMPTOMS/MOOD NEUROLOGICAL BOWEL NUTRITION STATUS      Incontinent Diet (Heart Healthy Carb Modified)  AMBULATORY STATUS COMMUNICATION OF NEEDS Skin   Limited Assist Verbally Normal                       Personal Care Assistance Level of Assistance  Bathing Bathing Assistance: Limited assistance (needs queing)         Functional Limitations Info             SPECIAL CARE FACTORS FREQUENCY  PT (By licensed PT), OT (By licensed OT)     PT Frequency: 5x a wk OT Frequency: 5 x a wk            Contractures Contractures Info: Not present    Additional Factors Info  Code Status Code Status Info: Full Code             Current Medications (06/21/2024):  This is the current hospital active medication list Current Facility-Administered Medications  Medication Dose Route Frequency Provider Last Rate Last Admin   acetaminophen  (TYLENOL ) tablet 650 mg  650 mg Oral Q4H PRN Laurita Manor T, MD       Or   acetaminophen  (TYLENOL ) 160 MG/5ML solution 650 mg  650 mg Per Tube Q4H PRN Laurita Manor T, MD       Or   acetaminophen  (TYLENOL ) suppository 650 mg  650 mg Rectal Q4H PRN Laurita Manor T, MD       albuterol  (PROVENTIL ) (2.5 MG/3ML) 0.083% nebulizer solution 3 mL  3 mL Inhalation Q6H PRN Laurita Manor DASEN, MD  aspirin  chewable tablet 81 mg  81 mg Oral Daily Laurita Manor T, MD   81 mg at 06/21/24 0900   atorvastatin  (LIPITOR) tablet 40 mg  40 mg Oral Daily Zhang, Ping T, MD   40 mg at 06/21/24 0900   buPROPion  (WELLBUTRIN  SR) 12 hr tablet 150 mg  150 mg Oral BID Laurita Manor T, MD   150 mg at 06/21/24 0900   felodipine  (PLENDIL ) 24 hr tablet 10 mg  10 mg Oral Daily Laurita Manor T, MD   10 mg at 06/21/24 0900   heparin  injection 5,000 Units  5,000 Units Subcutaneous Q12H Laurita Manor T, MD   5,000 Units at 06/21/24 0901   hydrALAZINE  (APRESOLINE ) injection 5 mg  5 mg Intravenous Q6H PRN Laurita Manor T, MD       insulin  aspart (novoLOG ) injection 0-15 Units  0-15 Units  Subcutaneous TID WC Zhang, Ping T, MD   5 Units at 06/21/24 1227   insulin  aspart (novoLOG ) injection 0-5 Units  0-5 Units Subcutaneous QHS Laurita Manor T, MD   5 Units at 06/20/24 2136   insulin  aspart (novoLOG ) injection 5 Units  5 Units Subcutaneous TID WC Amin, Sumayya, MD       insulin  glargine (LANTUS ) injection 30 Units  30 Units Subcutaneous QHS Laurita Manor DASEN, MD   30 Units at 06/20/24 2048   lactated ringers  infusion   Intravenous Continuous Amin, Sumayya, MD       levothyroxine  (SYNTHROID ) tablet 75 mcg  75 mcg Oral QAC breakfast Laurita Manor T, MD   75 mcg at 06/21/24 0507   LORazepam  (ATIVAN ) tablet 0.5 mg  0.5 mg Oral Q6H PRN Laurita Manor DASEN, MD       pantoprazole  (PROTONIX ) EC tablet 40 mg  40 mg Oral Daily Zhang, Ping T, MD   40 mg at 06/21/24 0900   senna-docusate (Senokot-S) tablet 1 tablet  1 tablet Oral QHS PRN Laurita Manor DASEN, MD         Discharge Medications: Please see discharge summary for a list of discharge medications.  Relevant Imaging Results:  Relevant Lab Results:   Additional Information ss [id number]  Lizzeth Meder, RN

## 2024-06-21 NOTE — Plan of Care (Signed)
 Problem: Education: Goal: Ability to describe self-care measures that may prevent or decrease complications (Diabetes Survival Skills Education) will improve 06/21/2024 0547 by Pola Bridegroom, RN Outcome: Progressing 06/21/2024 0306 by Pola Bridegroom, RN Outcome: Progressing Goal: Individualized Educational Video(s) 06/21/2024 0547 by Pola Bridegroom, RN Outcome: Progressing 06/21/2024 0306 by Pola Bridegroom, RN Outcome: Progressing   Problem: Coping: Goal: Ability to adjust to condition or change in health will improve 06/21/2024 0547 by Pola Bridegroom, RN Outcome: Progressing 06/21/2024 0306 by Pola Bridegroom, RN Outcome: Progressing   Problem: Fluid Volume: Goal: Ability to maintain a balanced intake and output will improve 06/21/2024 0547 by Pola Bridegroom, RN Outcome: Progressing 06/21/2024 0306 by Pola Bridegroom, RN Outcome: Progressing   Problem: Health Behavior/Discharge Planning: Goal: Ability to identify and utilize available resources and services will improve 06/21/2024 0547 by Pola Bridegroom, RN Outcome: Progressing 06/21/2024 0306 by Pola Bridegroom, RN Outcome: Progressing Goal: Ability to manage health-related needs will improve 06/21/2024 0547 by Pola Bridegroom, RN Outcome: Progressing 06/21/2024 0306 by Pola Bridegroom, RN Outcome: Progressing   Problem: Metabolic: Goal: Ability to maintain appropriate glucose levels will improve 06/21/2024 0547 by Pola Bridegroom, RN Outcome: Progressing 06/21/2024 0306 by Pola Bridegroom, RN Outcome: Progressing   Problem: Nutritional: Goal: Maintenance of adequate nutrition will improve 06/21/2024 0547 by Pola Bridegroom, RN Outcome: Progressing 06/21/2024 0306 by Pola Bridegroom, RN Outcome: Progressing Goal: Progress toward achieving an optimal weight will improve 06/21/2024 0547 by Pola Bridegroom, RN Outcome: Progressing 06/21/2024 0306 by Pola Bridegroom, RN Outcome: Progressing   Problem: Skin Integrity: Goal: Risk for impaired skin integrity will decrease 06/21/2024 0547 by Pola Bridegroom, RN Outcome:  Progressing 06/21/2024 0306 by Pola Bridegroom, RN Outcome: Progressing   Problem: Tissue Perfusion: Goal: Adequacy of tissue perfusion will improve 06/21/2024 0547 by Pola Bridegroom, RN Outcome: Progressing 06/21/2024 0306 by Pola Bridegroom, RN Outcome: Progressing   Problem: Education: Goal: Knowledge of disease or condition will improve 06/21/2024 0547 by Pola Bridegroom, RN Outcome: Progressing 06/21/2024 0306 by Pola Bridegroom, RN Outcome: Progressing Goal: Knowledge of secondary prevention will improve (MUST DOCUMENT ALL) 06/21/2024 0547 by Pola Bridegroom, RN Outcome: Progressing 06/21/2024 0306 by Pola Bridegroom, RN Outcome: Progressing Goal: Knowledge of patient specific risk factors will improve (DELETE if not current risk factor) 06/21/2024 0547 by Pola Bridegroom, RN Outcome: Progressing 06/21/2024 0306 by Pola Bridegroom, RN Outcome: Progressing   Problem: Ischemic Stroke/TIA Tissue Perfusion: Goal: Complications of ischemic stroke/TIA will be minimized 06/21/2024 0547 by Pola Bridegroom, RN Outcome: Progressing 06/21/2024 0306 by Pola Bridegroom, RN Outcome: Progressing   Problem: Coping: Goal: Will verbalize positive feelings about self 06/21/2024 0547 by Pola Bridegroom, RN Outcome: Progressing 06/21/2024 0306 by Pola Bridegroom, RN Outcome: Progressing Goal: Will identify appropriate support needs 06/21/2024 0547 by Pola Bridegroom, RN Outcome: Progressing 06/21/2024 0306 by Pola Bridegroom, RN Outcome: Progressing   Problem: Health Behavior/Discharge Planning: Goal: Ability to manage health-related needs will improve 06/21/2024 0547 by Pola Bridegroom, RN Outcome: Progressing 06/21/2024 0306 by Pola Bridegroom, RN Outcome: Progressing Goal: Goals will be collaboratively established with patient/family 06/21/2024 0547 by Pola Bridegroom, RN Outcome: Progressing 06/21/2024 0306 by Pola Bridegroom, RN Outcome: Progressing   Problem: Self-Care: Goal: Ability to participate in self-care as condition permits will improve 06/21/2024 0547 by Pola Bridegroom, RN Outcome:  Progressing 06/21/2024 0306 by Pola Bridegroom, RN Outcome: Progressing Goal: Verbalization of feelings and concerns over difficulty with self-care will improve 06/21/2024 0547 by Pola Bridegroom, RN Outcome: Progressing 06/21/2024 0306 by Pola Bridegroom, RN Outcome: Progressing Goal: Ability to communicate  needs accurately will improve 06/21/2024 0547 by Pola Bridegroom, RN Outcome: Progressing 06/21/2024 0306 by Pola Bridegroom, RN Outcome: Progressing   Problem: Nutrition: Goal: Risk of aspiration will decrease 06/21/2024 0547 by Pola Bridegroom, RN Outcome: Progressing 06/21/2024 0306 by Pola Bridegroom, RN Outcome: Progressing Goal: Dietary intake will improve 06/21/2024 0547 by Pola Bridegroom, RN Outcome: Progressing 06/21/2024 0306 by Pola Bridegroom, RN Outcome: Progressing   Problem: Education: Goal: Knowledge of General Education information will improve Description: Including pain rating scale, medication(s)/side effects and non-pharmacologic comfort measures 06/21/2024 0547 by Pola Bridegroom, RN Outcome: Progressing 06/21/2024 0306 by Pola Bridegroom, RN Outcome: Progressing   Problem: Health Behavior/Discharge Planning: Goal: Ability to manage health-related needs will improve 06/21/2024 0547 by Pola Bridegroom, RN Outcome: Progressing 06/21/2024 0306 by Pola Bridegroom, RN Outcome: Progressing   Problem: Clinical Measurements: Goal: Ability to maintain clinical measurements within normal limits will improve 06/21/2024 0547 by Pola Bridegroom, RN Outcome: Progressing 06/21/2024 0306 by Pola Bridegroom, RN Outcome: Progressing Goal: Will remain free from infection 06/21/2024 0547 by Pola Bridegroom, RN Outcome: Progressing 06/21/2024 0306 by Pola Bridegroom, RN Outcome: Progressing Goal: Diagnostic test results will improve 06/21/2024 0547 by Pola Bridegroom, RN Outcome: Progressing 06/21/2024 0306 by Pola Bridegroom, RN Outcome: Progressing Goal: Respiratory complications will improve 06/21/2024 0547 by Pola Bridegroom, RN Outcome: Progressing 06/21/2024 0306 by Pola Bridegroom,  RN Outcome: Progressing Goal: Cardiovascular complication will be avoided 06/21/2024 0547 by Pola Bridegroom, RN Outcome: Progressing 06/21/2024 0306 by Pola Bridegroom, RN Outcome: Progressing   Problem: Activity: Goal: Risk for activity intolerance will decrease 06/21/2024 0547 by Pola Bridegroom, RN Outcome: Progressing 06/21/2024 0306 by Pola Bridegroom, RN Outcome: Progressing   Problem: Nutrition: Goal: Adequate nutrition will be maintained 06/21/2024 0547 by Pola Bridegroom, RN Outcome: Progressing 06/21/2024 0306 by Pola Bridegroom, RN Outcome: Progressing   Problem: Coping: Goal: Level of anxiety will decrease 06/21/2024 0547 by Pola Bridegroom, RN Outcome: Progressing 06/21/2024 0306 by Pola Bridegroom, RN Outcome: Progressing   Problem: Elimination: Goal: Will not experience complications related to bowel motility 06/21/2024 0547 by Pola Bridegroom, RN Outcome: Progressing 06/21/2024 0306 by Pola Bridegroom, RN Outcome: Progressing Goal: Will not experience complications related to urinary retention 06/21/2024 0547 by Pola Bridegroom, RN Outcome: Progressing 06/21/2024 0306 by Pola Bridegroom, RN Outcome: Progressing   Problem: Pain Managment: Goal: General experience of comfort will improve and/or be controlled 06/21/2024 0547 by Pola Bridegroom, RN Outcome: Progressing 06/21/2024 0306 by Pola Bridegroom, RN Outcome: Progressing   Problem: Safety: Goal: Ability to remain free from injury will improve 06/21/2024 0547 by Pola Bridegroom, RN Outcome: Progressing 06/21/2024 0306 by Pola Bridegroom, RN Outcome: Progressing   Problem: Skin Integrity: Goal: Risk for impaired skin integrity will decrease 06/21/2024 0547 by Pola Bridegroom, RN Outcome: Progressing 06/21/2024 0306 by Pola Bridegroom, RN Outcome: Progressing   Problem: Ischemic Stroke/TIA Tissue Perfusion: Goal: Complications of ischemic stroke/TIA will be minimized 06/21/2024 0547 by Pola Bridegroom, RN Outcome: Progressing 06/21/2024 0306 by Pola Bridegroom, RN Outcome: Progressing   Problem: Skin Integrity: Goal:  Risk for impaired skin integrity will decrease 06/21/2024 0547 by Pola Bridegroom, RN Outcome: Progressing 06/21/2024 0306 by Pola Bridegroom, RN Outcome: Progressing

## 2024-06-21 NOTE — Progress Notes (Signed)
 Mobility Specialist - Progress Note     06/21/24 1600  Mobility  Activity Stood at bedside;Ambulated with assistance  Level of Assistance Minimal assist, patient does 75% or more  Assistive Device Front wheel walker  Distance Ambulated (ft) 200 ft  Range of Motion/Exercises Active  Activity Response Tolerated well  Mobility Referral Yes  Mobility visit 1 Mobility  Mobility Specialist Start Time (ACUTE ONLY) 1512  Mobility Specialist Stop Time (ACUTE ONLY) 1545  Mobility Specialist Time Calculation (min) (ACUTE ONLY) 33 min   Pt resting in bed on RA upon entry. Pt STS and ambulates to hallway around NS MinA with RW. Pt given verbal cuing to slow gait and recenter herself in the walker to avoid falling. Pt endorses no pain only slightly diminished grip strength on the right side. Pt returned to bed and left with needs in reach and bed alarm activated.   Guido Rumble Mobility Specialist 06/21/24, 4:23 PM

## 2024-06-21 NOTE — Assessment & Plan Note (Signed)
 MRI brain with concern of acute/subacute basal ganglia infarct but her symptoms are not consistent with any acute stroke.  Neurology ordered MRI brain with contrast.  MRA head and neck was negative for any LVO.  Completed the stroke workup Started on baby aspirin - -continue with Lipitor -Follow-up on MRI with contrast

## 2024-06-21 NOTE — Assessment & Plan Note (Signed)
 Continue with core

## 2024-06-22 DIAGNOSIS — T796XXA Traumatic ischemia of muscle, initial encounter: Secondary | ICD-10-CM | POA: Diagnosis not present

## 2024-06-22 DIAGNOSIS — E785 Hyperlipidemia, unspecified: Secondary | ICD-10-CM | POA: Diagnosis not present

## 2024-06-22 DIAGNOSIS — I639 Cerebral infarction, unspecified: Secondary | ICD-10-CM | POA: Diagnosis not present

## 2024-06-22 DIAGNOSIS — R296 Repeated falls: Secondary | ICD-10-CM | POA: Diagnosis not present

## 2024-06-22 LAB — GLUCOSE, CAPILLARY
Glucose-Capillary: 312 mg/dL — ABNORMAL HIGH (ref 70–99)
Glucose-Capillary: 177 mg/dL — ABNORMAL HIGH (ref 70–99)
Glucose-Capillary: 317 mg/dL — ABNORMAL HIGH (ref 70–99)
Glucose-Capillary: 209 mg/dL — ABNORMAL HIGH (ref 70–99)
Glucose-Capillary: 214 mg/dL — ABNORMAL HIGH (ref 70–99)

## 2024-06-22 LAB — CBC
HCT: 34.6 % — ABNORMAL LOW (ref 36.0–46.0)
Hemoglobin: 11.2 g/dL — ABNORMAL LOW (ref 12.0–15.0)
MCH: 27.7 pg (ref 26.0–34.0)
MCHC: 32.4 g/dL (ref 30.0–36.0)
MCV: 85.4 fL (ref 80.0–100.0)
Platelets: 229 K/uL (ref 150–400)
RBC: 4.05 MIL/uL (ref 3.87–5.11)
RDW: 16.2 % — ABNORMAL HIGH (ref 11.5–15.5)
WBC: 9.6 K/uL (ref 4.0–10.5)
nRBC: 0 % (ref 0.0–0.2)

## 2024-06-22 LAB — URINALYSIS, COMPLETE (UACMP) WITH MICROSCOPIC
Bilirubin Urine: NEGATIVE
Glucose, UA: 50 mg/dL — AB
Hgb urine dipstick: NEGATIVE
Ketones, ur: NEGATIVE mg/dL
Leukocytes,Ua: NEGATIVE
Nitrite: NEGATIVE
Protein, ur: NEGATIVE mg/dL
Specific Gravity, Urine: 1.005 (ref 1.005–1.030)
pH: 6 (ref 5.0–8.0)

## 2024-06-22 LAB — RENAL FUNCTION PANEL
Albumin: 2.8 g/dL — ABNORMAL LOW (ref 3.5–5.0)
Anion gap: 10 (ref 5–15)
BUN: 21 mg/dL (ref 8–23)
CO2: 24 mmol/L (ref 22–32)
Calcium: 8.8 mg/dL — ABNORMAL LOW (ref 8.9–10.3)
Chloride: 104 mmol/L (ref 98–111)
Creatinine, Ser: 1.21 mg/dL — ABNORMAL HIGH (ref 0.44–1.00)
GFR, Estimated: 46 mL/min — ABNORMAL LOW (ref >60)
Glucose, Bld: 168 mg/dL — ABNORMAL HIGH (ref 70–99)
Phosphorus: 3.6 mg/dL (ref 2.5–4.6)
Potassium: 3.6 mmol/L (ref 3.5–5.1)
Sodium: 138 mmol/L (ref 135–145)

## 2024-06-22 LAB — CK: Total CK: 1504 U/L — ABNORMAL HIGH (ref 38–234)

## 2024-06-22 LAB — MAGNESIUM: Magnesium: 2 mg/dL (ref 1.7–2.4)

## 2024-06-22 MED ORDER — INSULIN ASPART 100 UNIT/ML IJ SOLN
0.0000 [IU] | Freq: Three times a day (TID) | INTRAMUSCULAR | Status: DC
  Administered 2024-06-23: 3 [IU] via SUBCUTANEOUS
  Filled 2024-06-22: qty 1

## 2024-06-22 MED ORDER — INSULIN GLARGINE 100 UNIT/ML ~~LOC~~ SOLN
40.0000 [IU] | Freq: Every day | SUBCUTANEOUS | Status: DC
  Administered 2024-06-22: 40 [IU] via SUBCUTANEOUS
  Filled 2024-06-22: qty 0.4

## 2024-06-22 MED ORDER — INSULIN ASPART 100 UNIT/ML IJ SOLN
0.0000 [IU] | Freq: Every day | INTRAMUSCULAR | Status: DC
  Administered 2024-06-22: 2 [IU] via SUBCUTANEOUS
  Filled 2024-06-22: qty 1

## 2024-06-22 MED ORDER — CYANOCOBALAMIN 1000 MCG/ML IJ SOLN
1000.0000 ug | INTRAMUSCULAR | Status: DC
  Administered 2024-06-22: 1000 ug via INTRAMUSCULAR
  Filled 2024-06-22: qty 1

## 2024-06-22 MED ORDER — INSULIN ASPART 100 UNIT/ML IJ SOLN
5.0000 [IU] | Freq: Three times a day (TID) | INTRAMUSCULAR | Status: DC
  Administered 2024-06-23: 5 [IU] via SUBCUTANEOUS
  Filled 2024-06-22: qty 1

## 2024-06-22 NOTE — Assessment & Plan Note (Signed)
 Uncontrolled with hyperglycemia and A1c of 11.1 Patient was not taking her medications and insulin  regularly.  CBG elevated - Continue with Lantus  and SSI -Lantus  dose was increased to 40 units at night-patient was on 50 units at home - Continue 5 units with meal

## 2024-06-22 NOTE — Progress Notes (Signed)
 Progress Note   Patient: Pamela Smith FMW:[id number] DOB: [birth year] DOA: 06/20/2024     2 DOS: the patient was seen and examined on 06/22/2024   Brief hospital course: Partly taken from H&P.  BETH GOODLIN is a 78 y.o. female with medical history significant of IDDM, HTN, hypothyroidism, HLD, GERD, chronic ambulation impairment, presented with frequent falls and generalized weakness.   Patient was seen in ED on 9/4 with significant hyperglycemia, apparently she was not taking any medications.  PT and OT did not recommend SNF at that time and patient also does not want to go to any facility so she was discharged back home.  There is also a concern of underlying dementia.  On presentation hemodynamically stable, labs with corrected sodium of 133, blood glucose of 357, T. bili of 1.4, creatinine at 1.22 which is around her baseline, leukocytosis at 15.6. CT head with a concern of acute to subacute infarct involving the right caudate head and adjacent portion of the anterior aspect of basal nuclei on the right.  Generalized cerebral atrophy with widening of extra-axial spaces and ventricular dilatation.  Patient was admitted for stroke workup.  9/7: Vital stable, MRI with concern of a subacute anterior right basal ganglia infarct, moderate chronic small vessel disease. MRA head and neck was motion degraded but negative for any LVO, moderate to severe stenosis in the right P2 segment.  A1c checked on 9/4 was 11.1, lipid panel with elevated total cholesterol at 210, HDL 36, triglyceride 225 and LDL of 129.  CBG started improving and now at 173.  Patient was started on aspirin  and Lipitor.  Passed swallow evaluation.  Recent cognition evaluation is consistent with dementia. CK elevated at 3362, echocardiogram with normal EF and grade 1 diastolic dysfunction.  PT is recommending SNF MRI brain with contrast was ordered by neurology as her symptoms does not match with any acute stroke. Ordered some IV  fluid for rhabdomyolysis.  9/8: Remained hemodynamically stable, MRI with contrast with a concern of subacute right basal ganglia lesion, neurology is recommending repeat imaging in 36-month to ensure expected evolution.  Carotid ultrasound with less than 50% stenosis and mild atherosclerotic plaque formation bilaterally.  B12 low at 238. Neurology is recommending aspirin  and Lipitor along with B12 supplement.  She need to follow-up with outpatient neurology for further assistance. CK improving now at 1504. Obtained insurance authorization to go to rehab tomorrow.  Assessment and Plan: * Stroke [facility]) MRI brain with concern of acute/subacute basal ganglia infarct but her symptoms are not consistent with any acute stroke.  Repeat MRI with contrast consistent with subacute right basal ganglia lesion with recommendation to repeat imaging in 2 months.  MRA head and neck was negative for any LVO.  Carotid ultrasound with less than 50% stenosis.  Completed the stroke workup Started on baby aspirin -will continue -continue with Lipitor - PT is recommending SNF  Frequent falls Seems like progressive decline, cognition evaluation which was done on 9/4 with concern of significant dementia.  Lives alone and unable to take care of herself. -PT/OT are recommending SNF  Rhabdomyolysis CK of 3362>>1504, likely secondary to frequent falls and staying on floor for a long time. - IV fluid for another day -Monitor CK  Diabetes mellitus without complication (HCC) Uncontrolled with hyperglycemia and A1c of 11.1 Patient was not taking her medications and insulin  regularly.  CBG elevated - Continue with Lantus  and SSI -Lantus  dose was increased to 40 units at night-patient was on 50 units at  home - Continue 5 units with meal  Hypertension Blood pressure within goal. Patient was on felodipine  at home which is currently being held. - Continue to monitor  Dyslipidemia - Continue with Lipitor  Depression -  Continue Wellbutrin   Chronic renal disease, stage III (HCC) Baseline creatinine seems to be around 1.1-1.2 Slight increase in creatinine to 1.34 but not meeting the criteria for AKI. - Giving some IV fluid -Monitor renal function - Avoid nephrotoxins  Hypothyroidism TSH level normal at 3.436 -Continue with Synthroid    Subjective: Patient was seen and examined today.  No new concern or deficit.  2 sisters at bed side.  Physical Exam: Vitals:   06/22/24 0731 06/22/24 0802 06/22/24 1158 06/22/24 1507  BP: 126/65 (!) 143/62 116/61 (!) 123/58  Pulse: 92 92 96 97  Resp: 18 16 20 19   Temp: 98 F (36.7 C) 97.9 F (36.6 C) 98.3 F (36.8 C) 98.3 F (36.8 C)  TempSrc:  Oral Oral Oral  SpO2: 100% 98% 95% 97%  Weight:      Height:       General.  Frail elderly lady, in no acute distress. Pulmonary.  Lungs clear bilaterally, normal respiratory effort. CV.  Regular rate and rhythm, no JVD, rub or murmur. Abdomen.  Soft, nontender, nondistended, BS positive. CNS.  Alert and oriented .  No focal neurologic deficit. Extremities.  No edema, no cyanosis, pulses intact and symmetrical. Psychiatry.  Judgment and insight appears normal.   Data Reviewed: Prior data reviewed  Family Communication: Discussed with 2 sisters at bedside  Disposition: Status is: Inpatient Remains inpatient appropriate because: Severity of illness  Planned Discharge Destination: Skilled nursing facility  DVT prophylaxis.  Subcu heparin  Time spent: 50 minutes  This record has been created using Conservation officer, historic buildings. Errors have been sought and corrected,but may not always be located. Such creation errors do not reflect on the standard of care.   Author: Amaryllis Dare, MD 06/22/2024 4:11 PM  For on call review [email or web address].

## 2024-06-22 NOTE — Inpatient Diabetes Management (Signed)
 Inpatient Diabetes Program Recommendations  AACE/ADA: New Consensus Statement on Inpatient Glycemic Control (2015)  Target Ranges:  Prepandial:   less than 140 mg/dL      Peak postprandial:   less than 180 mg/dL (1-2 hours)      Critically ill patients:  140 - 180 mg/dL   Lab Results  Component Value Date   GLUCAP 209 (H) 06/22/2024   HGBA1C 11.1 (H) 06/18/2024    Review of Glycemic Control  Diabetes history: DM2 Outpatient Diabetes medications: Lantus  50 daily, metformin  1000 mg BID, Januvia 100 daily Current orders for Inpatient glycemic control: Lantus  40 at bedtime, Novolog  0-15 TID with meals and 0-5 HS + 5 units TID  HgbA1C 11.1% 177, 317, 209 - received 34 units of Novolog  thus far today  Inpatient Diabetes Program Recommendations:    Doubtful pt was taking full Lantus  dose (50 units daily) with HgbA1C of 11.1%  Asked about calling pt regarding her HgbA1C this afternoon and RN states it would be better to speak with her in am in person. Diabetes Coordinator to see pt regarding HgbA1C on 9/9.  Avoid hypoglycemia, may need less basal insulin  and more meal coverage insulin  since pt eating 80%.   Follow closely.   Thank you. Shona Brandy, RD, LDN, CDCES Inpatient Diabetes Coordinator [phone]

## 2024-06-22 NOTE — Assessment & Plan Note (Signed)
 MRI brain with concern of acute/subacute basal ganglia infarct but her symptoms are not consistent with any acute stroke.  Repeat MRI with contrast consistent with subacute right basal ganglia lesion with recommendation to repeat imaging in 2 months.  MRA head and neck was negative for any LVO.  Carotid ultrasound with less than 50% stenosis.  Completed the stroke workup Started on baby aspirin -will continue -continue with Lipitor - PT is recommending SNF

## 2024-06-22 NOTE — Plan of Care (Signed)
  Problem: Skin Integrity: Goal: Risk for impaired skin integrity will decrease Outcome: Progressing   Problem: Pain Managment: Goal: General experience of comfort will improve and/or be controlled Outcome: Progressing   Problem: Safety: Goal: Ability to remain free from injury will improve Outcome: Progressing

## 2024-06-22 NOTE — Assessment & Plan Note (Signed)
 Blood pressure within goal. Patient was on felodipine  at home which is currently being held. - Continue to monitor

## 2024-06-22 NOTE — Plan of Care (Signed)
  Problem: Education: Goal: Ability to describe self-care measures that may prevent or decrease complications (Diabetes Survival Skills Education) will improve Outcome: Not Progressing Goal: Individualized Educational Video(s) Outcome: Not Progressing   Problem: Coping: Goal: Ability to adjust to condition or change in health will improve Outcome: Not Progressing   Problem: Fluid Volume: Goal: Ability to maintain a balanced intake and output will improve Outcome: Not Progressing   Problem: Health Behavior/Discharge Planning: Goal: Ability to identify and utilize available resources and services will improve Outcome: Not Progressing Goal: Ability to manage health-related needs will improve Outcome: Not Progressing   Problem: Metabolic: Goal: Ability to maintain appropriate glucose levels will improve Outcome: Not Progressing   Problem: Nutritional: Goal: Maintenance of adequate nutrition will improve Outcome: Not Progressing Goal: Progress toward achieving an optimal weight will improve Outcome: Not Progressing   Problem: Skin Integrity: Goal: Risk for impaired skin integrity will decrease Outcome: Not Progressing   Problem: Tissue Perfusion: Goal: Adequacy of tissue perfusion will improve Outcome: Not Progressing   Problem: Education: Goal: Knowledge of disease or condition will improve Outcome: Not Progressing Goal: Knowledge of secondary prevention will improve (MUST DOCUMENT ALL) Outcome: Not Progressing Goal: Knowledge of patient specific risk factors will improve (DELETE if not current risk factor) Outcome: Not Progressing   Problem: Ischemic Stroke/TIA Tissue Perfusion: Goal: Complications of ischemic stroke/TIA will be minimized Outcome: Not Progressing   Problem: Coping: Goal: Will verbalize positive feelings about self Outcome: Not Progressing Goal: Will identify appropriate support needs Outcome: Not Progressing   Problem: Health Behavior/Discharge  Planning: Goal: Ability to manage health-related needs will improve Outcome: Not Progressing Goal: Goals will be collaboratively established with patient/family Outcome: Not Progressing   Problem: Self-Care: Goal: Ability to participate in self-care as condition permits will improve Outcome: Not Progressing Goal: Verbalization of feelings and concerns over difficulty with self-care will improve Outcome: Not Progressing Goal: Ability to communicate needs accurately will improve Outcome: Not Progressing   Problem: Nutrition: Goal: Risk of aspiration will decrease Outcome: Not Progressing Goal: Dietary intake will improve Outcome: Not Progressing   Problem: Education: Goal: Knowledge of General Education information will improve Description: Including pain rating scale, medication(s)/side effects and non-pharmacologic comfort measures Outcome: Not Progressing   Problem: Health Behavior/Discharge Planning: Goal: Ability to manage health-related needs will improve Outcome: Not Progressing   Problem: Clinical Measurements: Goal: Ability to maintain clinical measurements within normal limits will improve Outcome: Not Progressing Goal: Will remain free from infection Outcome: Not Progressing Goal: Diagnostic test results will improve Outcome: Not Progressing Goal: Respiratory complications will improve Outcome: Not Progressing Goal: Cardiovascular complication will be avoided Outcome: Not Progressing   Problem: Activity: Goal: Risk for activity intolerance will decrease Outcome: Not Progressing   Problem: Nutrition: Goal: Adequate nutrition will be maintained Outcome: Not Progressing   Problem: Coping: Goal: Level of anxiety will decrease Outcome: Not Progressing   Problem: Elimination: Goal: Will not experience complications related to bowel motility Outcome: Not Progressing Goal: Will not experience complications related to urinary retention Outcome: Not  Progressing   Problem: Pain Managment: Goal: General experience of comfort will improve and/or be controlled Outcome: Not Progressing   Problem: Safety: Goal: Ability to remain free from injury will improve Outcome: Not Progressing   Problem: Skin Integrity: Goal: Risk for impaired skin integrity will decrease Outcome: Not Progressing

## 2024-06-22 NOTE — Plan of Care (Signed)
 Please see neurology consult from yesterday for full findings and recommendations.  Interim results:  MRI brain w contrast 1. Heterogeneous enhancement of the right basal ganglia lesion, greatest peripherally and again favored to reflect a subacute infarct. Consider follow-up imaging in 2 months to ensure expected evolution and exclude less likely alternative etiologies including neoplasm. 2. No other abnormal intracranial enhancement.  Carotid US   1. Right carotid artery system: Less than 50% stenosis secondary to mild multifocal atherosclerotic plaque formation.   2. Left carotid artery system: Less than 50% stenosis secondary to mild multifocal atherosclerotic plaque formation.   3.  Vertebral artery system: Patent with antegrade flow bilaterally.  TTE  1. Left ventricular ejection fraction, by estimation, is 60 to 65% . The left ventricle has normal function. The left ventricle has no regional wall motion abnormalities. There is moderate concentric left ventricular hypertrophy. Left ventricular diastolic parameters are consistent with Grade I diastolic dysfunction ( impaired relaxation) . 2. Right ventricular systolic function is normal. The right ventricular size is normal. 3. The mitral valve is normal in structure. Trivial mitral valve regurgitation. No evidence of mitral stenosis. 4. The aortic valve is normal in structure. Aortic valve regurgitation is not visualized. Aortic valve sclerosis/ calcification is present, without any evidence of aortic stenosis. 5. The inferior vena cava is normal in size with greater than 50% respiratory variability, suggesting right atrial pressure of 3 mmHg.  Stroke Labs     Component Value Date/Time   CHOL 210 (H) 06/21/2024 0301   TRIG 225 (H) 06/21/2024 0301   HDL 36 (L) 06/21/2024 0301   CHOLHDL 5.8 06/21/2024 0301   VLDL 45 (H) 06/21/2024 0301   LDLCALC 129 (H) 06/21/2024 0301    Lab Results  Component Value Date/Time   HGBA1C 11.1  (H) 06/18/2024 06:02 PM   Etiology of presentation favored to be 2/2 subacute stroke however as previously discussed CNS neoplasm cannot be ruled out.  B12 is low at 238, recommend aggressive supplementation  Final recommendations: - ASA 81mg  daily - Lipitor 40mg  daily - PT/OT/SLP - Vit B12 1000mcg IM injection once weekly x5 days f/b recheck B12 level in clinic  - No further inpatient neurology workup indicated at this time - Recommend f/u MRI brain with and without contrast to assess evolution of abnormality seen on brain MRI this admission - I will arrange outpatient neurology f/u  Neurology will sign off, but please reconsult if any new or additional questions arise.  Pamela Ross, MD [facility] [phone]  If 7pm- 7am, please page neurology on call as listed in AMION.

## 2024-06-22 NOTE — Progress Notes (Signed)
   06/22/24 1200  Spiritual Encounters  Type of Visit Initial  Care provided to: Pt and family  Conversation partners present during encounter Nurse  Referral source Patient request  Reason for visit Advance directives  OnCall Visit Yes   Chaplain visited with patient and family and explained the AD and the notary process per a [place] in the EPIC system.  Patient and family understood and will work on completing the documents.    Rev. Rana M. Nicholaus, M.Div. Chaplain Resident [facility]

## 2024-06-22 NOTE — Assessment & Plan Note (Signed)
 CK of 3362>>1504, likely secondary to frequent falls and staying on floor for a long time. - IV fluid for another day -Monitor CK

## 2024-06-22 NOTE — TOC Progression Note (Addendum)
 Transition of Care [facility]) - Progression Note    Patient Details  Name: Pamela Smith MRN: [id number] Date of Birth: January 07, 1946  Transition of Care [facility]) CM/SW Contact  [place] L Quinlee Sciarra, [place] Phone Number: 06/22/2024, 11:35 AM  Clinical Narrative:      Bed offers received. CSW sent an email to patients sisters for choice.   1:11pm: Email received from Devere, sister, advising that patient will accept bed at [facility]. Auth started.                    Expected Discharge Plan and Services                                               Social Drivers of Health (SDOH) Interventions SDOH Screenings   Food Insecurity: No Food Insecurity (06/20/2024)  Housing: Low Risk  (06/21/2024)  Transportation Needs: No Transportation Needs (06/20/2024)  Utilities: Not At Risk (06/20/2024)  Financial Resource Strain: Low Risk  (08/23/2023)   Received from [facility]  Social Connections: Socially Isolated (06/21/2024)  Tobacco Use: Medium Risk (06/20/2024)    Readmission Risk Interventions    09/24/2023   11:01 AM  Readmission Risk Prevention Plan  Post Dischage Appt Complete  Medication Screening Complete  Transportation Screening Complete

## 2024-06-22 NOTE — Progress Notes (Signed)
   06/22/24 1330  Spiritual Encounters  Type of Visit Follow up  Care provided to: Pt and family  Conversation partners present during encounter Nurse  Referral source Patient request  Reason for visit Advance directives  OnCall Visit Yes   Chaplain received a  Consult in the EPIC system that patient was ready for the notary and witness part of the AD process.  Chaplain reviewed document and confirmed it was complete.  Chaplain retained a notary and 2 witnesses.  Chaplain scanned document to applicable department, gave patient original and the family copies and added a copy to the patient's binder.  Patient and family had no other spiritual care needs at this time.    Rev. Rana M. Nicholaus, M.Div. Chaplain Resident [facility]

## 2024-06-23 DIAGNOSIS — F32A Depression, unspecified: Secondary | ICD-10-CM

## 2024-06-23 DIAGNOSIS — R296 Repeated falls: Secondary | ICD-10-CM | POA: Diagnosis not present

## 2024-06-23 DIAGNOSIS — E119 Type 2 diabetes mellitus without complications: Secondary | ICD-10-CM | POA: Diagnosis not present

## 2024-06-23 DIAGNOSIS — T796XXA Traumatic ischemia of muscle, initial encounter: Secondary | ICD-10-CM | POA: Diagnosis not present

## 2024-06-23 DIAGNOSIS — I639 Cerebral infarction, unspecified: Secondary | ICD-10-CM | POA: Diagnosis not present

## 2024-06-23 LAB — BASIC METABOLIC PANEL WITH GFR
Anion gap: 11 (ref 5–15)
BUN: 17 mg/dL (ref 8–23)
CO2: 25 mmol/L (ref 22–32)
Calcium: 9 mg/dL (ref 8.9–10.3)
Chloride: 103 mmol/L (ref 98–111)
Creatinine, Ser: 1.24 mg/dL — ABNORMAL HIGH (ref 0.44–1.00)
GFR, Estimated: 45 mL/min — ABNORMAL LOW (ref >60)
Glucose, Bld: 179 mg/dL — ABNORMAL HIGH (ref 70–99)
Potassium: 3.8 mmol/L (ref 3.5–5.1)
Sodium: 139 mmol/L (ref 135–145)

## 2024-06-23 LAB — GLUCOSE, CAPILLARY: Glucose-Capillary: 170 mg/dL — ABNORMAL HIGH (ref 70–99)

## 2024-06-23 LAB — CK: Total CK: 914 U/L — ABNORMAL HIGH (ref 38–234)

## 2024-06-23 MED ORDER — ACETAMINOPHEN 325 MG PO TABS: 650.0000 mg | ORAL_TABLET | ORAL | Status: AC | PRN

## 2024-06-23 MED ORDER — INSULIN ASPART 100 UNIT/ML IJ SOLN: 7.0000 [IU] | Freq: Three times a day (TID) | INTRAMUSCULAR | Status: DC

## 2024-06-23 MED ORDER — VITAMIN B-12 1000 MCG PO TABS: 1000.0000 ug | ORAL_TABLET | Freq: Every day | ORAL | Status: AC

## 2024-06-23 NOTE — TOC Progression Note (Addendum)
 Transition of Care [facility]) - Progression Note    Patient Details  Name: Pamela Smith MRN: [id number] Date of Birth: 08/09/1946  Transition of Care [facility]) CM/SW Contact  [place] L Ahlani Wickes, [place] Phone Number: 06/23/2024, 8:41 AM  Clinical Narrative:      CSW met with patient in the room. No family at bedside. CSW advised that per [employer], [facility], there is no bed availability at the facility in [place]. CSW advised that there is a bed available at [facility]. Patient is agreeable to being sent to [facility]. CSW will update the family.     8:51am: CSW spoke with Pamela Smith, sister. CSW advised that there is a bed at [facility] in [place], [place]. CSW advised that patient is agreeable to placement and wants to go.   CSW and Ms. Carrico discussed items needed for personal comfort while patient is receiving rehab. Sister stated that she had purchased a few items and they are in a cabinet in her room at [facility]. CSW advised that staff will ensure that patients belongings are with her when she is transported.                Expected Discharge Plan and Services                                               Social Drivers of Health (SDOH) Interventions SDOH Screenings   Food Insecurity: No Food Insecurity (06/20/2024)  Housing: Low Risk  (06/21/2024)  Transportation Needs: No Transportation Needs (06/20/2024)  Utilities: Not At Risk (06/20/2024)  Financial Resource Strain: Low Risk  (08/23/2023)   Received from [facility]  Social Connections: Socially Isolated (06/21/2024)  Tobacco Use: Medium Risk (06/20/2024)    Readmission Risk Interventions    09/24/2023   11:01 AM  Readmission Risk Prevention Plan  Post Dischage Appt Complete  Medication Screening Complete  Transportation Screening Complete

## 2024-06-23 NOTE — Progress Notes (Incomplete)
 {  [facility]_TRH_Note:[id number]}

## 2024-06-23 NOTE — TOC Transition Note (Signed)
 Transition of Care [facility]) - Discharge Note   Patient Details  Name: Pamela Smith MRN: [id number] Date of Birth: 12-Jun-1946  Transition of Care [facility]) CM/SW Contact:  [place] L Irbin Fines, LCSW Phone Number: 06/23/2024, 11:01 AM   Clinical Narrative:     CSW spoke with Ms. Batot, [facility], who confirmed bed for patient at [facility]. Transportation has been set up with [facility]. Sister, Slater, has been notified.   No further TOC needs identified. TOC signing off.         Patient Goals and CMS Choice            Discharge Placement                       Discharge Plan and Services Additional resources added to the After Visit Summary for                                       Social Drivers of Health (SDOH) Interventions SDOH Screenings   Food Insecurity: No Food Insecurity (06/20/2024)  Housing: Low Risk  (06/21/2024)  Transportation Needs: No Transportation Needs (06/20/2024)  Utilities: Not At Risk (06/20/2024)  Financial Resource Strain: Low Risk  (08/23/2023)   Received from [facility]  Social Connections: Socially Isolated (06/21/2024)  Tobacco Use: Medium Risk (06/20/2024)     Readmission Risk Interventions    09/24/2023   11:01 AM  Readmission Risk Prevention Plan  Post Dischage Appt Complete  Medication Screening Complete  Transportation Screening Complete

## 2024-06-23 NOTE — Discharge Summary (Signed)
 Physician Discharge Summary   Patient: Pamela Smith MRN: [id number] DOB: 17-Jan-1946  Admit date:     06/20/2024  Discharge date: 06/23/24  Discharge Physician: Amaryllis Dare   PCP: Sadie Manna, MD   Recommendations at discharge:  Please obtain CBC, CK and BMP on follow-up Follow-up with primary care provider within a week  Discharge Diagnoses: Principal Problem:   Stroke [facility]) Active Problems:   Frequent falls   Rhabdomyolysis   Diabetes mellitus without complication (HCC)   Hypertension   Dyslipidemia   Depression   Chronic renal disease, stage III (HCC)   Hypothyroidism   Stroke (cerebrum) [facility])   Hospital Course: Partly taken from H&P.  Pamela Smith is a 78 y.o. female with medical history significant of IDDM, HTN, hypothyroidism, HLD, GERD, chronic ambulation impairment, presented with frequent falls and generalized weakness.   Patient was seen in ED on 9/4 with significant hyperglycemia, apparently she was not taking any medications.  PT and OT did not recommend SNF at that time and patient also does not want to go to any facility so she was discharged back home.  There is also a concern of underlying dementia.  On presentation hemodynamically stable, labs with corrected sodium of 133, blood glucose of 357, T. bili of 1.4, creatinine at 1.22 which is around her baseline, leukocytosis at 15.6. CT head with a concern of acute to subacute infarct involving the right caudate head and adjacent portion of the anterior aspect of basal nuclei on the right.  Generalized cerebral atrophy with widening of extra-axial spaces and ventricular dilatation.  Patient was admitted for stroke workup.  9/7: Vital stable, MRI with concern of a subacute anterior right basal ganglia infarct, moderate chronic small vessel disease. MRA head and neck was motion degraded but negative for any LVO, moderate to severe stenosis in the right P2 segment.  A1c checked on 9/4 was 11.1, lipid panel  with elevated total cholesterol at 210, HDL 36, triglyceride 225 and LDL of 129.  CBG started improving and now at 173.  Patient was started on aspirin  and Lipitor.  Passed swallow evaluation.  Recent cognition evaluation is consistent with dementia. CK elevated at 3362, echocardiogram with normal EF and grade 1 diastolic dysfunction.  PT is recommending SNF MRI brain with contrast was ordered by neurology as her symptoms does not match with any acute stroke. Ordered some IV fluid for rhabdomyolysis.  9/8: Remained hemodynamically stable, MRI with contrast with a concern of subacute right basal ganglia lesion, neurology is recommending repeat imaging in 53-month to ensure expected evolution.  Carotid ultrasound with less than 50% stenosis and mild atherosclerotic plaque formation bilaterally.  B12 low at 238. Neurology is recommending aspirin  and Lipitor along with B12 supplement.  She need to follow-up with outpatient neurology for further assistance. CK improving now at 1504.  9/9: Hemodynamically stable, improving CK today at 914-patient should be encouraged p.o. hydration.  Renal function stable at baseline.  Patient is being discharged to SNF for further rehab before returning home.  She will continue on current medications and need to have a close follow-up with her providers for further assistance.  Assessment and Plan: * Stroke [facility]) MRI brain with concern of acute/subacute basal ganglia infarct but her symptoms are not consistent with any acute stroke.  Repeat MRI with contrast consistent with subacute right basal ganglia lesion with recommendation to repeat imaging in 2 months.  MRA head and neck was negative for any LVO.  Carotid ultrasound with less  than 50% stenosis.  Completed the stroke workup Started on baby aspirin -will continue -continue with Lipitor - PT is recommending SNF  Frequent falls Seems like progressive decline, cognition evaluation which was done on 9/4 with concern  of significant dementia.  Lives alone and unable to take care of herself. -PT/OT are recommending SNF  Rhabdomyolysis CK of 3362>>1504>>914, likely secondary to frequent falls and staying on floor for a long time. Received IV fluid and now encourage p.o. hydration  Diabetes mellitus without complication (HCC) Uncontrolled with hyperglycemia and A1c of 11.1 Patient was not taking her medications and insulin  regularly.  CBG elevated - Continue with Lantus  and SSI - Will resume home medications on discharge  Hypertension Blood pressure within goal. Continue with home medications  Dyslipidemia - Continue with Lipitor  Depression - Continue Wellbutrin   Chronic renal disease, stage III (HCC) Baseline creatinine seems to be around 1.1-1.2 Slight increase in creatinine in the beginning but not meeting the criteria of AKI, now at baseline -Monitor renal function - Avoid nephrotoxins  Hypothyroidism TSH level normal at 3.436 -Continue with Synthroid   Consultants: Neurology Procedures performed: None Disposition: Skilled nursing facility Diet recommendation:  Cardiac and Carb modified diet DISCHARGE MEDICATION: Allergies as of 06/23/2024       Reactions   Amlodipine    Carvedilol    Felodipine  Swelling   Greater than 5 mg   Lisinopril         Medication List     STOP taking these medications    hydrALAZINE  50 MG tablet Commonly known as: APRESOLINE    isosorbide  mononitrate 60 MG 24 hr tablet Commonly known as: IMDUR        TAKE these medications    acetaminophen  325 MG tablet Commonly known as: TYLENOL  Take 2 tablets (650 mg total) by mouth every 4 (four) hours as needed for mild pain (pain score 1-3) or fever (or temp > 37.5 C (99.5 F)).   albuterol  108 (90 Base) MCG/ACT inhaler Commonly known as: VENTOLIN  HFA Inhale 2 puffs into the lungs every 6 (six) hours as needed for wheezing.   aspirin  81 MG chewable tablet Chew by mouth daily.   atorvastatin  40  MG tablet Commonly known as: LIPITOR Take 40 mg by mouth daily.   buPROPion  150 MG 12 hr tablet Commonly known as: WELLBUTRIN  SR Take 150 mg by mouth 2 (two) times daily.   CALCIUM  WITH D3 PO Take 1 capsule by mouth 2 (two) times daily.   cyanocobalamin  1000 MCG tablet Commonly known as: VITAMIN B12 Take 1 tablet (1,000 mcg total) by mouth daily.   felodipine  10 MG 24 hr tablet Commonly known as: PLENDIL  Take 1 tablet (10 mg total) by mouth daily.   Fish Oil 1000 MG Caps Take 1 capsule by mouth 2 (two) times daily.   insulin  glargine 100 UNIT/ML injection Commonly known as: LANTUS  Inject 50 Units into the skin as directed.   levothyroxine  75 MCG tablet Commonly known as: SYNTHROID  Take 75 mcg by mouth daily before breakfast.   metFORMIN  1000 MG tablet Commonly known as: GLUCOPHAGE  Take 1,000 mg by mouth 2 (two) times daily with a meal.   metoprolol  tartrate 25 MG tablet Commonly known as: LOPRESSOR  Take 25 mg by mouth 2 (two) times daily.   omeprazole 40 MG capsule Commonly known as: PRILOSEC Take 40 mg by mouth daily.   OneTouch Verio test strip Generic drug: glucose blood 1 each 2 (two) times daily.   sitaGLIPtin 100 MG tablet Commonly known as: JANUVIA  Take 100 mg by mouth daily.        Contact information for follow-up providers     Sadie Manna, MD Follow up.   Specialty: Internal Medicine Why: hospital follow up Contact information: [address] [place] [address] [phone]              Contact information for after-discharge care     Destination     [facility] .   Service: Skilled Nursing Contact information: [address] [place]  [address] [phone]                    Discharge Exam: Pamela Smith   06/20/24 1157  Weight: 72 kg   General.  Frail elderly lady, in no acute distress. Pulmonary.  Lungs clear bilaterally, normal  respiratory effort. CV.  Regular rate and rhythm, no JVD, rub or murmur. Abdomen.  Soft, nontender, nondistended, BS positive. CNS.  Alert and oriented .  No focal neurologic deficit. Extremities.  No edema, no cyanosis, pulses intact and symmetrical. Psychiatry.  Judgment and insight appears normal.   Condition at discharge: stable  The results of significant diagnostics from this hospitalization (including imaging, microbiology, ancillary and laboratory) are listed below for reference.   Imaging Studies: MR BRAIN W CONTRAST Result Date: 06/21/2024 CLINICAL DATA:  Stroke suspected. EXAM: MRI HEAD WITH CONTRAST TECHNIQUE: Multiplanar, multiecho pulse sequences of the brain and surrounding structures were obtained with intravenous contrast. CONTRAST:  7mL GADAVIST  GADOBUTROL  1 MMOL/ML IV SOLN COMPARISON:  Head MRI without contrast 06/20/2024 FINDINGS: Multiple sequences are mildly to moderately motion degraded. The 3 cm region of signal abnormality in the anterior right basal ganglia demonstrates diffuse peripheral enhancement and more heterogeneous enhancement centrally including a paucity of central enhancement in the more superomedial component. No abnormal intracranial enhancement is identified elsewhere. IMPRESSION: 1. Heterogeneous enhancement of the right basal ganglia lesion, greatest peripherally and again favored to reflect a subacute infarct. Consider follow-up imaging in 2 months to ensure expected evolution and exclude less likely alternative etiologies including neoplasm. 2. No other abnormal intracranial enhancement. Electronically Signed   By: Dasie Hamburg M.D.   On: 06/21/2024 16:37   US  Carotid Bilateral Result Date: 06/21/2024 CLINICAL DATA:  78 year old female with history of stroke. EXAM: BILATERAL CAROTID DUPLEX ULTRASOUND TECHNIQUE: Elnor scale imaging, color Doppler and duplex ultrasound were performed of bilateral carotid and vertebral arteries in the neck. COMPARISON:  None  Available. FINDINGS: Criteria: Quantification of carotid stenosis is based on velocity parameters that correlate the residual internal carotid diameter with NASCET-based stenosis levels, using the diameter of the distal internal carotid lumen as the denominator for stenosis measurement. The following velocity measurements were obtained: RIGHT ICA: Peak systolic velocity 106 cm/sec, End diastolic velocity 27 cm/sec CCA: Peak systolic velocity 71 cm/sec SYSTOLIC ICA/CCA RATIO:  1.5 ECA: Peak systolic velocity 98 cm/sec LEFT ICA: Peak systolic velocity 116 cm/sec, End diastolic velocity 22 cm/sec CCA: 72 cm/sec SYSTOLIC ICA/CCA RATIO:  1.6 ECA: 78 cm/sec RIGHT CAROTID ARTERY: Mild multifocal atherosclerotic plaque formation most prominent about the bifurcation. No significant tortuosity. Normal low resistance waveforms. RIGHT VERTEBRAL ARTERY:  Antegrade flow. LEFT CAROTID ARTERY: Mild multifocal atherosclerotic plaque formation most prominent at the carotid bulb and bifurcation. No significant tortuosity. Normal low resistance waveforms. LEFT VERTEBRAL ARTERY:  Antegrade flow. Upper extremity non-invasive blood pressures: Not obtained. IMPRESSION: 1. Right carotid artery system: Less than 50% stenosis secondary to mild multifocal atherosclerotic plaque  formation. 2. Left carotid artery system: Less than 50% stenosis secondary to mild multifocal atherosclerotic plaque formation. 3.  Vertebral artery system: Patent with antegrade flow bilaterally. Ester Sides, MD [facility] Radiology Electronically Signed   By: Ester Sides M.D.   On: 06/21/2024 14:44   ECHOCARDIOGRAM COMPLETE Result Date: 06/21/2024    ECHOCARDIOGRAM REPORT   Patient Name:   Pamela Smith Date of Exam: 06/21/2024 Medical Rec #:  [id number]       Height:       63.0 in Accession #:    [id number]      Weight:       158.7 lb Date of Birth:  October 26, 1945       BSA:          1.753 m Patient Age:    78 years         BP:           122/72 mmHg Patient Gender: F               HR:           97 bpm. Exam Location:  [facility] Procedure: 2D Echo, Cardiac Doppler and Color Doppler (Both Spectral and Color            Flow Doppler were utilized during procedure). Indications:     Stroke  History:         Patient has no prior history of Echocardiogram examinations.                  Risk Factors:Hypertension, Diabetes and Dyslipidemia.  Sonographer:     Philomena Daring Referring Phys:  [id number] CORT ONEIDA MANA Diagnosing Phys: Denyse Bathe IMPRESSIONS  1. Left ventricular ejection fraction, by estimation, is 60 to 65%. The left ventricle has normal function. The left ventricle has no regional wall motion abnormalities. There is moderate concentric left ventricular hypertrophy. Left ventricular diastolic parameters are consistent with Grade I diastolic dysfunction (impaired relaxation).  2. Right ventricular systolic function is normal. The right ventricular size is normal.  3. The mitral valve is normal in structure. Trivial mitral valve regurgitation. No evidence of mitral stenosis.  4. The aortic valve is normal in structure. Aortic valve regurgitation is not visualized. Aortic valve sclerosis/calcification is present, without any evidence of aortic stenosis.  5. The inferior vena cava is normal in size with greater than 50% respiratory variability, suggesting right atrial pressure of 3 mmHg. FINDINGS  Left Ventricle: Left ventricular ejection fraction, by estimation, is 60 to 65%. The left ventricle has normal function. The left ventricle has no regional wall motion abnormalities. Strain was performed and the global longitudinal strain is indeterminate. The left ventricular internal cavity size was normal in size. There is moderate concentric left ventricular hypertrophy. Left ventricular diastolic parameters are consistent with Grade I diastolic dysfunction (impaired relaxation). Right Ventricle: The right ventricular size is normal. No  increase in right ventricular wall thickness. Right ventricular systolic function is normal. Left Atrium: Left atrial size was normal in size. Right Atrium: Right atrial size was normal in size. Pericardium: There is no evidence of pericardial effusion. Mitral Valve: The mitral valve is normal in structure. Trivial mitral valve regurgitation. No evidence of mitral valve stenosis. Tricuspid Valve: The tricuspid valve is normal in structure. Tricuspid valve regurgitation is trivial. No evidence of tricuspid stenosis. Aortic Valve: The aortic valve is normal in structure. Aortic valve regurgitation is not visualized. Aortic valve sclerosis/calcification is present, without  any evidence of aortic stenosis. Pulmonic Valve: The pulmonic valve was normal in structure. Pulmonic valve regurgitation is not visualized. No evidence of pulmonic stenosis. Aorta: The aortic root is normal in size and structure. Venous: The inferior vena cava is normal in size with greater than 50% respiratory variability, suggesting right atrial pressure of 3 mmHg. IAS/Shunts: No atrial level shunt detected by color flow Doppler. Additional Comments: 3D was performed not requiring image post processing on an independent workstation and was indeterminate.  LEFT VENTRICLE PLAX 2D LVIDd:         3.60 cm   Diastology LVIDs:         2.40 cm   LV e' medial:  3.59 cm/s LV PW:         1.40 cm   LV e' lateral: 5.33 cm/s LV IVS:        1.30 cm LVOT diam:     1.80 cm LV SV:         56 LV SV Index:   32 LVOT Area:     2.54 cm  RIGHT VENTRICLE             IVC RV S prime:     11.70 cm/s  IVC diam: 1.30 cm TAPSE (M-mode): 1.8 cm LEFT ATRIUM             Index        RIGHT ATRIUM           Index LA diam:        3.60 cm 2.05 cm/m   RA Area:     10.20 cm LA Vol (A2C):   51.5 ml 29.38 ml/m  RA Volume:   17.10 ml  9.76 ml/m LA Vol (A4C):   49.4 ml 28.18 ml/m LA Biplane Vol: 54.7 ml 31.21 ml/m  AORTIC VALVE LVOT Vmax:   127.00 cm/s LVOT Vmean:  86.900 cm/s LVOT  VTI:    0.222 m  AORTA Ao Root diam: 2.60 cm TRICUSPID VALVE TR Peak grad:   6.2 mmHg TR Vmax:        125.00 cm/s  SHUNTS Systemic VTI:  0.22 m Systemic Diam: 1.80 cm Denyse Bathe Electronically signed by Denyse Bathe Signature Date/Time: 06/21/2024/11:01:30 AM    Final    MR BRAIN WO CONTRAST Result Date: 06/20/2024 CLINICAL DATA:  Neuro deficit, acute, stroke suspected; Stroke, follow up. Fall. Generalized weakness. EXAM: MRI HEAD WITHOUT CONTRAST MRA HEAD WITHOUT CONTRAST MRA NECK WITHOUT CONTRAST TECHNIQUE: Multiplanar, multiecho pulse sequences of the brain and surrounding structures were obtained without intravenous contrast. Angiographic images of the Circle of Willis were obtained using MRA technique without intravenous contrast. Angiographic images of the neck were obtained using MRA technique without intravenous contrast. Carotid stenosis measurements (when applicable) are obtained utilizing NASCET criteria, using the distal internal carotid diameter as the denominator. COMPARISON:  Head CT 06/20/2024 and MRI 09/24/2023 FINDINGS: MRI HEAD FINDINGS Brain: A 3 cm region of heterogeneous diffusion weighted and T2 signal in the anterior right basal ganglia corresponds to the circumscribed hypodensity on CT and is most compatible with a subacute infarct. There is mild swelling and mass effect on the frontal horn of the right lateral ventricle. Mild susceptibility and intrinsic T1 hyperintensity in this region are compatible with small volume nonacute blood products. Patchy T2 hyperintensities elsewhere in the cerebral white matter bilaterally are similar to the prior MRI and are nonspecific but compatible with moderate chronic small vessel ischemic disease. There is mild cerebral atrophy. A chronic  lacunar infarct is again noted in the left frontal white matter. No midline shift, extra-axial fluid collection, or hydrocephalus is evident. Vascular: Major intracranial vascular flow voids are preserved. Skull and  upper cervical spine: Unremarkable bone marrow signal. Sinuses/Orbits: Unremarkable orbits. Paranasal sinuses and mastoid air cells are clear. Other: None. MRA HEAD FINDINGS The study is mildly motion degraded. The included portions of the intracranial vertebral arteries are patent to the basilar with the left being strongly dominant. The basilar artery is widely patent. There are patent posterior communicating arteries bilaterally. Both PCAs are patent without evidence a significant proximal stenosis on the left. There is a moderate to severe stenosis versus artifactual signal loss in the mid right P2 segment. The internal carotid arteries are widely patent from skull base to carotid termini. ACAs and MCAs are patent without evidence of a proximal branch occlusion or significant proximal stenosis. No aneurysm is identified. MRA NECK FINDINGS Evaluation is severely limited by motion artifact and noncontrast technique. The brachiocephalic and subclavian arteries are poorly evaluated. The common carotid and cervical internal carotid arteries are patent bilaterally. Assessment of the proximal left common carotid artery and of the left carotid bifurcation region/left ICA origin is particularly limited, and stenosis cannot be adequately evaluated for in these regions. No flow limiting stenosis is suspected on the right. Assessment of the vertebral arteries is limited, including nondiagnostic assessment of the proximal V1 segments, however antegrade flow related enhancement is evident in the distal V1 through V3 segments with the left vertebral artery being dominant. IMPRESSION: 1. Signal abnormality in the anterior right basal ganglia most compatible with a subacute infarct. 2. Moderate chronic small vessel ischemic disease. 3. Motion degraded head MRA with a moderate to severe stenosis versus artifact in the right P2 segment. No evidence of a significant proximal stenosis elsewhere in the anterior or posterior  intracranial circulation. 4. Severely limited neck MRA. Grossly patent cervical carotid and vertebral arteries. Electronically Signed   By: Dasie Hamburg M.D.   On: 06/20/2024 16:30   MR ANGIO HEAD WO CONTRAST Result Date: 06/20/2024 CLINICAL DATA:  Neuro deficit, acute, stroke suspected; Stroke, follow up. Fall. Generalized weakness. EXAM: MRI HEAD WITHOUT CONTRAST MRA HEAD WITHOUT CONTRAST MRA NECK WITHOUT CONTRAST TECHNIQUE: Multiplanar, multiecho pulse sequences of the brain and surrounding structures were obtained without intravenous contrast. Angiographic images of the Circle of Willis were obtained using MRA technique without intravenous contrast. Angiographic images of the neck were obtained using MRA technique without intravenous contrast. Carotid stenosis measurements (when applicable) are obtained utilizing NASCET criteria, using the distal internal carotid diameter as the denominator. COMPARISON:  Head CT 06/20/2024 and MRI 09/24/2023 FINDINGS: MRI HEAD FINDINGS Brain: A 3 cm region of heterogeneous diffusion weighted and T2 signal in the anterior right basal ganglia corresponds to the circumscribed hypodensity on CT and is most compatible with a subacute infarct. There is mild swelling and mass effect on the frontal horn of the right lateral ventricle. Mild susceptibility and intrinsic T1 hyperintensity in this region are compatible with small volume nonacute blood products. Patchy T2 hyperintensities elsewhere in the cerebral white matter bilaterally are similar to the prior MRI and are nonspecific but compatible with moderate chronic small vessel ischemic disease. There is mild cerebral atrophy. 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 No midline shift, extra-axial fluid collection, or hydrocephalus is evident. Vascular: Major intracranial vascular flow voids are preserved. Skull and upper cervical spine: Unremarkable bone marrow signal. Sinuses/Orbits: Unremarkable  orbits. Paranasal sinuses and mastoid air cells are clear. Other: None. MRA HEAD FINDINGS The study is mildly motion degraded. The included portions of the intracranial vertebral arteries are patent to the basilar with the left being strongly dominant. The basilar artery is widely patent. There are patent posterior communicating arteries bilaterally. Both PCAs are patent without evidence a significant proximal stenosis on the left. There is a moderate to severe stenosis versus artifactual signal loss in the mid right P2 segment. The internal carotid arteries are widely patent from skull base to carotid termini. ACAs and MCAs are patent without evidence of a proximal branch occlusion or significant proximal stenosis. No aneurysm is identified. MRA NECK FINDINGS Evaluation is severely limited by motion artifact and noncontrast technique. The brachiocephalic and subclavian arteries are poorly evaluated. The common carotid and cervical internal carotid arteries are patent bilaterally. Assessment of the proximal left common carotid artery and of the left carotid bifurcation region/left ICA origin is particularly limited, and stenosis cannot be adequately evaluated for in these regions. No flow limiting stenosis is suspected on the right. Assessment of the vertebral arteries is limited, including nondiagnostic assessment of the proximal V1 segments, however antegrade flow related enhancement is evident in the distal V1 through V3 segments with the left vertebral artery being dominant. IMPRESSION: 1. Signal abnormality in the anterior right basal ganglia most compatible with a subacute infarct. 2. Moderate chronic small vessel ischemic disease. 3. Motion degraded head MRA with a moderate to severe stenosis versus artifact in the right P2 segment. No evidence of a significant proximal stenosis elsewhere in the anterior or posterior intracranial circulation. 4. Severely limited neck MRA. Grossly patent cervical carotid and  vertebral arteries. Electronically Signed   By: Dasie Hamburg M.D.   On: 06/20/2024 16:30   MR ANGIO NECK WO CONTRAST Result Date: 06/20/2024 CLINICAL DATA:  Neuro deficit, acute, stroke suspected; Stroke, follow up. Fall. Generalized weakness. EXAM: MRI HEAD WITHOUT CONTRAST MRA HEAD WITHOUT CONTRAST MRA NECK WITHOUT CONTRAST TECHNIQUE: Multiplanar, multiecho pulse sequences of the brain and surrounding structures were obtained without intravenous contrast. Angiographic images of the Circle of Willis were obtained using MRA technique without intravenous contrast. Angiographic images of the neck were obtained using MRA technique without intravenous contrast. Carotid stenosis measurements (when applicable) are obtained utilizing NASCET criteria, using the distal internal carotid diameter as the denominator. COMPARISON:  Head CT 06/20/2024 and MRI 09/24/2023 FINDINGS: MRI HEAD FINDINGS Brain: A 3 cm region of heterogeneous diffusion weighted and T2 signal in the anterior right basal ganglia corresponds to the circumscribed hypodensity on CT and is most compatible with a subacute infarct. There is mild swelling and mass effect on the frontal horn of the right lateral ventricle. Mild susceptibility and intrinsic T1 hyperintensity in this region are compatible with small volume nonacute blood products. Patchy T2 hyperintensities elsewhere in the cerebral white matter bilaterally are similar to the prior MRI and are nonspecific but compatible with moderate chronic small vessel ischemic disease. There is mild cerebral atrophy. 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 No midline shift, extra-axial fluid collection, or hydrocephalus is evident. Vascular: Major intracranial vascular flow voids are preserved. Skull and upper cervical spine: Unremarkable bone marrow signal. Sinuses/Orbits: Unremarkable orbits. Paranasal sinuses and mastoid air cells are clear. Other: None. MRA HEAD FINDINGS  The study is mildly motion degraded. The included portions of the intracranial vertebral arteries are patent to the basilar with the left being strongly dominant. The basilar  artery is widely patent. There are patent posterior communicating arteries bilaterally. Both PCAs are patent without evidence a significant proximal stenosis on the left. There is a moderate to severe stenosis versus artifactual signal loss in the mid right P2 segment. The internal carotid arteries are widely patent from skull base to carotid termini. ACAs and MCAs are patent without evidence of a proximal branch occlusion or significant proximal stenosis. No aneurysm is identified. MRA NECK FINDINGS Evaluation is severely limited by motion artifact and noncontrast technique. The brachiocephalic and subclavian arteries are poorly evaluated. The common carotid and cervical internal carotid arteries are patent bilaterally. Assessment of the proximal left common carotid artery and of the left carotid bifurcation region/left ICA origin is particularly limited, and stenosis cannot be adequately evaluated for in these regions. No flow limiting stenosis is suspected on the right. Assessment of the vertebral arteries is limited, including nondiagnostic assessment of the proximal V1 segments, however antegrade flow related enhancement is evident in the distal V1 through V3 segments with the left vertebral artery being dominant. IMPRESSION: 1. Signal abnormality in the anterior right basal ganglia most compatible with a subacute infarct. 2. Moderate chronic small vessel ischemic disease. 3. Motion degraded head MRA with a moderate to severe stenosis versus artifact in the right P2 segment. No evidence of a significant proximal stenosis elsewhere in the anterior or posterior intracranial circulation. 4. Severely limited neck MRA. Grossly patent cervical carotid and vertebral arteries. Electronically Signed   By: Dasie Hamburg M.D.   On: 06/20/2024 16:30    DG Hand Complete Right Result Date: 06/20/2024 CLINICAL DATA:  Status post fall. EXAM: RIGHT HAND - COMPLETE 3+ VIEW COMPARISON:  None Available. FINDINGS: There is no evidence of fracture or dislocation. Degenerative changes are seen involving the first carpometacarpal joint. Soft tissues are unremarkable. IMPRESSION: Degenerative changes without evidence of an acute osseous abnormality. Electronically Signed   By: Suzen Dials M.D.   On: 06/20/2024 12:36   CT Head Wo Contrast Result Date: 06/20/2024 CLINICAL DATA:  Patient found down. EXAM: CT HEAD WITHOUT CONTRAST TECHNIQUE: Contiguous axial images were obtained from the base of the skull through the vertex without intravenous contrast. RADIATION DOSE REDUCTION: This exam was performed according to the departmental dose-optimization program which includes automated exposure control, adjustment of the mA and/or kV according to patient size and/or use of iterative reconstruction technique. COMPARISON:  September 23, 2023 FINDINGS: Brain: There is generalized cerebral atrophy with widening of the extra-axial spaces and ventricular dilatation. There are areas of decreased attenuation within the white matter tracts of the supratentorial brain, consistent with microvascular disease changes. And area of white matter low attenuation is seen involving the right caudate head and adjacent portion of the anterior aspect of the basal nuclei on the right. This represents a new finding when compared to the prior study. There is no associated midline shift. Vascular: Moderate to marked severity bilateral cavernous carotid artery calcification is noted. Skull: Normal. Negative for fracture or focal lesion. Sinuses/Orbits: No acute finding. Other: There is mild right frontal scalp soft tissue swelling. IMPRESSION: 1. Generalized cerebral atrophy with widening of the extra-axial spaces and ventricular dilatation. 2. Findings consistent with an acute to subacute infarct  involving the right caudate head and adjacent portion of the anterior aspect of the basal nuclei on the right. MRI correlation is recommended. 3. Mild right frontal scalp soft tissue swelling. Electronically Signed   By: Suzen Dials M.D.   On: 06/20/2024 12:31  Microbiology: Results for orders placed or performed during the hospital encounter of 08/14/19  SARS CORONAVIRUS 2 (TAT 6-24 HRS) Nasopharyngeal Nasopharyngeal Swab     Status: None   Collection Time: 08/14/19 12:32 PM   Specimen: Nasopharyngeal Swab  Result Value Ref Range Status   SARS Coronavirus 2 NEGATIVE NEGATIVE Final    Comment: (NOTE) SARS-CoV-2 target nucleic acids are NOT DETECTED. The SARS-CoV-2 RNA is generally detectable in upper and lower respiratory specimens during the acute phase of infection. Negative results do not preclude SARS-CoV-2 infection, do not rule out co-infections with other pathogens, and should not be used as the sole basis for treatment or other patient management decisions. Negative results must be combined with clinical observations, patient history, and epidemiological information. The expected result is Negative. Fact Sheet for Patients: HairSlick.no Fact Sheet for Healthcare Providers: quierodirigir.com This test is not yet approved or cleared by the [place]  FDA and  has been authorized for detection and/or diagnosis of SARS-CoV-2 by FDA under an Emergency Use Authorization (EUA). This EUA will remain  in effect (meaning this test can be used) for the duration of the COVID-19 declaration under Section 56 4(b)(1) of the Act, 21 U.S.C. section 360bbb-3(b)(1), unless the authorization is terminated or revoked sooner. Performed at [facility], [address] [place], [place] [address]     Labs: CBC: Recent Labs  Lab 06/18/24 1802 06/20/24 1221 06/21/24 0937 06/22/24 0309  WBC 11.4* 15.6* 11.3* 9.6   NEUTROABS 7.2  --   --   --   HGB 11.1* 12.9 12.9 11.2*  HCT 34.8* 39.3 40.4 34.6*  MCV 87.2 84.5 84.9 85.4  PLT 228 295 304 229   Basic Metabolic Panel: Recent Labs  Lab 06/18/24 1511 06/20/24 1221 06/21/24 0937 06/22/24 0309 06/23/24 0340  NA 132* 129* 135 138 139  K 4.6 4.3 3.7 3.6 3.8  CL 94* 94* 98 104 103  CO2 25 22 22 24 25   GLUCOSE 591* 357* 170* 168* 179*  BUN 16 21 21 21 17   CREATININE 1.21* 1.22* 1.34* 1.21* 1.24*  CALCIUM  9.1 9.2 9.1 8.8* 9.0  MG 1.6*  --   --  2.0  --   PHOS  --   --   --  3.6  --    Liver Function Tests: Recent Labs  Lab 06/18/24 1511 06/20/24 1221 06/21/24 0937 06/22/24 0309  AST 48* 48* 73*  --   ALT 43 39 38  --   ALKPHOS 63 66 59  --   BILITOT 0.9 1.4* 1.0  --   PROT 6.5 7.2 6.6  --   ALBUMIN 3.4* 3.5 3.3* 2.8*   CBG: Recent Labs  Lab 06/22/24 0733 06/22/24 1118 06/22/24 1654 06/22/24 2101 06/23/24 0807  GLUCAP 177* 317* 209* 214* 170*    Discharge time spent: greater than 30 minutes.  This record has been created using Conservation officer, historic buildings. Errors have been sought and corrected,but may not always be located. Such creation errors do not reflect on the standard of care.   Signed: Amaryllis Dare, MD [facility] 06/23/2024

## 2024-06-23 NOTE — Plan of Care (Signed)
  Problem: Education: Goal: Ability to describe self-care measures that may prevent or decrease complications (Diabetes Survival Skills Education) will improve Outcome: Progressing Goal: Individualized Educational Video(s) Outcome: Progressing   Problem: Coping: Goal: Ability to adjust to condition or change in health will improve Outcome: Progressing   Problem: Fluid Volume: Goal: Ability to maintain a balanced intake and output will improve Outcome: Progressing   Problem: Metabolic: Goal: Ability to maintain appropriate glucose levels will improve Outcome: Progressing   Problem: Nutritional: Goal: Maintenance of adequate nutrition will improve Outcome: Progressing Goal: Progress toward achieving an optimal weight will improve Outcome: Progressing   Problem: Skin Integrity: Goal: Risk for impaired skin integrity will decrease Outcome: Progressing   Problem: Tissue Perfusion: Goal: Adequacy of tissue perfusion will improve Outcome: Progressing   Problem: Education: Goal: Knowledge of disease or condition will improve Outcome: Progressing Goal: Knowledge of secondary prevention will improve (MUST DOCUMENT ALL) Outcome: Progressing Goal: Knowledge of patient specific risk factors will improve (DELETE if not current risk factor) Outcome: Progressing   Problem: Ischemic Stroke/TIA Tissue Perfusion: Goal: Complications of ischemic stroke/TIA will be minimized Outcome: Progressing   Problem: Coping: Goal: Will verbalize positive feelings about self Outcome: Progressing Goal: Will identify appropriate support needs Outcome: Progressing   Problem: Health Behavior/Discharge Planning: Goal: Ability to manage health-related needs will improve Outcome: Progressing Goal: Goals will be collaboratively established with patient/family Outcome: Progressing   Problem: Self-Care: Goal: Ability to participate in self-care as condition permits will improve Outcome:  Progressing Goal: Verbalization of feelings and concerns over difficulty with self-care will improve Outcome: Progressing Goal: Ability to communicate needs accurately will improve Outcome: Progressing   Problem: Education: Goal: Knowledge of General Education information will improve Description: Including pain rating scale, medication(s)/side effects and non-pharmacologic comfort measures Outcome: Progressing   Problem: Health Behavior/Discharge Planning: Goal: Ability to manage health-related needs will improve Outcome: Progressing   Problem: Clinical Measurements: Goal: Ability to maintain clinical measurements within normal limits will improve Outcome: Progressing Goal: Will remain free from infection Outcome: Progressing Goal: Diagnostic test results will improve Outcome: Progressing Goal: Respiratory complications will improve Outcome: Progressing Goal: Cardiovascular complication will be avoided Outcome: Progressing   Problem: Activity: Goal: Risk for activity intolerance will decrease Outcome: Progressing   Problem: Nutrition: Goal: Adequate nutrition will be maintained Outcome: Progressing   Problem: Coping: Goal: Level of anxiety will decrease Outcome: Progressing   Problem: Elimination: Goal: Will not experience complications related to bowel motility Outcome: Progressing Goal: Will not experience complications related to urinary retention Outcome: Progressing

## 2024-06-23 NOTE — Progress Notes (Signed)
 Occupational Therapy Treatment Patient Details Name: Pamela Smith MRN: [id number] DOB: 04-29-1946 Today's Date: 06/23/2024   History of present illness Pt is a 78 year old female admitted after fall, MRI showing  Signal abnormality in the anterior right basal ganglia most  compatible with a subacute infarct.      PMH significant for IDDM, HTN, hypothyroidism, HLD, GERD, chronic ambulation impairment; of note, pt was in ED on 9/5   OT comments  Pt seen for OT tx. Pt agreeable and A&Ox4. Pt required supv for bed mobility, CGA for ADL transfers and mobility with RW. Pt ambulated in hall with therapist, requiring CGA with RW and intermittent VC for safety wiht RW to avoid obstacles. Twice RW ran into obstacles but no overt LOB noted. Pt tolerated dual tasking with mobility and situational safety scenarios, with 100% accuracy noted (how to put out grease fire, who to call in emergency, what to do if you believe you're having a medical emergency, etc). Pt able to verbalize current routine for med mgt with medications and groceries delivered. She keeps medications in same place, uses pill boxes for her pre breakfast meds, post breakfast meds, and evening meds. Pt denies difficulty with managing. No family present to verify however. Pt continues to benefit from skilled OT services.       If plan is discharge home, recommend the following:  A little help with bathing/dressing/bathroom;Assist for transportation;Assistance with cooking/housework;Direct supervision/assist for medications management;Direct supervision/assist for financial management;Supervision due to cognitive status   Equipment Recommendations  BSC/3in1    Recommendations for Other Services      Precautions / Restrictions Precautions Precautions: Fall Recall of Precautions/Restrictions: Impaired Restrictions Weight Bearing Restrictions Per Provider Order: No       Mobility Bed Mobility Overal bed mobility: Needs Assistance Bed  Mobility: Supine to Sit, Sit to Supine     Supine to sit: Supervision Sit to supine: Supervision        Transfers Overall transfer level: Needs assistance Equipment used: Rolling walker (2 wheels) Transfers: Sit to/from Stand Sit to Stand: Contact guard assist           General transfer comment: VC for hand placement     Balance Overall balance assessment: Needs assistance, History of Falls Sitting-balance support: Feet supported Sitting balance-Leahy Scale: Good     Standing balance support: Bilateral upper extremity supported, During functional activity Standing balance-Leahy Scale: Fair                             ADL either performed or assessed with clinical judgement   ADL Overall ADL's : Needs assistance/impaired                 Upper Body Dressing : Sitting;Minimal assistance   Lower Body Dressing: Minimal assistance;Sitting/lateral leans Lower Body Dressing Details (indicate cue type and reason): adjust socks             Functional mobility during ADLs: Contact guard assist;Rolling walker (2 wheels);Cueing for safety;Cueing for sequencing      Extremity/Trunk Assessment              Vision       Perception     Praxis     Communication Communication Communication: No apparent difficulties   Cognition Arousal: Alert Behavior During Therapy: WFL for tasks assessed/performed, Flat affect Cognition: Cognition impaired       Memory impairment (select all impairments): Working [employer] fast streamer, Short-term memory  Executive functioning impairment (select all impairments): Problem solving, Reasoning OT - Cognition Comments: pt alert and oriented x4, demo's some impaired safety awareness and memory deficits                 Following commands: Impaired Following commands impaired: Follows one step commands with increased time, Follows multi-step commands with increased time      Cueing   Cueing Techniques: Verbal cues,  Tactile cues, Visual cues  Exercises Other Exercises Other Exercises: Pt ambulated in hall with therapist, requiring CGA with RW and intermittent VC for safety wiht RW to avoid obstacles. Twice RW ran into obstacles but no overt LOB noted. Pt tolerated dual tasking with mobility and situational safety scenarios, with 100% accuracy noted (how to put out grease fire, who to call in emergency, what to do if you believe you're having a medical emergency, etc). Other Exercises: Pt able to verbalize current routine for med mgt with medications and groceries delivered.    Shoulder Instructions       General Comments      Pertinent Vitals/ Pain       Pain Assessment Pain Assessment: No/denies pain  Home Living                                          Prior Functioning/Environment              Frequency  Min 2X/week        Progress Toward Goals  OT Goals(current goals can now be found in the care plan section)  Progress towards OT goals: Progressing toward goals  Acute Rehab OT Goals Patient Stated Goal: improve function OT Goal Formulation: With patient Time For Goal Achievement: 07/03/24 Potential to Achieve Goals: Good  Plan      Co-evaluation                 AM-PAC OT 6 Clicks Daily Activity     Outcome Measure   Help from another person eating meals?: None Help from another person taking care of personal grooming?: None Help from another person toileting, which includes using toliet, bedpan, or urinal?: A Little Help from another person bathing (including washing, rinsing, drying)?: A Little Help from another person to put on and taking off regular upper body clothing?: A Little Help from another person to put on and taking off regular lower body clothing?: A Little 6 Click Score: 20    End of Session Equipment Utilized During Treatment: Rolling walker (2 wheels);Gait belt  OT Visit Diagnosis: Other abnormalities of gait and mobility  (R26.89);Other symptoms and signs involving cognitive function   Activity Tolerance Patient tolerated treatment well   Patient Left in bed;with call bell/phone within reach;with bed alarm set   Nurse Communication          Time: 9096-9073 OT Time Calculation (min): 23 min  Charges: OT General Charges $OT Visit: 1 Visit OT Treatments $Therapeutic Activity: 23-37 mins  Warren SAUNDERS., MPH, MS, OTR/L ascom [phone] 06/23/24, 9:56 AM

## 2024-06-23 NOTE — Inpatient Diabetes Management (Signed)
 Inpatient Diabetes Program Recommendations  AACE/ADA: New Consensus Statement on Inpatient Glycemic Control   Target Ranges:  Prepandial:   less than 140 mg/dL      Peak postprandial:   less than 180 mg/dL (1-2 hours)      Critically ill patients:  140 - 180 mg/dL    Latest Reference Range & Units 06/22/24 07:33 06/22/24 11:18 06/22/24 16:54 06/22/24 21:01 06/23/24 08:07  Glucose-Capillary 70 - 99 mg/dL 822 (H) 682 (H) 790 (H) 214 (H) 170 (H)   Review of Glycemic Control  Diabetes history: DM2 Outpatient Diabetes medications: Lantus  50 units daily, Metformin  100mg  BID, and Januvia 100mg  daily.  Current orders for Inpatient glycemic control: Novolog  0-15 units TID, Novolog  0-5 units at bedtime, Novolog  5 units TID with meals, and Lantus  40 units at bedtime.   Inpatient Diabetes Program Recommendations:   Insulin : Please consider increasing Novolog  meal coverage to 7 units TID with meals.   Thanks,  Lavanda Search, RN, MSN, [facility]  Inpatient Diabetes Coordinator  Pager [phone] (8a-5p)

## 2024-06-23 NOTE — Care Management Important Message (Signed)
 Important Message  Patient Details  Name: Pamela Smith MRN: [id number] Date of Birth: Jan 06, 1946   Important Message Given:  Yes - Medicare IM     Mirjana Tarleton W, CMA 06/23/2024, 11:35 AM

## 2024-06-23 NOTE — Inpatient Diabetes Management (Signed)
 Inpatient Diabetes Program Recommendations  AACE/ADA: New Consensus Statement on Inpatient Glycemic Control   Target Ranges:  Prepandial:   less than 140 mg/dL      Peak postprandial:   less than 180 mg/dL (1-2 hours)      Critically ill patients:  140 - 180 mg/dL    Latest Reference Range & Units 06/22/24 07:33 06/22/24 11:18 06/22/24 16:54 06/22/24 21:01 06/23/24 08:07  Glucose-Capillary 70 - 99 mg/dL 822 (H) 682 (H) 790 (H) 214 (H) 170 (H)    Latest Reference Range & Units 06/18/24 18:02  Hemoglobin A1C 4.8 - 5.6 % 11.1 (H)   Review of Glycemic Control  Diabetes history: DM2 Outpatient Diabetes medications: Lantus  50 units daily, Metformin  100mg  BID, and Januvia 100mg  daily.  Current orders for Inpatient glycemic control: Novolog  0-15 units TID, Novolog  0-5 units at bedtime, Novolog  5 units TID with meals, and Lantus  40 units at bedtime  NOTE: Went by room to talk with patient about A1C of 11.1% but patient has already been discharged to SNF. RN reports that patient was not oriented and would not have been appropriate for education anyway. RN reports that patient likely has dementia and may need memory care unit long term.   Thanks, Earnie Gainer, RN, MSN, [facility] Diabetes Coordinator Inpatient Diabetes Program [phone] (Team Pager from 8am to 5pm)

## 2024-06-25 ENCOUNTER — Encounter: Payer: Self-pay | Admitting: Oncology

## 2024-07-24 ENCOUNTER — Encounter: Payer: Self-pay | Admitting: Oncology
